# Patient Record
Sex: Female | Born: 1940 | Race: Black or African American | Hispanic: No | Marital: Married | Smoking: Former smoker
Health system: Southern US, Community
[De-identification: ages and names within clinical notes are randomized; demographics above are authoritative.]

## PROBLEM LIST (undated history)

## (undated) DIAGNOSIS — N289 Disorder of kidney and ureter, unspecified: Secondary | ICD-10-CM

## (undated) DIAGNOSIS — I10 Essential (primary) hypertension: Secondary | ICD-10-CM

## (undated) DIAGNOSIS — K8020 Calculus of gallbladder without cholecystitis without obstruction: Secondary | ICD-10-CM

## (undated) DIAGNOSIS — N186 End stage renal disease: Secondary | ICD-10-CM

## (undated) DIAGNOSIS — E785 Hyperlipidemia, unspecified: Secondary | ICD-10-CM

## (undated) DIAGNOSIS — D649 Anemia, unspecified: Secondary | ICD-10-CM

## (undated) DIAGNOSIS — E119 Type 2 diabetes mellitus without complications: Secondary | ICD-10-CM

## (undated) DIAGNOSIS — M109 Gout, unspecified: Secondary | ICD-10-CM

## (undated) DIAGNOSIS — Z992 Dependence on renal dialysis: Secondary | ICD-10-CM

## (undated) DIAGNOSIS — U071 COVID-19: Secondary | ICD-10-CM

## (undated) HISTORY — PX: TUBAL LIGATION: SHX77

---

## 2005-02-28 ENCOUNTER — Ambulatory Visit: Payer: Self-pay

## 2006-04-04 ENCOUNTER — Ambulatory Visit: Payer: Self-pay | Admitting: Unknown Physician Specialty

## 2007-05-21 ENCOUNTER — Ambulatory Visit: Payer: Self-pay

## 2008-07-09 ENCOUNTER — Ambulatory Visit: Payer: Self-pay | Admitting: Family Medicine

## 2009-01-12 ENCOUNTER — Ambulatory Visit: Payer: Self-pay | Admitting: Internal Medicine

## 2009-07-14 ENCOUNTER — Ambulatory Visit: Payer: Self-pay | Admitting: Family Medicine

## 2009-12-14 ENCOUNTER — Ambulatory Visit: Payer: Self-pay | Admitting: Family Medicine

## 2009-12-14 ENCOUNTER — Emergency Department: Payer: Self-pay | Admitting: Emergency Medicine

## 2010-07-19 ENCOUNTER — Ambulatory Visit: Payer: Self-pay | Admitting: Family Medicine

## 2011-10-23 ENCOUNTER — Ambulatory Visit: Payer: Self-pay | Admitting: Family Medicine

## 2011-10-24 ENCOUNTER — Ambulatory Visit: Payer: Self-pay | Admitting: Family Medicine

## 2012-01-23 ENCOUNTER — Ambulatory Visit: Payer: Self-pay | Admitting: Surgery

## 2012-08-30 ENCOUNTER — Ambulatory Visit: Payer: Self-pay | Admitting: Urology

## 2012-10-24 ENCOUNTER — Ambulatory Visit: Payer: Self-pay | Admitting: Surgery

## 2013-04-24 ENCOUNTER — Ambulatory Visit: Payer: Self-pay | Admitting: Surgery

## 2013-10-27 ENCOUNTER — Ambulatory Visit: Payer: Self-pay | Admitting: Surgery

## 2014-02-18 ENCOUNTER — Ambulatory Visit: Payer: Self-pay | Admitting: Family Medicine

## 2015-01-06 ENCOUNTER — Ambulatory Visit: Payer: Self-pay | Admitting: Family Medicine

## 2016-01-26 ENCOUNTER — Encounter: Payer: Self-pay | Admitting: *Deleted

## 2016-01-27 ENCOUNTER — Ambulatory Visit
Admission: RE | Admit: 2016-01-27 | Discharge: 2016-01-27 | Disposition: A | Discharge status: Home / Self Care | Payer: Medicare HMO | Source: Ambulatory Visit | Attending: Gastroenterology | Admitting: Gastroenterology

## 2016-01-27 ENCOUNTER — Encounter: Payer: Self-pay | Admitting: *Deleted

## 2016-01-27 ENCOUNTER — Ambulatory Visit: Payer: Medicare HMO | Admitting: Anesthesiology

## 2016-01-27 ENCOUNTER — Encounter
Admission: RE | Disposition: A | Discharge status: Home / Self Care | Payer: Self-pay | Source: Ambulatory Visit | Attending: Gastroenterology

## 2016-01-27 DIAGNOSIS — E119 Type 2 diabetes mellitus without complications: Secondary | ICD-10-CM | POA: Insufficient documentation

## 2016-01-27 DIAGNOSIS — E785 Hyperlipidemia, unspecified: Secondary | ICD-10-CM | POA: Insufficient documentation

## 2016-01-27 DIAGNOSIS — I10 Essential (primary) hypertension: Secondary | ICD-10-CM | POA: Diagnosis not present

## 2016-01-27 DIAGNOSIS — K8020 Calculus of gallbladder without cholecystitis without obstruction: Secondary | ICD-10-CM | POA: Diagnosis not present

## 2016-01-27 DIAGNOSIS — K5900 Constipation, unspecified: Secondary | ICD-10-CM | POA: Insufficient documentation

## 2016-01-27 DIAGNOSIS — K5730 Diverticulosis of large intestine without perforation or abscess without bleeding: Secondary | ICD-10-CM | POA: Diagnosis not present

## 2016-01-27 DIAGNOSIS — D120 Benign neoplasm of cecum: Secondary | ICD-10-CM | POA: Diagnosis not present

## 2016-01-27 DIAGNOSIS — Z794 Long term (current) use of insulin: Secondary | ICD-10-CM | POA: Insufficient documentation

## 2016-01-27 DIAGNOSIS — D125 Benign neoplasm of sigmoid colon: Secondary | ICD-10-CM | POA: Insufficient documentation

## 2016-01-27 DIAGNOSIS — Z79899 Other long term (current) drug therapy: Secondary | ICD-10-CM | POA: Insufficient documentation

## 2016-01-27 DIAGNOSIS — R195 Other fecal abnormalities: Secondary | ICD-10-CM | POA: Insufficient documentation

## 2016-01-27 DIAGNOSIS — D124 Benign neoplasm of descending colon: Secondary | ICD-10-CM | POA: Diagnosis not present

## 2016-01-27 DIAGNOSIS — M109 Gout, unspecified: Secondary | ICD-10-CM | POA: Diagnosis not present

## 2016-01-27 DIAGNOSIS — D123 Benign neoplasm of transverse colon: Secondary | ICD-10-CM | POA: Diagnosis not present

## 2016-01-27 DIAGNOSIS — Z87891 Personal history of nicotine dependence: Secondary | ICD-10-CM | POA: Insufficient documentation

## 2016-01-27 DIAGNOSIS — Z7982 Long term (current) use of aspirin: Secondary | ICD-10-CM | POA: Insufficient documentation

## 2016-01-27 DIAGNOSIS — D122 Benign neoplasm of ascending colon: Secondary | ICD-10-CM | POA: Diagnosis not present

## 2016-01-27 HISTORY — DX: Calculus of gallbladder without cholecystitis without obstruction: K80.20

## 2016-01-27 HISTORY — PX: COLONOSCOPY WITH PROPOFOL: SHX5780

## 2016-01-27 HISTORY — DX: Gout, unspecified: M10.9

## 2016-01-27 HISTORY — DX: Type 2 diabetes mellitus without complications: E11.9

## 2016-01-27 HISTORY — DX: Hyperlipidemia, unspecified: E78.5

## 2016-01-27 HISTORY — DX: Essential (primary) hypertension: I10

## 2016-01-27 LAB — GLUCOSE, CAPILLARY: Glucose-Capillary: 86 mg/dL (ref 65–99)

## 2016-01-27 SURGERY — COLONOSCOPY WITH PROPOFOL
Anesthesia: General

## 2016-01-27 MED ORDER — MIDAZOLAM HCL 2 MG/2ML IJ SOLN
INTRAMUSCULAR | Status: DC | PRN
  Administered 2016-01-27: 1 mg via INTRAVENOUS

## 2016-01-27 MED ORDER — SODIUM CHLORIDE 0.9 % IV SOLN: INTRAVENOUS | Status: DC

## 2016-01-27 MED ORDER — PROPOFOL 500 MG/50ML IV EMUL
INTRAVENOUS | Status: DC | PRN
  Administered 2016-01-27: 100 ug/kg/min via INTRAVENOUS

## 2016-01-27 MED ORDER — SODIUM CHLORIDE 0.9 % IV SOLN
INTRAVENOUS | Status: DC
  Administered 2016-01-27: 09:00:00 via INTRAVENOUS

## 2016-01-27 MED ORDER — PHENYLEPHRINE HCL 10 MG/ML IJ SOLN
INTRAMUSCULAR | Status: DC | PRN
  Administered 2016-01-27: 50 ug via INTRAVENOUS

## 2016-01-27 MED ORDER — FENTANYL CITRATE (PF) 100 MCG/2ML IJ SOLN
INTRAMUSCULAR | Status: DC | PRN
  Administered 2016-01-27: 50 ug via INTRAVENOUS

## 2016-01-27 NOTE — Anesthesia Preprocedure Evaluation (Signed)
Anesthesia Evaluation  Patient identified by MRN, date of birth, ID band Patient awake    Reviewed: Allergy & Precautions, H&P , NPO status , Patient's Chart, lab work & pertinent test results  History of Anesthesia Complications Negative for: history of anesthetic complications  Airway Mallampati: III  TM Distance: >3 FB Neck ROM: limited    Dental  (+) Poor Dentition, Missing, Upper Dentures, Lower Dentures   Pulmonary neg shortness of breath, former smoker,    Pulmonary exam normal breath sounds clear to auscultation       Cardiovascular Exercise Tolerance: Good hypertension, (-) angina(-) Past MI and (-) DOE Normal cardiovascular exam Rhythm:regular Rate:Normal     Neuro/Psych negative neurological ROS  negative psych ROS   GI/Hepatic negative GI ROS, Neg liver ROS,   Endo/Other  diabetes, Type 2  Renal/GU negative Renal ROS  negative genitourinary   Musculoskeletal   Abdominal   Peds  Hematology negative hematology ROS (+)   Anesthesia Other Findings Past Medical History:   Diabetes mellitus without complication (HCC)                 Gout                                                         Hyperlipidemia                                               Hypertension                                                 Gall stone                                                  Past Surgical History:   TUBAL LIGATION                                               BMI    Body Mass Index   32.91 kg/m 2    Signs and symptoms suggestive of sleep apnea    Reproductive/Obstetrics negative OB ROS                             Anesthesia Physical Anesthesia Plan  ASA: III  Anesthesia Plan: General   Post-op Pain Management:    Induction:   Airway Management Planned:   Additional Equipment:   Intra-op Plan:   Post-operative Plan:   Informed Consent: I have reviewed the  patients History and Physical, chart, labs and discussed the procedure including the risks, benefits and alternatives for the proposed anesthesia with the patient or authorized representative who has indicated his/her understanding and acceptance.   Dental Advisory Given  Plan Discussed with: Anesthesiologist, CRNA and  Surgeon  Anesthesia Plan Comments:         Anesthesia Quick Evaluation

## 2016-01-27 NOTE — Anesthesia Procedure Notes (Signed)
Performed by: Vaughan Sine Pre-anesthesia Checklist: Patient identified, Emergency Drugs available, Patient being monitored, Suction available and Timeout performed Patient Re-evaluated:Patient Re-evaluated prior to inductionOxygen Delivery Method: Nasal cannula Preoxygenation: Pre-oxygenation with 100% oxygen Intubation Type: IV induction Placement Confirmation: CO2 detector and positive ETCO2

## 2016-01-27 NOTE — Transfer of Care (Signed)
Immediate Anesthesia Transfer of Care Note  Patient: Krista Gordon  Procedure(s) Performed: Procedure(s): COLONOSCOPY WITH PROPOFOL (N/A)  Patient Location: PACU  Anesthesia Type:General  Level of Consciousness: awake and sedated  Airway & Oxygen Therapy: Patient Spontanous Breathing and Patient connected to nasal cannula oxygen  Post-op Assessment: Report given to RN and Post -op Vital signs reviewed and stable  Post vital signs: Reviewed and stable  Last Vitals:  Filed Vitals:   01/27/16 0820  BP: 160/75  Pulse: 77  Temp: 35.9 C  Resp: 18    Complications: No apparent anesthesia complications

## 2016-01-27 NOTE — Anesthesia Postprocedure Evaluation (Signed)
Anesthesia Post Note  Patient: Krista Gordon  Procedure(s) Performed: Procedure(s) (LRB): COLONOSCOPY WITH PROPOFOL (N/A)  Patient location during evaluation: Endoscopy Anesthesia Type: General Level of consciousness: awake and alert Pain management: pain level controlled Vital Signs Assessment: post-procedure vital signs reviewed and stable Respiratory status: spontaneous breathing, nonlabored ventilation, respiratory function stable and patient connected to nasal cannula oxygen Cardiovascular status: blood pressure returned to baseline and stable Postop Assessment: no signs of nausea or vomiting Anesthetic complications: no    Last Vitals:  Filed Vitals:   01/27/16 0936 01/27/16 0946  BP: 122/65 121/63  Pulse: 71 66  Temp:    Resp: 20 15    Last Pain: There were no vitals filed for this visit.               Precious Haws Piscitello

## 2016-01-27 NOTE — H&P (Signed)
    Primary Care Physician:  Marguerita Merles, MD Primary Gastroenterologist:  Dr. Candace Cruise  Pre-Procedure History & Physical: HPI:  Krista Gordon is a 75 y.o. female is here for an colonoscopy  Past Medical History  Diagnosis Date  . Diabetes mellitus without complication ([place])   . Gout   . Hyperlipidemia   . Hypertension   . Gall stone     Past Surgical History  Procedure Laterality Date  . Tubal ligation      Prior to Admission medications   Medication Sig Start Date End Date Taking? Authorizing Provider  alendronate (FOSAMAX) 70 MG tablet Take 70 mg by mouth once a week. Take with a full glass of water on an empty stomach.   Yes Historical Provider, MD  amLODipine (NORVASC) 5 MG tablet Take 5 mg by mouth daily.   Yes Historical Provider, MD  aspirin (ASPIRIN EC) 81 MG EC tablet Take 81 mg by mouth daily. Swallow whole.   Yes Historical Provider, MD  atorvastatin (LIPITOR) 40 MG tablet Take 40 mg by mouth daily.   Yes Historical Provider, MD  cetirizine (ZYRTEC) 10 MG tablet Take 10 mg by mouth daily.   Yes Historical Provider, MD  colchicine 0.6 MG tablet Take 0.6 mg by mouth daily.   Yes Historical Provider, MD  insulin glargine (LANTUS) 100 UNIT/ML injection Inject into the skin at bedtime.   Yes Historical Provider, MD  linagliptin (TRADJENTA) 5 MG TABS tablet Take 5 mg by mouth daily.   Yes Historical Provider, MD  quinapril (ACCUPRIL) 40 MG tablet Take 40 mg by mouth at bedtime.   Yes Historical Provider, MD    Allergies as of 01/18/2016  . (Not on File)    History reviewed. No pertinent family history.  Social History   Social History  . Marital Status: Married    Spouse Name: N/A  . Number of Children: N/A  . Years of Education: N/A   Occupational History  . Not on file.   Social History Main Topics  . Smoking status: Former Research scientist (life sciences)  . Smokeless tobacco: Never Used  . Alcohol Use: No  . Drug Use: No  . Sexual Activity: Not on file   Other Topics Concern   . Not on file   Social History Narrative    Review of Systems: See HPI, otherwise negative ROS  Physical Exam: BP 160/75 mmHg  Pulse 77  Temp(Src) 96.7 F (35.9 C) (Tympanic)  Resp 18  Ht 5\' 2"  (1.575 m)  Wt 81.647 kg (180 lb)  BMI 32.91 kg/m2  SpO2 100% General:   Alert,  pleasant and cooperative in NAD Head:  Normocephalic and atraumatic. Neck:  Supple; no masses or thyromegaly. Lungs:  Clear throughout to auscultation.    Heart:  Regular rate and rhythm. Abdomen:  Soft, nontender and nondistended. Normal bowel sounds, without guarding, and without rebound.   Neurologic:  Alert and  oriented x4;  grossly normal neurologically.  Impression/Plan: Krista Gordon is here for an colonoscopy to be performed for heme positive stool, constipation  Risks, benefits, limitations, and alternatives regarding colonoscopy have been reviewed with the patient.  Questions have been answered.  All parties agreeable.   Calhoun Reichardt, Lupita Dawn, MD  01/27/2016, 8:43 AM

## 2016-01-27 NOTE — Op Note (Signed)
[facility] Gastroenterology Patient Name: Krista Gordon Procedure Date: 01/27/2016 8:45 AM MRN: [id number] Account #: [id number] Date of Birth: 04/25/1941 Admit Type: Outpatient Age: 75 Room: [facility] ENDO ROOM 4 Gender: Female Note Status: Finalized Procedure:            Colonoscopy Indications:          Heme positive stool, Constipation Providers:            Lupita Dawn. Candace Cruise, MD Referring MD:         Forest Gleason Md, MD (Referring MD) Medicines:            Monitored Anesthesia Care Complications:        No immediate complications. Procedure:            Pre-Anesthesia Assessment:                       - Prior to the procedure, a History and Physical was                        performed, and patient medications, allergies and                        sensitivities were reviewed. The patient's tolerance of                        previous anesthesia was reviewed.                       - The risks and benefits of the procedure and the                        sedation options and risks were discussed with the                        patient. All questions were answered and informed                        consent was obtained.                       - After reviewing the risks and benefits, the patient                        was deemed in satisfactory condition to undergo the                        procedure.                       After obtaining informed consent, the colonoscope was                        passed under direct vision. Throughout the procedure,                        the patient's blood pressure, pulse, and oxygen                        saturations were monitored continuously. The  Colonoscope was introduced through the anus and                        advanced to the the cecum, identified by appendiceal                        orifice and ileocecal valve. The colonoscopy was                        performed without difficulty. The patient tolerated  the                        procedure well. The quality of the bowel preparation                        was fair. Findings:      Multiple small and large-mouthed diverticula were found in the sigmoid       colon.      A medium polyp was found in the cecum. The polyp was sessile. The polyp       was removed with a hot snare. Resection and retrieval were complete.      A medium polyp was found in the ascending colon. The polyp was sessile.       The polyp was removed with a hot snare. Resection and retrieval were       complete. To prevent bleeding after the polypectomy, one hemostatic clip       was successfully placed (MR conditional). There was no bleeding at the       end of the procedure.      Two sessile polyps were found in the hepatic flexure. The polyps were       small in size. These polyps were removed with a hot snare. Resection and       retrieval were complete.      A medium polyp was found in the descending colon. The polyp was sessile.       The polyp was removed with a hot snare. Resection was complete, but the       polyp tissue was not retrieved.      A medium polyp was found in the sigmoid colon. The polyp was       pedunculated. The polyp was removed with a hot snare. Resection and       retrieval were complete.      The exam was otherwise without abnormality. Impression:           - Preparation of the colon was fair.                       - Diverticulosis in the sigmoid colon.                       - One medium polyp in the cecum, removed with a hot                        snare. Resected and retrieved.                       - One medium polyp in the ascending colon, removed with  a hot snare. Resected and retrieved. Clip (MR                        conditional) was placed.                       - Two small polyps at the hepatic flexure, removed with                        a hot snare. Resected and retrieved.                       - One medium  polyp in the descending colon, removed                        with a hot snare. Complete resection. Polyp tissue not                        retrieved.                       - One medium polyp in the sigmoid colon, removed with a                        hot snare. Resected and retrieved.                       - The examination was otherwise normal. Recommendation:       - Discharge patient to home.                       - Await pathology results.                       - Repeat colonoscopy in 1 year for surveillance based                        on pathology results.                       - The findings and recommendations were discussed with                        the patient. Procedure Code(s):    --- Professional ---                       [phone], Colonoscopy, flexible; with removal of tumor(s),                        polyp(s), or other lesion(s) by snare technique Diagnosis Code(s):    --- Professional ---                       D12.0, Benign neoplasm of cecum                       D12.2, Benign neoplasm of ascending colon                       D12.4, Benign neoplasm of descending colon  D12.5, Benign neoplasm of sigmoid colon                       D12.3, Benign neoplasm of transverse colon (hepatic                        flexure or splenic flexure)                       R19.5, Other fecal abnormalities                       K59.00, Constipation, unspecified                       K57.30, Diverticulosis of large intestine without                        perforation or abscess without bleeding CPT copyright 2016 American Medical Association. All rights reserved. The codes documented in this report are preliminary and upon coder review may  be revised to meet current compliance requirements. Hulen Luster, MD 01/27/2016 9:14:10 AM This report has been signed electronically. Number of Addenda: 0 Note Initiated On: 01/27/2016 8:45 AM Scope Withdrawal Time: 0 hours 16 minutes  23 seconds  Total Procedure Duration: 0 hours 19 minutes 49 seconds       [facility]

## 2016-01-28 LAB — SURGICAL PATHOLOGY

## 2016-01-31 ENCOUNTER — Encounter: Payer: Self-pay | Admitting: Gastroenterology

## 2016-02-11 IMAGING — US US BREAST*L* LIMITED INC AXILLA
1 series · 5 of 5 positions shown · non-contrast
Comparison: With priors.

ACR Breast Density Category a: The breast tissue is almost entirely
fatty.

CLINICAL DATA: Follow up of a probable cyst in the 8 o'clock
retroareolar region of the left breast.

EXAM:
DIGITAL DIAGNOSTIC BILATERAL MAMMOGRAM WITH 3D TOMOSYNTHESIS WITH
CAD
ULTRASOUND LEFT BREAST

[Series 1: us breast*left* limited inc axilla · 0.08mm/px · 5 of 5 slices shown]
[im 1/5]
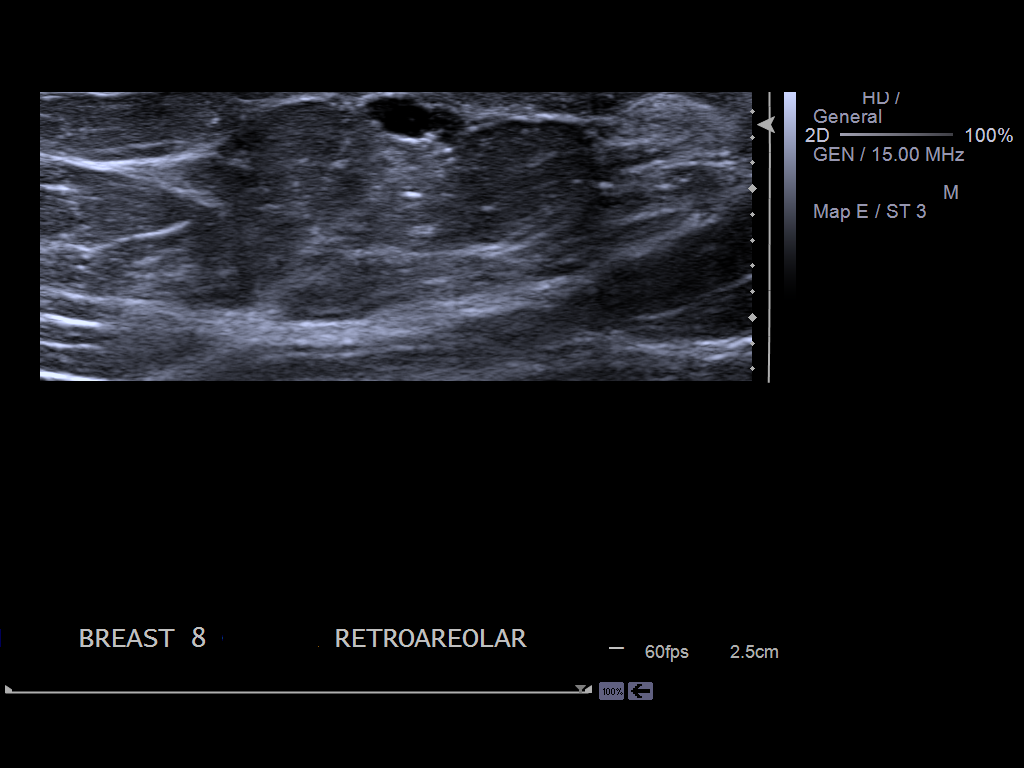
[im 2/5]
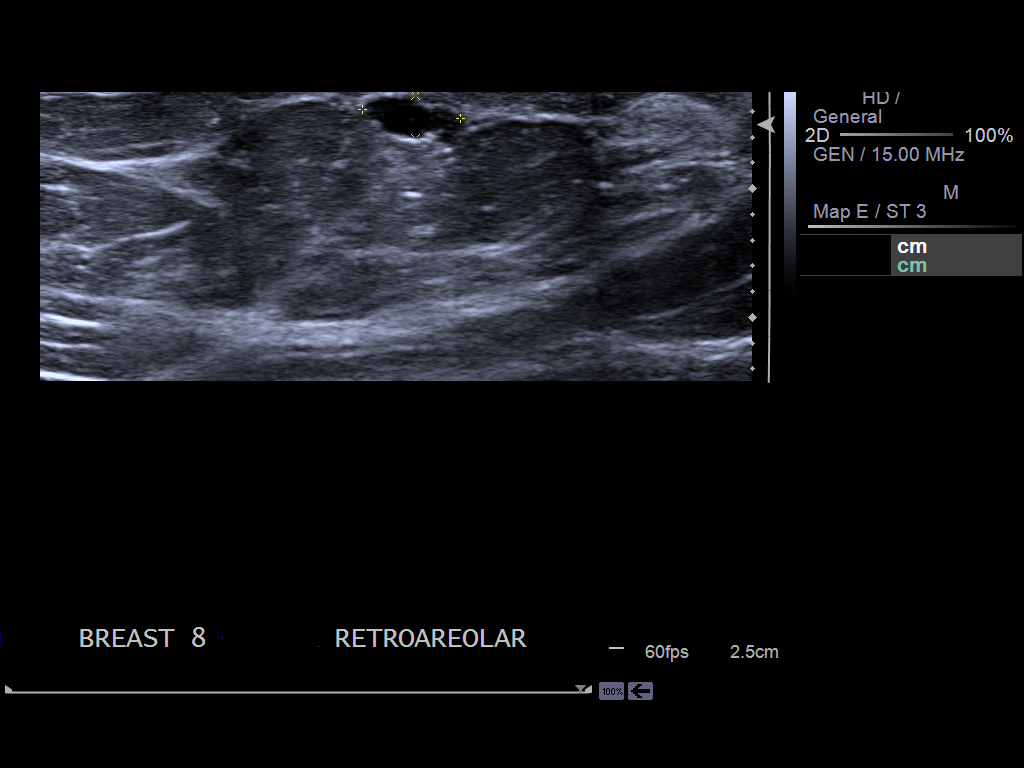
[im 3/5]
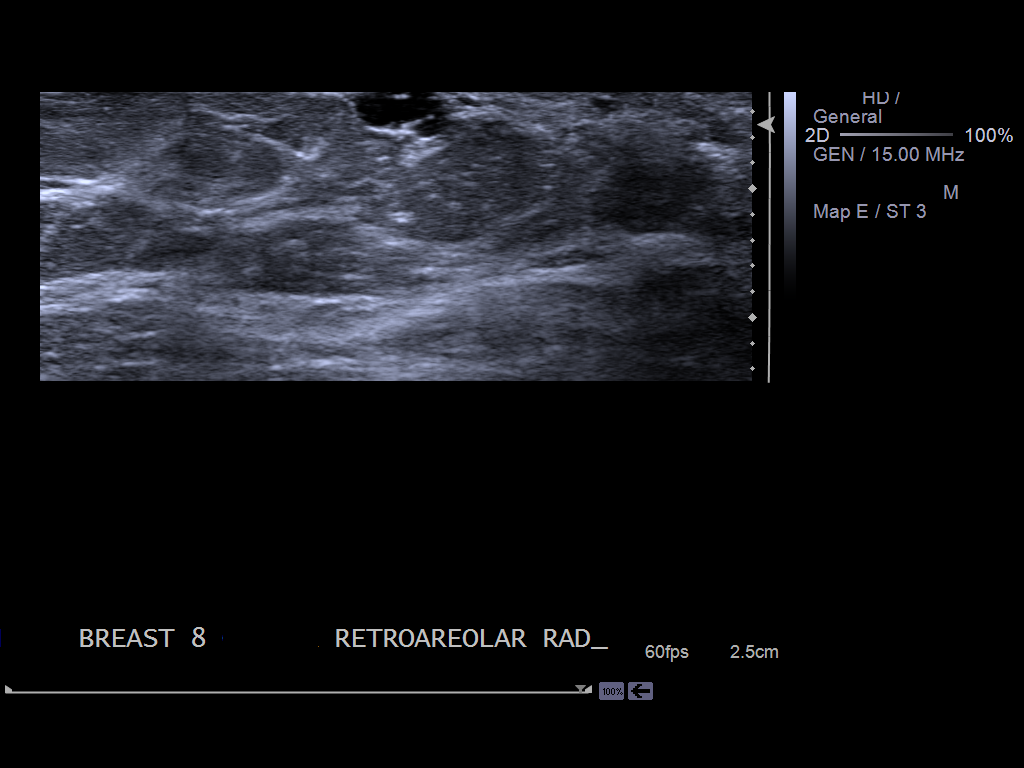
[im 4/5]
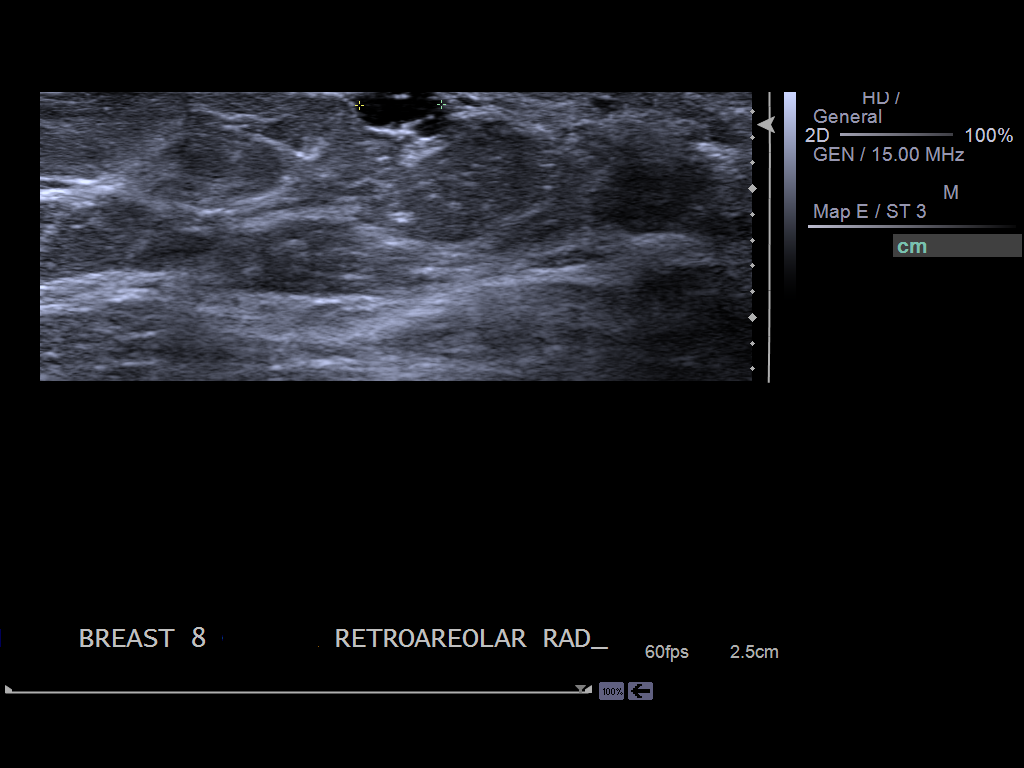
[im 5/5]
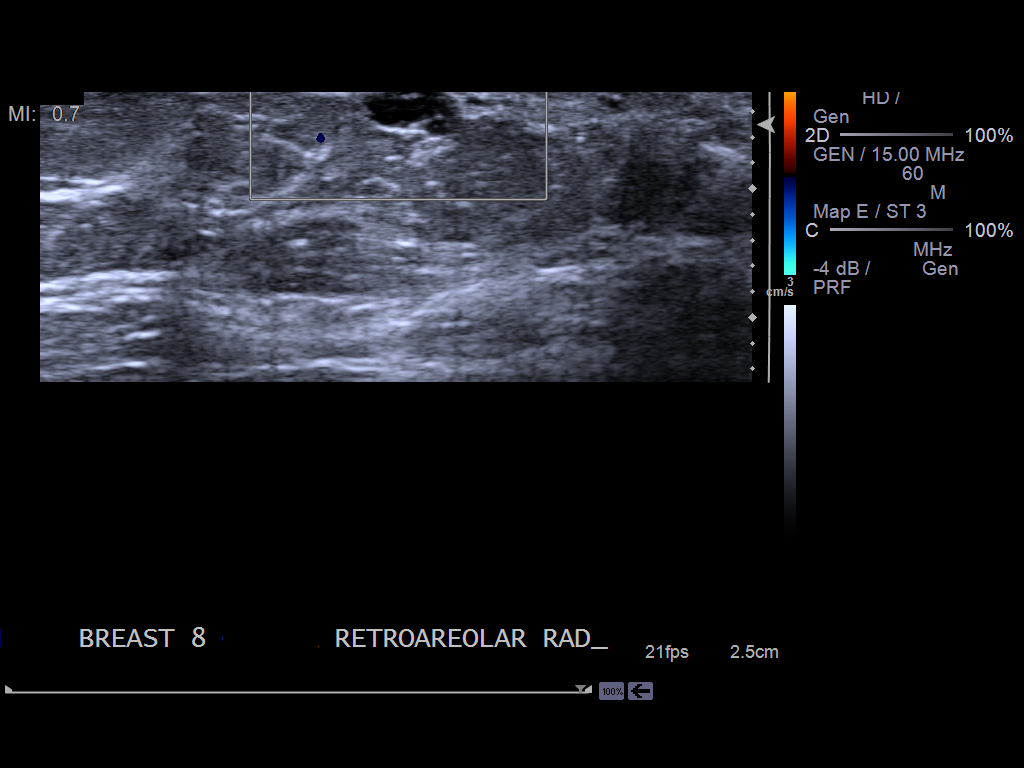

[5 of 5 positions shown; findings below may reference images not displayed]

FINDINGS: 7 mm nodule in the 8 o'clock periareolar region of the left breast
is stable compared to images dated 10/23/2011. No suspicious mass or
malignant type microcalcifications identified in either breast.

Mammographic images were processed with CAD.

On physical exam, I do not palpate a mass in the 8 o'clock
retroareolar region of the left breast.

Targeted ultrasound is performed, showing a stable cyst in the left
breast at 8 o'clock in the retroareolar region measuring 7 x 3 x 6
mm. It is unchanged from ultrasounds dating back to 10/24/2011.
IMPRESSION: Stable left breast cyst. No evidence of malignancy in either breast.

RECOMMENDATION:
Bilateral screening mammogram in 1 year is recommended.

I have discussed the findings and recommendations with the patient.
Results were also provided in writing at the conclusion of the
visit. If applicable, a reminder letter will be sent to the patient
regarding the next appointment.

BI-RADS CATEGORY  2: Benign.

## 2016-02-11 IMAGING — MG MM DIGITAL DIAGNOSTIC BILAT W/ TOMO W/ CAD
8 of 17 series · 8 of 40 positions shown · non-contrast
Comparison: With priors.

ACR Breast Density Category a: The breast tissue is almost entirely
fatty.

CLINICAL DATA: Follow up of a probable cyst in the 8 o'clock
retroareolar region of the left breast.

EXAM:
DIGITAL DIAGNOSTIC BILATERAL MAMMOGRAM WITH 3D TOMOSYNTHESIS WITH
CAD
ULTRASOUND LEFT BREAST

[R ML]
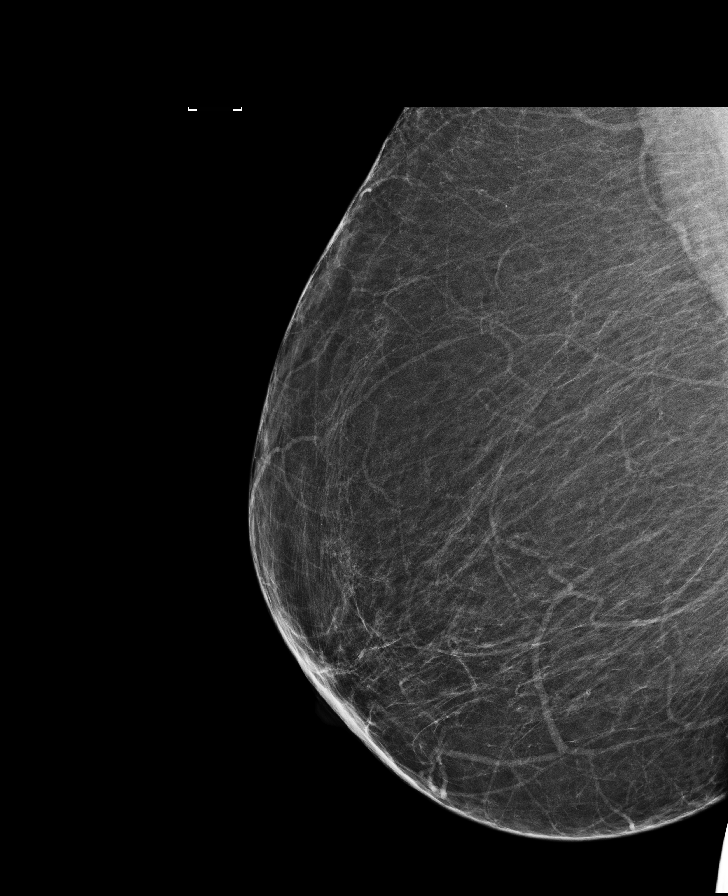

[R MLO synth-2D]
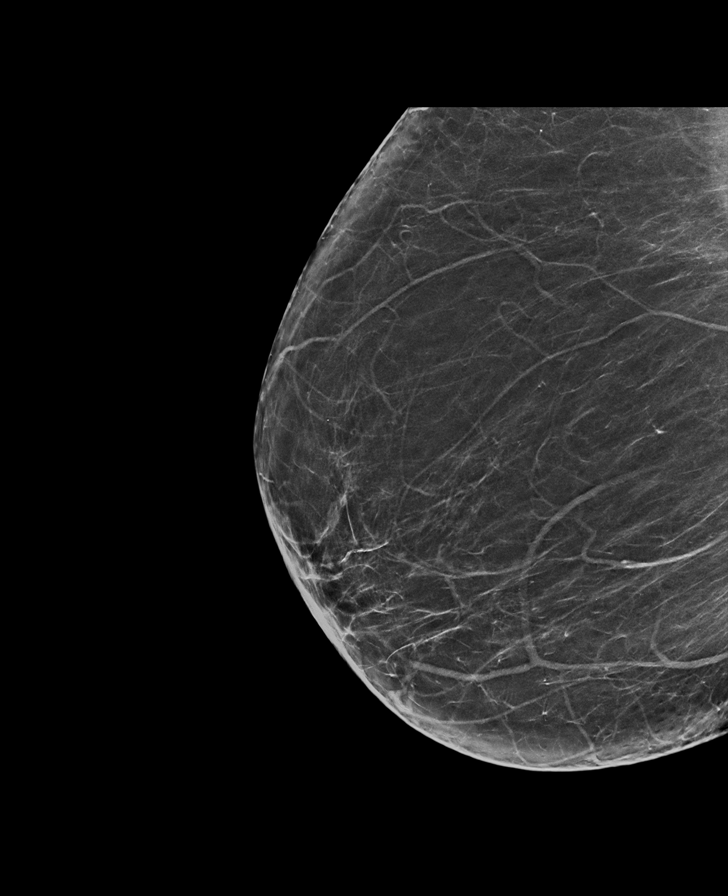

[L MLO synth-2D]
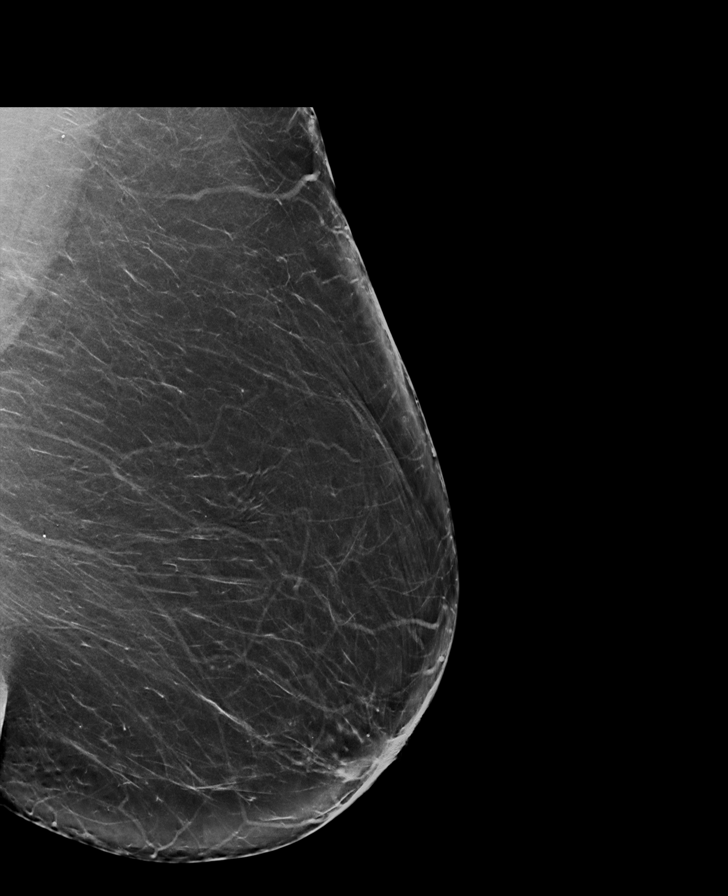

[R CC synth-2D]
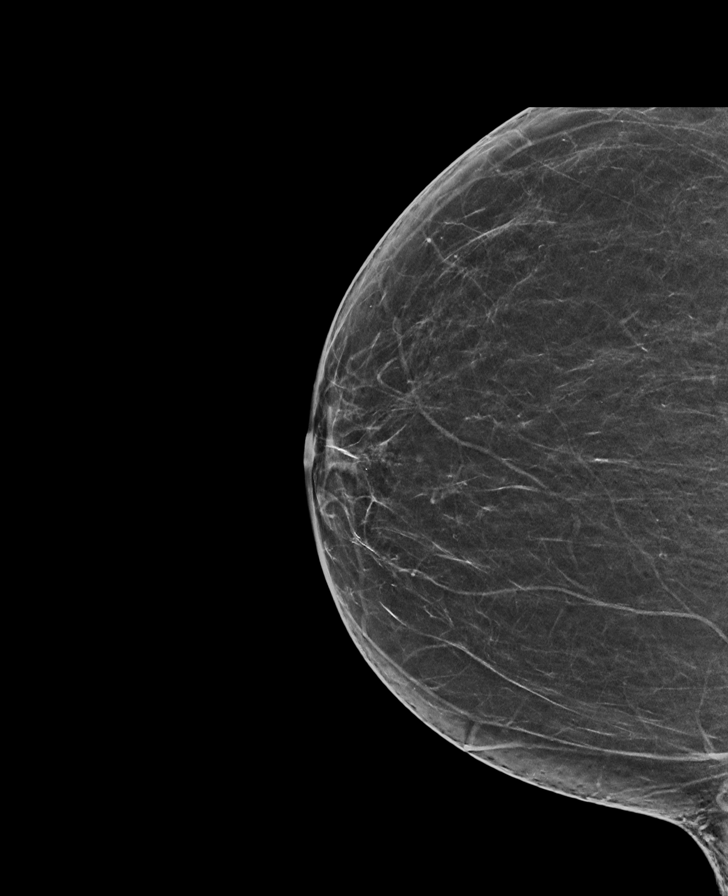

[R MLO]
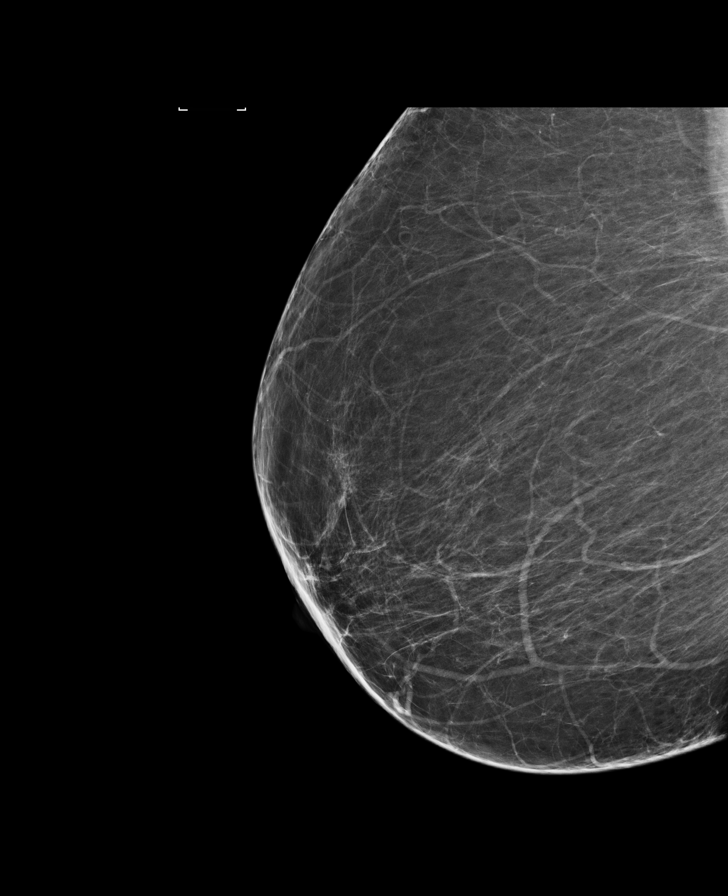

[L CC synth-2D]
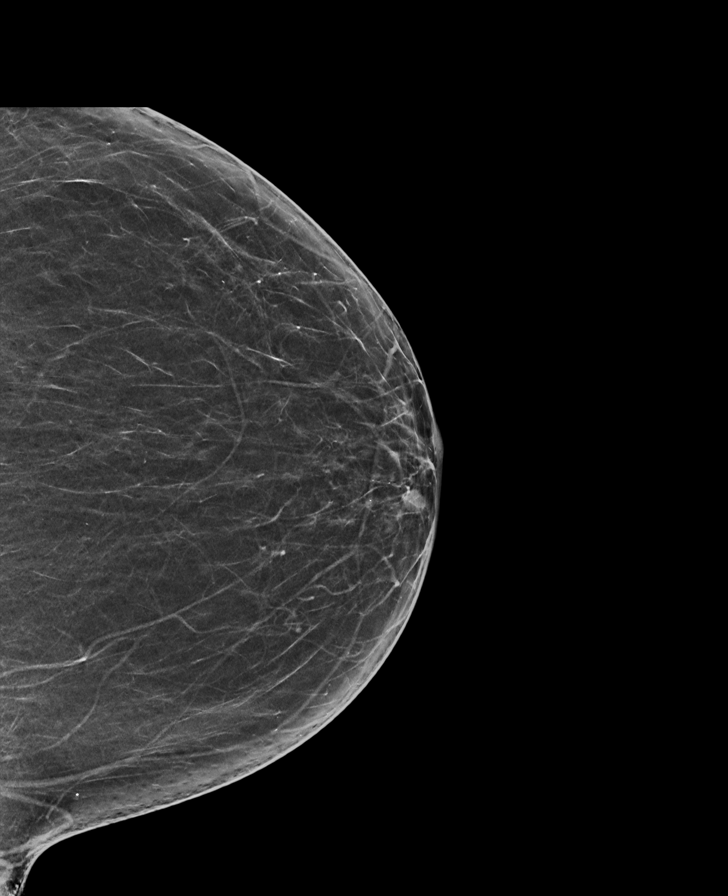

[R ML synth-2D]
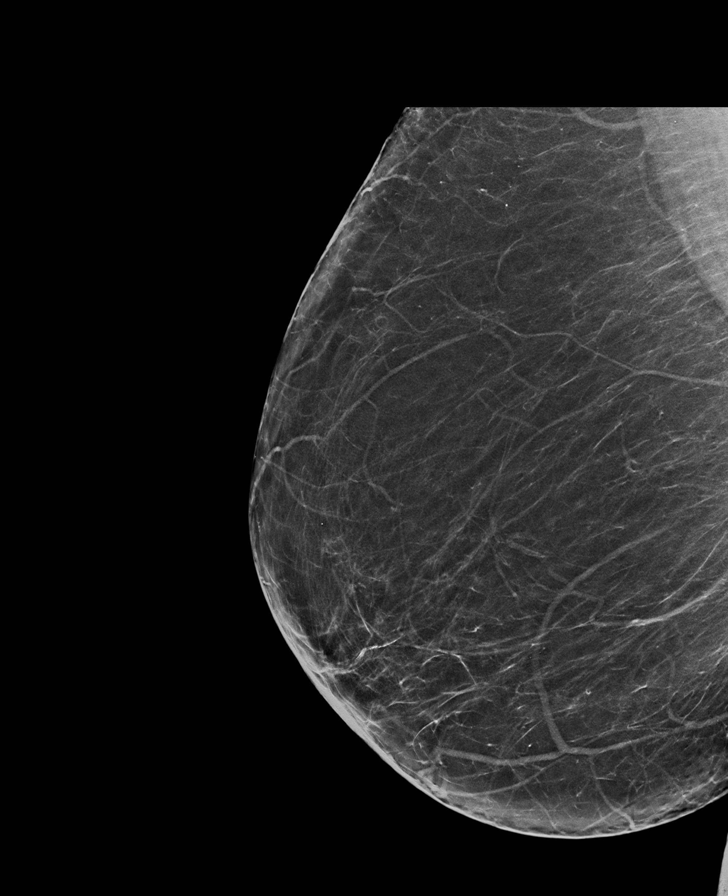

[L MLO]
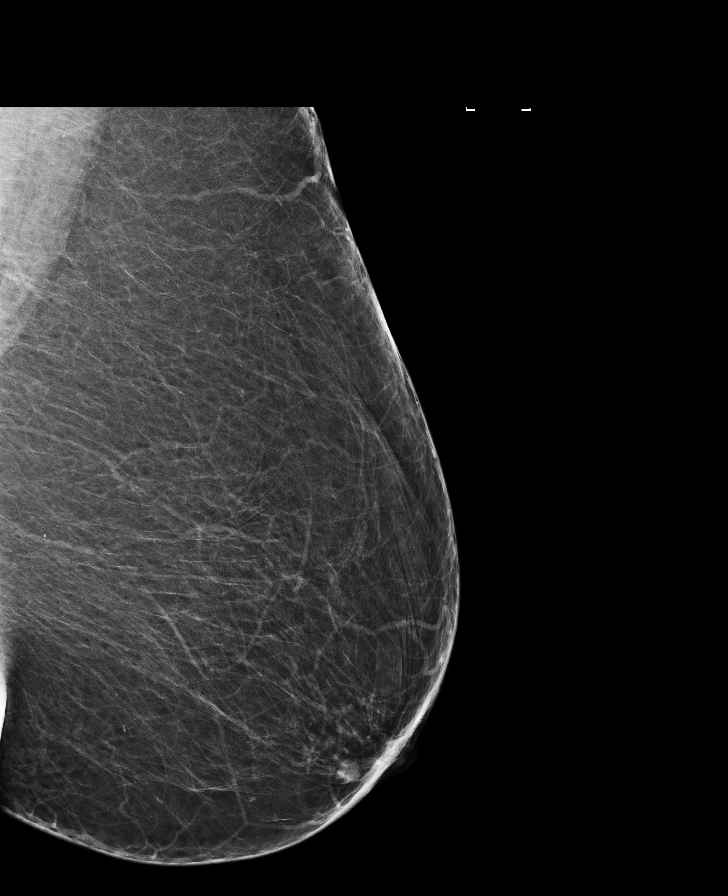

[8 of 40 positions shown; findings below may reference images not displayed]

FINDINGS: 7 mm nodule in the 8 o'clock periareolar region of the left breast
is stable compared to images dated 10/23/2011. No suspicious mass or
malignant type microcalcifications identified in either breast.

Mammographic images were processed with CAD.

On physical exam, I do not palpate a mass in the 8 o'clock
retroareolar region of the left breast.

Targeted ultrasound is performed, showing a stable cyst in the left
breast at 8 o'clock in the retroareolar region measuring 7 x 3 x 6
mm. It is unchanged from ultrasounds dating back to 10/24/2011.
IMPRESSION: Stable left breast cyst. No evidence of malignancy in either breast.

RECOMMENDATION:
Bilateral screening mammogram in 1 year is recommended.

I have discussed the findings and recommendations with the patient.
Results were also provided in writing at the conclusion of the
visit. If applicable, a reminder letter will be sent to the patient
regarding the next appointment.

BI-RADS CATEGORY  2: Benign.

## 2016-12-04 ENCOUNTER — Other Ambulatory Visit: Payer: Self-pay | Admitting: Family Medicine

## 2016-12-04 DIAGNOSIS — Z1231 Encounter for screening mammogram for malignant neoplasm of breast: Secondary | ICD-10-CM

## 2016-12-29 ENCOUNTER — Ambulatory Visit
Admission: RE | Admit: 2016-12-29 | Discharge: 2016-12-29 | Disposition: A | Discharge status: Home / Self Care | Payer: 59 | Source: Ambulatory Visit | Attending: Family Medicine | Admitting: Family Medicine

## 2016-12-29 DIAGNOSIS — Z1231 Encounter for screening mammogram for malignant neoplasm of breast: Secondary | ICD-10-CM | POA: Insufficient documentation

## 2017-03-20 ENCOUNTER — Other Ambulatory Visit: Payer: Self-pay | Admitting: Family Medicine

## 2017-03-20 DIAGNOSIS — Z13820 Encounter for screening for osteoporosis: Secondary | ICD-10-CM

## 2017-03-20 DIAGNOSIS — M8580 Other specified disorders of bone density and structure, unspecified site: Secondary | ICD-10-CM

## 2017-05-10 ENCOUNTER — Ambulatory Visit
Admission: RE | Admit: 2017-05-10 | Discharge: 2017-05-10 | Disposition: A | Discharge status: Home / Self Care | Payer: Medicare Other | Source: Ambulatory Visit | Attending: Family Medicine | Admitting: Family Medicine

## 2017-05-10 DIAGNOSIS — M8580 Other specified disorders of bone density and structure, unspecified site: Secondary | ICD-10-CM

## 2017-05-10 DIAGNOSIS — Z13820 Encounter for screening for osteoporosis: Secondary | ICD-10-CM | POA: Diagnosis present

## 2017-05-10 DIAGNOSIS — M8588 Other specified disorders of bone density and structure, other site: Secondary | ICD-10-CM | POA: Diagnosis not present

## 2017-10-12 ENCOUNTER — Other Ambulatory Visit: Payer: Self-pay

## 2017-10-12 ENCOUNTER — Emergency Department: Payer: Medicare Other

## 2017-10-12 ENCOUNTER — Encounter: Payer: Self-pay | Admitting: Emergency Medicine

## 2017-10-12 ENCOUNTER — Emergency Department
Admission: EM | Admit: 2017-10-12 | Discharge: 2017-10-12 | Disposition: A | Discharge status: Home / Self Care | Payer: Medicare Other | Attending: Emergency Medicine | Admitting: Emergency Medicine

## 2017-10-12 DIAGNOSIS — W109XXA Fall (on) (from) unspecified stairs and steps, initial encounter: Secondary | ICD-10-CM | POA: Diagnosis not present

## 2017-10-12 DIAGNOSIS — S6991XA Unspecified injury of right wrist, hand and finger(s), initial encounter: Secondary | ICD-10-CM | POA: Diagnosis present

## 2017-10-12 DIAGNOSIS — I10 Essential (primary) hypertension: Secondary | ICD-10-CM | POA: Diagnosis not present

## 2017-10-12 DIAGNOSIS — Y929 Unspecified place or not applicable: Secondary | ICD-10-CM | POA: Diagnosis not present

## 2017-10-12 DIAGNOSIS — Y9389 Activity, other specified: Secondary | ICD-10-CM | POA: Insufficient documentation

## 2017-10-12 DIAGNOSIS — Z794 Long term (current) use of insulin: Secondary | ICD-10-CM | POA: Diagnosis not present

## 2017-10-12 DIAGNOSIS — S52591A Other fractures of lower end of right radius, initial encounter for closed fracture: Secondary | ICD-10-CM

## 2017-10-12 DIAGNOSIS — Z7982 Long term (current) use of aspirin: Secondary | ICD-10-CM | POA: Insufficient documentation

## 2017-10-12 DIAGNOSIS — Z79899 Other long term (current) drug therapy: Secondary | ICD-10-CM | POA: Diagnosis not present

## 2017-10-12 DIAGNOSIS — Y999 Unspecified external cause status: Secondary | ICD-10-CM | POA: Diagnosis not present

## 2017-10-12 DIAGNOSIS — E119 Type 2 diabetes mellitus without complications: Secondary | ICD-10-CM | POA: Diagnosis not present

## 2017-10-12 DIAGNOSIS — Z87891 Personal history of nicotine dependence: Secondary | ICD-10-CM | POA: Insufficient documentation

## 2017-10-12 MED ORDER — TRAMADOL HCL 50 MG PO TABS: 25.0000 mg | ORAL_TABLET | Freq: Four times a day (QID) | ORAL | 0 refills | Status: DC | PRN

## 2017-10-12 NOTE — ED Triage Notes (Signed)
Pt to ED via POV with c/o RT wrist pain after mechanical fall today. Pt denies any head injury or LOC. No deformity or swelling noted to wrist at this time, pt ambulatory A&OX4, VS stable

## 2017-10-12 NOTE — ED Provider Notes (Signed)
[facility] Emergency Department Provider Note  ____________________________________________  Time seen: Approximately 5:24 PM  I have reviewed the triage vital signs and the nursing notes.   HISTORY  Chief Complaint Wrist Pain    HPI Krista Gordon is a 76 y.o. female that presents to the emergency department for evaluation of right wrist pain after fall today.  Patient states that she missed the last step, which caused her to fall.  She landed on her right wrist.  She has been moving her wrist normally but with pain.  Pain is worse when trying to grab at an object.  Pain starts at the base of her thumb and extends up her forearm. She did not hit her head or lose consciousness.  She denies any additional injuries or concerns. She drover herself to the ED. She has not taken anything for pain because "ibuprofen and tylenol do not work."  She lives with her daughter and granddaughter.  No numbness, tingling.  Past Medical History:  Diagnosis Date  . Diabetes mellitus without complication ([place])   . Gall stone   . Gout   . Hyperlipidemia   . Hypertension     There are no active problems to display for this patient.   Past Surgical History:  Procedure Laterality Date  . COLONOSCOPY WITH PROPOFOL N/A 01/27/2016   Procedure: COLONOSCOPY WITH PROPOFOL;  Surgeon: Hulen Luster, MD;  Location: [facility] ENDOSCOPY;  Service: Gastroenterology;  Laterality: N/A;  . TUBAL LIGATION      Prior to Admission medications   Medication Sig Start Date End Date Taking? Authorizing Provider  alendronate (FOSAMAX) 70 MG tablet Take 70 mg by mouth once a week. Take with a full glass of water on an empty stomach.    [provider]  amLODipine (NORVASC) 5 MG tablet Take 5 mg by mouth daily.    [provider]  aspirin (ASPIRIN EC) 81 MG EC tablet Take 81 mg by mouth daily. Swallow whole.    [provider]  atorvastatin (LIPITOR) 40 MG tablet Take 40 mg by  mouth daily.    [provider]  cetirizine (ZYRTEC) 10 MG tablet Take 10 mg by mouth daily.    [provider]  colchicine 0.6 MG tablet Take 0.6 mg by mouth daily.    [provider]  insulin glargine (LANTUS) 100 UNIT/ML injection Inject into the skin at bedtime.    [provider]  linagliptin (TRADJENTA) 5 MG TABS tablet Take 5 mg by mouth daily.    [provider]  quinapril (ACCUPRIL) 40 MG tablet Take 40 mg by mouth at bedtime.    [provider]  traMADol (ULTRAM) 50 MG tablet Take 0.5 tablets (25 mg total) by mouth every 6 (six) hours as needed. 10/12/17 10/12/18  Laban Emperor, PA-C    Allergies Patient has no known allergies.  Family History  Problem Relation Age of Onset  . Breast cancer Sister 63    Social History Social History   Tobacco Use  . Smoking status: Former Research scientist (life sciences)  . Smokeless tobacco: Never Used  Substance Use Topics  . Alcohol use: No  . Drug use: No     Review of Systems  Cardiovascular: No chest pain. Respiratory:  No SOB. Musculoskeletal: Positive for wrist pain. Skin: Negative for rash, abrasions, lacerations, ecchymosis. Neurological: Negative for numbness or tingling   ____________________________________________   PHYSICAL EXAM:  VITAL SIGNS: ED Triage Vitals  Enc Vitals Group  BP 10/12/17 1647 140/62     Pulse Rate 10/12/17 1647 93     Resp 10/12/17 1647 16     Temp 10/12/17 1647 98.6 F (37 C)     Temp Source 10/12/17 1647 Oral     SpO2 10/12/17 1647 97 %     Weight 10/12/17 1648 170 lb (77.1 kg)     Height 10/12/17 1648 5\' 2"  (1.575 m)     Head Circumference --      Peak Flow --      Pain Score 10/12/17 1647 7     Pain Loc --      Pain Edu? --      Excl. in [place]? --      Constitutional: Alert and oriented. Well appearing and in no acute distress. Eyes: Conjunctivae are normal. PERRL. EOMI. Head: Atraumatic. ENT:      Ears:      Nose: No  congestion/rhinnorhea.      Mouth/Throat: Mucous membranes are moist.  Neck: No stridor.   Cardiovascular: Normal rate, regular rhythm.  Good peripheral circulation.  Symmetric radial pulses bilaterally. Respiratory: Normal respiratory effort without tachypnea or retractions. Lungs CTAB. Good air entry to the bases with no decreased or absent breath sounds. Musculoskeletal: Full range of motion to all extremities. No gross deformities appreciated.  Mild tenderness to palpation over radial side of right wrist at base of thumb.  No visible swelling or bruising.  Strength 5 out of 5 in fingers and wrist. Neurologic:  Normal speech and language. No gross focal neurologic deficits are appreciated.  Skin:  Skin is warm, dry and intact. No rash noted.   ____________________________________________   LABS (all labs ordered are listed, but only abnormal results are displayed)  Labs Reviewed - No data to display ____________________________________________  EKG   ____________________________________________  RADIOLOGY Robinette Haines, personally viewed and evaluated these images (plain radiographs) as part of my medical decision making, as well as reviewing the written report by the radiologist.  Dg Wrist Complete Right  Result Date: 10/12/2017 CLINICAL DATA:  Right wrist pain status post fall today. EXAM: RIGHT WRIST - COMPLETE 3+ VIEW COMPARISON:  None. FINDINGS: There is cortical discontinuity of the distal aspect of the radius consistent with fracture. There is no dislocation. IMPRESSION: Fracture of the distal radius. Electronically Signed   By: Abelardo Diesel M.D.   On: 10/12/2017 17:37    ____________________________________________    PROCEDURES  Procedure(s) performed:    Procedures    Medications - No data to display   ____________________________________________   INITIAL IMPRESSION / ASSESSMENT AND PLAN / ED COURSE  Pertinent labs & imaging results that were  available during my care of the patient were reviewed by me and considered in my medical decision making (see chart for details).  Review of the [place] CSRS was performed in accordance of the [place] prior to dispensing any controlled drugs.   Patient's diagnosis is consistent with radial fracture is vital signs and exam are reassuring.  X-ray indicates cortical irregularity consistent with fracture.  Thumb spica splint was placed and sling was given.  Cap refill less than 3 seconds after splint placement.  Patient will be discharged home with prescriptions for a low dose of tramadol. Risks were discussed. Patient is to follow up with orthopedics as directed. Patient is given ED precautions to return to the ED for any worsening or new symptoms.     ____________________________________________  FINAL CLINICAL IMPRESSION(S) / ED DIAGNOSES  Final diagnoses:  Other closed fracture of distal end of right radius, initial encounter      NEW MEDICATIONS STARTED DURING THIS VISIT:  ED Discharge Orders        Ordered    traMADol (ULTRAM) 50 MG tablet  Every 6 hours PRN     10/12/17 1822          This chart was dictated using voice recognition software/Dragon. Despite best efforts to proofread, errors can occur which can change the meaning. Any change was purely unintentional.    Laban Emperor, PA-C 10/12/17 1834    Earleen Newport, MD 10/12/17 2120

## 2017-10-12 NOTE — ED Notes (Signed)
X-ray at bedside

## 2018-04-16 ENCOUNTER — Other Ambulatory Visit: Payer: Self-pay | Admitting: Family Medicine

## 2018-04-16 DIAGNOSIS — Z1231 Encounter for screening mammogram for malignant neoplasm of breast: Secondary | ICD-10-CM

## 2018-05-08 ENCOUNTER — Ambulatory Visit
Admission: RE | Admit: 2018-05-08 | Discharge: 2018-05-08 | Disposition: A | Discharge status: Home / Self Care | Payer: Medicare Other | Source: Ambulatory Visit | Attending: Family Medicine | Admitting: Family Medicine

## 2018-05-08 DIAGNOSIS — Z1231 Encounter for screening mammogram for malignant neoplasm of breast: Secondary | ICD-10-CM | POA: Diagnosis not present

## 2018-06-20 ENCOUNTER — Other Ambulatory Visit (INDEPENDENT_AMBULATORY_CARE_PROVIDER_SITE_OTHER): Payer: Self-pay | Admitting: Nurse Practitioner

## 2018-06-20 ENCOUNTER — Encounter (INDEPENDENT_AMBULATORY_CARE_PROVIDER_SITE_OTHER): Payer: Self-pay | Admitting: Nurse Practitioner

## 2018-06-20 ENCOUNTER — Encounter

## 2018-06-20 ENCOUNTER — Ambulatory Visit (INDEPENDENT_AMBULATORY_CARE_PROVIDER_SITE_OTHER): Payer: Medicare Other

## 2018-06-20 ENCOUNTER — Ambulatory Visit (INDEPENDENT_AMBULATORY_CARE_PROVIDER_SITE_OTHER): Payer: Medicare Other | Admitting: Nurse Practitioner

## 2018-06-20 ENCOUNTER — Encounter (INDEPENDENT_AMBULATORY_CARE_PROVIDER_SITE_OTHER): Payer: Self-pay

## 2018-06-20 VITALS — BP 167/92 | HR 74 | Resp 16 | Ht 62.0 in | Wt 158.0 lb

## 2018-06-20 DIAGNOSIS — N186 End stage renal disease: Secondary | ICD-10-CM | POA: Diagnosis not present

## 2018-06-20 DIAGNOSIS — E785 Hyperlipidemia, unspecified: Secondary | ICD-10-CM | POA: Insufficient documentation

## 2018-06-20 DIAGNOSIS — E1122 Type 2 diabetes mellitus with diabetic chronic kidney disease: Secondary | ICD-10-CM

## 2018-06-20 DIAGNOSIS — R809 Proteinuria, unspecified: Secondary | ICD-10-CM | POA: Insufficient documentation

## 2018-06-20 DIAGNOSIS — N185 Chronic kidney disease, stage 5: Secondary | ICD-10-CM

## 2018-06-20 DIAGNOSIS — I10 Essential (primary) hypertension: Secondary | ICD-10-CM

## 2018-06-20 DIAGNOSIS — E119 Type 2 diabetes mellitus without complications: Secondary | ICD-10-CM | POA: Insufficient documentation

## 2018-06-20 DIAGNOSIS — N183 Chronic kidney disease, stage 3 unspecified: Secondary | ICD-10-CM | POA: Insufficient documentation

## 2018-06-20 DIAGNOSIS — R319 Hematuria, unspecified: Secondary | ICD-10-CM | POA: Insufficient documentation

## 2018-06-20 DIAGNOSIS — N200 Calculus of kidney: Secondary | ICD-10-CM | POA: Insufficient documentation

## 2018-06-21 ENCOUNTER — Encounter (INDEPENDENT_AMBULATORY_CARE_PROVIDER_SITE_OTHER): Payer: Self-pay

## 2018-06-21 ENCOUNTER — Encounter (INDEPENDENT_AMBULATORY_CARE_PROVIDER_SITE_OTHER): Payer: Self-pay | Admitting: Nurse Practitioner

## 2018-06-21 DIAGNOSIS — N185 Chronic kidney disease, stage 5: Secondary | ICD-10-CM | POA: Insufficient documentation

## 2018-06-21 NOTE — Progress Notes (Signed)
Subjective:    Patient ID: Krista Gordon, female    DOB: 04-05-1941, 77 y.o.   MRN: [id number] Chief Complaint  Patient presents with  . New Patient (Initial Visit)    ref [facility] for vein mapping    HPI  Krista Gordon is a 77 y.o. female who presents as a consult from [facility].  Per her notes, Krista Gordon has a GFR of 13.  She presents today for bilateral upper extremity vein mapping.  She currently has not yet begun dialysis.  She denies any chest pain or shortness of breath.  She denies any itching or hiccuping.  She denies any fever, nausea, vomiting, chills, diarrhea.  She denies any amaurosis fugax or TIA-like symptoms.  The patient underwent upper extremity vein mapping today, which revealed the native veins on the left upper extremity are not adequate for fistula creation.  The veins on the right arm were mostly too small for native AV fistula creation.  Upper extremity arterial duplex revealed triphasic flows throughout the brachial radial and ulnar arteries bilaterally.   Constitutional: [] Weight loss  [] Fever  [] Chills Cardiac: [] Chest pain   [] Chest pressure   [] Palpitations   [] Shortness of breath when laying flat   [] Shortness of breath with exertion. Vascular:  [] Pain in legs with walking   [] Pain in legs with standing  [] History of DVT   [] Phlebitis   [] Swelling in legs   [] Varicose veins   [] Non-healing ulcers Pulmonary:   [] Uses home oxygen   [] Productive cough   [] Hemoptysis   [] Wheeze  [] COPD   [] Asthma Neurologic:  [] Dizziness   [] Seizures   [] History of stroke   [] History of TIA  [] Aphasia   [] Vissual changes   [] Weakness or numbness in arm   [] Weakness or numbness in leg Musculoskeletal:   [] Joint swelling   [] Joint pain   [] Low back pain Hematologic:  [] Easy bruising  [] Easy bleeding   [] Hypercoagulable state   [] Anemic Gastrointestinal:  [] Diarrhea   [] Vomiting  [] Gastroesophageal reflux/heartburn   [] Difficulty swallowing. Genitourinary:  [] Chronic kidney  disease   [] Difficult urination  [] Frequent urination   [] Blood in urine Skin:  [] Rashes   [] Ulcers  Psychological:  [] History of anxiety   []  History of major depression.     Objective:   Physical Exam  BP (!) 167/92 (BP Location: Right Arm)   Pulse 74   Resp 16   Ht 5\' 2"  (1.575 m)   Wt 158 lb (71.7 kg)   BMI 28.90 kg/m   Past Medical History:  Diagnosis Date  . Diabetes mellitus without complication ([place])   . Gall stone   . Gout   . Hyperlipidemia   . Hypertension      Gen: WD/WN, NAD, appears sad and anxious at Head: [place]/AT, No temporalis wasting.  Ear/Nose/Throat: Hearing grossly intact, nares w/o erythema or drainage Eyes: PER, EOMI, sclera nonicteric.  Neck: Supple, no masses.  No JVD.  Pulmonary:  Good air movement, no use of accessory muscles.  Cardiac: RRR Vascular:  Vessel Right Left  Radial  2+  2 plus  Gastrointestinal: soft, non-distended. No guarding/no peritoneal signs.  Musculoskeletal: M/S 5/5 throughout.  No deformity or atrophy.  Neurologic: Pain and light touch intact in extremities.  Symmetrical.  Speech is fluent. Motor exam as listed above. Psychiatric: Judgment intact, Mood & affect appropriate for pt's clinical situation. Dermatologic: No Venous rashes. No Ulcers Noted.  No changes consistent with cellulitis. Lymph : No Cervical lymphadenopathy, no lichenification or skin  changes of chronic lymphedema.   Social History   Socioeconomic History  . Marital status: Married    Spouse name: Not on file  . Number of children: Not on file  . Years of education: Not on file  . Highest education level: Not on file  Occupational History  . Not on file  Social Needs  . Financial resource strain: Not on file  . Food insecurity:    Worry: Not on file    Inability: Not on file  . Transportation needs:    Medical: Not on file    Non-medical: Not on file  Tobacco Use  . Smoking status: Former Research scientist (life sciences)  . Smokeless tobacco: Never Used  Substance  and Sexual Activity  . Alcohol use: No  . Drug use: No  . Sexual activity: Not on file  Lifestyle  . Physical activity:    Days per week: Not on file    Minutes per session: Not on file  . Stress: Not on file  Relationships  . Social connections:    Talks on phone: Not on file    Gets together: Not on file    Attends religious service: Not on file    Active member of club or organization: Not on file    Attends meetings of clubs or organizations: Not on file    Relationship status: Not on file  . Intimate partner violence:    Fear of current or ex partner: Not on file    Emotionally abused: Not on file    Physically abused: Not on file    Forced sexual activity: Not on file  Other Topics Concern  . Not on file  Social History Narrative  . Not on file    Past Surgical History:  Procedure Laterality Date  . COLONOSCOPY WITH PROPOFOL N/A 01/27/2016   Procedure: COLONOSCOPY WITH PROPOFOL;  Surgeon: Hulen Luster, MD;  [facility] ENDOSCOPY;  Service: Gastroenterology;  Laterality: N/A;  . TUBAL LIGATION      Family History  Problem Relation Age of Onset  . Breast cancer Sister 20    No Known Allergies     Assessment & Plan:   1. CKD (chronic kidney disease), stage V ([place]) Recommend:  At this time the patient does not have appropriate extremity access for dialysis  Patient should have a left upper extremity brachial axillary graft created.  The risks, benefits and alternative therapies were reviewed in detail with the patient.  All questions were answered.  The patient agrees to proceed with surgery.    2. Essential hypertension Continue antihypertensive medications as already ordered, these medications have been reviewed and there are no changes at this time.   3. Type 2 diabetes mellitus with chronic kidney disease, without long-term current use of insulin, unspecified CKD stage ([place]) Continue hypoglycemic medications as already ordered, these medications have  been reviewed and there are no changes at this time.  Hgb A1C to be monitored as already arranged by primary service   4. Hyperlipidemia, unspecified hyperlipidemia type Continue statin as ordered and reviewed, no changes at this time   Current Outpatient Medications on File Prior to Visit  Medication Sig Dispense Refill  . alendronate (FOSAMAX) 70 MG tablet Take 70 mg by mouth once a week. Take with a full glass of water on an empty stomach.    Marland Kitchen amLODipine (NORVASC) 5 MG tablet Take 5 mg by mouth daily.    Marland Kitchen aspirin (ASPIRIN EC) 81 MG EC tablet Take 81  mg by mouth daily. Swallow whole.    Marland Kitchen atorvastatin (LIPITOR) 40 MG tablet Take 40 mg by mouth daily.    . cetirizine (ZYRTEC) 10 MG tablet Take 10 mg by mouth daily.    . colchicine 0.6 MG tablet Take 0.6 mg by mouth daily.    Marland Kitchen linagliptin (TRADJENTA) 5 MG TABS tablet Take 5 mg by mouth daily.    . quinapril (ACCUPRIL) 40 MG tablet Take 40 mg by mouth at bedtime.    . traMADol (ULTRAM) 50 MG tablet Take 0.5 tablets (25 mg total) by mouth every 6 (six) hours as needed. 8 tablet 0  . insulin glargine (LANTUS) 100 UNIT/ML injection Inject into the skin at bedtime.     No current facility-administered medications on file prior to visit.     There are no Patient Instructions on file for this visit. No follow-ups on file.   Kris Hartmann, NP

## 2018-06-26 ENCOUNTER — Telehealth (INDEPENDENT_AMBULATORY_CARE_PROVIDER_SITE_OTHER): Payer: Self-pay

## 2018-06-26 NOTE — Telephone Encounter (Signed)
Patient called stating that per her Nephrologist her surgery is to be canceled because she now has more kidney function.

## 2018-07-02 ENCOUNTER — Inpatient Hospital Stay: Admission: RE | Admit: 2018-07-02 | Payer: Medicare Other | Source: Ambulatory Visit

## 2018-07-10 ENCOUNTER — Ambulatory Visit: Admit: 2018-07-10 | Payer: Medicare Other | Admitting: Vascular Surgery

## 2018-07-10 SURGERY — INSERTION OF ARTERIOVENOUS (AV) GORE-TEX GRAFT ARM
Anesthesia: General | Laterality: Left

## 2018-07-24 ENCOUNTER — Encounter (INDEPENDENT_AMBULATORY_CARE_PROVIDER_SITE_OTHER): Payer: Self-pay

## 2018-07-24 ENCOUNTER — Encounter (INDEPENDENT_AMBULATORY_CARE_PROVIDER_SITE_OTHER): Payer: Self-pay | Admitting: Vascular Surgery

## 2018-07-24 ENCOUNTER — Encounter

## 2019-01-06 ENCOUNTER — Encounter: Payer: Self-pay | Admitting: Emergency Medicine

## 2019-01-06 ENCOUNTER — Other Ambulatory Visit: Payer: Self-pay

## 2019-01-06 ENCOUNTER — Emergency Department: Payer: Medicare Other

## 2019-01-06 ENCOUNTER — Emergency Department
Admission: EM | Admit: 2019-01-06 | Discharge: 2019-01-06 | Disposition: A | Discharge status: Home / Self Care | Payer: Medicare Other | Attending: Emergency Medicine | Admitting: Emergency Medicine

## 2019-01-06 DIAGNOSIS — I120 Hypertensive chronic kidney disease with stage 5 chronic kidney disease or end stage renal disease: Secondary | ICD-10-CM | POA: Insufficient documentation

## 2019-01-06 DIAGNOSIS — Z79899 Other long term (current) drug therapy: Secondary | ICD-10-CM | POA: Insufficient documentation

## 2019-01-06 DIAGNOSIS — Z7982 Long term (current) use of aspirin: Secondary | ICD-10-CM | POA: Diagnosis not present

## 2019-01-06 DIAGNOSIS — N185 Chronic kidney disease, stage 5: Secondary | ICD-10-CM | POA: Insufficient documentation

## 2019-01-06 DIAGNOSIS — E119 Type 2 diabetes mellitus without complications: Secondary | ICD-10-CM | POA: Diagnosis not present

## 2019-01-06 DIAGNOSIS — Z87891 Personal history of nicotine dependence: Secondary | ICD-10-CM | POA: Diagnosis not present

## 2019-01-06 DIAGNOSIS — R109 Unspecified abdominal pain: Secondary | ICD-10-CM

## 2019-01-06 DIAGNOSIS — N390 Urinary tract infection, site not specified: Secondary | ICD-10-CM | POA: Insufficient documentation

## 2019-01-06 HISTORY — DX: Disorder of kidney and ureter, unspecified: N28.9

## 2019-01-06 LAB — URINALYSIS, COMPLETE (UACMP) WITH MICROSCOPIC
Bilirubin Urine: NEGATIVE
Glucose, UA: NEGATIVE mg/dL
KETONES UR: NEGATIVE mg/dL
Nitrite: NEGATIVE
Protein, ur: 100 mg/dL — AB
Specific Gravity, Urine: 1.011 (ref 1.005–1.030)
pH: 5 (ref 5.0–8.0)

## 2019-01-06 LAB — CBC
HEMATOCRIT: 32.8 % — AB (ref 36.0–46.0)
HEMOGLOBIN: 9.6 g/dL — AB (ref 12.0–15.0)
MCH: 28.5 pg (ref 26.0–34.0)
MCHC: 29.3 g/dL — AB (ref 30.0–36.0)
MCV: 97.3 fL (ref 80.0–100.0)
Platelets: 221 10*3/uL (ref 150–400)
RBC: 3.37 MIL/uL — ABNORMAL LOW (ref 3.87–5.11)
RDW: 13.8 % (ref 11.5–15.5)
WBC: 10.8 10*3/uL — ABNORMAL HIGH (ref 4.0–10.5)
nRBC: 0 % (ref 0.0–0.2)

## 2019-01-06 LAB — COMPREHENSIVE METABOLIC PANEL
ALK PHOS: 98 U/L (ref 38–126)
ALT: 18 U/L (ref 0–44)
AST: 35 U/L (ref 15–41)
Albumin: 3.5 g/dL (ref 3.5–5.0)
Anion gap: 8 (ref 5–15)
BUN: 58 mg/dL — AB (ref 8–23)
CALCIUM: 8.1 mg/dL — AB (ref 8.9–10.3)
CHLORIDE: 112 mmol/L — AB (ref 98–111)
CO2: 20 mmol/L — AB (ref 22–32)
CREATININE: 4.61 mg/dL — AB (ref 0.44–1.00)
GFR calc non Af Amer: 9 mL/min — ABNORMAL LOW (ref >60)
GFR, EST AFRICAN AMERICAN: 10 mL/min — AB (ref >60)
Glucose, Bld: 100 mg/dL — ABNORMAL HIGH (ref 70–99)
Potassium: 4.8 mmol/L (ref 3.5–5.1)
SODIUM: 140 mmol/L (ref 135–145)
Total Bilirubin: 0.4 mg/dL (ref 0.3–1.2)
Total Protein: 7.6 g/dL (ref 6.5–8.1)

## 2019-01-06 LAB — LIPASE, BLOOD: LIPASE: 83 U/L — AB (ref 11–51)

## 2019-01-06 MED ORDER — CEPHALEXIN 500 MG PO CAPS: 500.0000 mg | ORAL_CAPSULE | Freq: Three times a day (TID) | ORAL | 0 refills | Status: DC

## 2019-01-06 MED ORDER — SODIUM CHLORIDE 0.9 % IV BOLUS
500.0000 mL | Freq: Once | INTRAVENOUS | Status: AC
  Administered 2019-01-06: 500 mL via INTRAVENOUS

## 2019-01-06 MED ORDER — SODIUM CHLORIDE 0.9 % IV SOLN
1.0000 g | Freq: Once | INTRAVENOUS | Status: AC
  Administered 2019-01-06: 1 g via INTRAVENOUS
  Filled 2019-01-06: qty 10

## 2019-01-06 MED ORDER — CEPHALEXIN 500 MG PO CAPS: 500.0000 mg | ORAL_CAPSULE | Freq: Two times a day (BID) | ORAL | 0 refills | Status: AC

## 2019-01-06 NOTE — ED Triage Notes (Signed)
Patient presents to the ED with left lower abdominal pain that began yesterday.  Patient states, "it feels like a knot has come up, like it's going to bust or something, but it feels pretty good right now."  Patient denies nausea, vomiting and diarrhea.  Patient reports some sinus congestion but denies cough, shortness of breath and fever.

## 2019-01-06 NOTE — ED Notes (Signed)

## 2019-01-06 NOTE — Discharge Instructions (Signed)
If you have increased pain, fever, vomiting, pain on your side, or you feel worse in any way return to the emergency department.  Otherwise, follow close with primary care doctor.

## 2019-01-06 NOTE — ED Provider Notes (Signed)
[facility] Emergency Department Provider Note  ____________________________________________   I have reviewed the triage vital signs and the nursing notes. Where available I have reviewed prior notes and, if possible and indicated, outside hospital notes.    HISTORY  Chief Complaint Abdominal Pain    HPI Krista Gordon is a 78 y.o. female  Who presents today complaining of some pain on the left side.  No vomiting no fever.  No pain at this time.  No dysuria no urinary frequency.  Patient has a history of significant renal impairment with a atrophic right-sided kidney and baseline kidney function now between 4.2 and 4.8 according to prior nephrology notes.  She denies any other symptoms.     Past Medical History:  Diagnosis Date  . Diabetes mellitus without complication ([place])   . Gall stone   . Gout   . Hyperlipidemia   . Hypertension   . Renal disorder    kidney disease    Patient Active Problem List   Diagnosis Date Noted  . CKD (chronic kidney disease), stage V ([place]) 06/21/2018  . Nephrolithiasis 06/20/2018  . Chronic kidney disease, stage III (moderate) ([place]) 06/20/2018  . Diabetes ([place]) 06/20/2018  . Essential hypertension 06/20/2018  . Hematuria 06/20/2018  . HLD (hyperlipidemia) 06/20/2018  . Proteinuria 06/20/2018    Past Surgical History:  Procedure Laterality Date  . COLONOSCOPY WITH PROPOFOL N/A 01/27/2016   Procedure: COLONOSCOPY WITH PROPOFOL;  Surgeon: Hulen Luster, MD;  Location: [facility] ENDOSCOPY;  Service: Gastroenterology;  Laterality: N/A;  . TUBAL LIGATION      Prior to Admission medications   Medication Sig Start Date End Date Taking? Authorizing Provider  alendronate (FOSAMAX) 70 MG tablet Take 70 mg by mouth every Monday. Take with a full glass of water on an empty stomach.     [provider]  amLODipine (NORVASC) 5 MG tablet Take 5 mg by mouth at bedtime.     [provider]  aspirin (ASPIRIN EC) 81  MG EC tablet Take 81 mg by mouth at bedtime. Swallow whole.     [provider]  atorvastatin (LIPITOR) 40 MG tablet Take 40 mg by mouth at bedtime.     [provider]  calcitonin, salmon, (MIACALCIN/FORTICAL) 200 UNIT/ACT nasal spray Place 1 spray into alternate nostrils at bedtime.    [provider]  Cholecalciferol (VITAMIN D) 2000 units tablet Take 2,000 Units by mouth daily.    [provider]  colchicine 0.6 MG tablet Take 0.6 mg by mouth at bedtime.     [provider]  glipiZIDE (GLUCOTROL XL) 5 MG 24 hr tablet Take 5 mg by mouth at bedtime.    [provider]  linagliptin (TRADJENTA) 5 MG TABS tablet Take 5 mg by mouth at bedtime.     [provider]  Multiple Vitamin (MULTIVITAMIN WITH MINERALS) TABS tablet Take 1 tablet by mouth at bedtime.    [provider]  PROAIR HFA 108 [phone] Base) MCG/ACT inhaler Inhale 2 puffs into the lungs every 6 (six) hours as needed for shortness of breath. 06/15/18   [provider]  quinapril (ACCUPRIL) 40 MG tablet Take 40 mg by mouth at bedtime.    [provider]  sodium bicarbonate 650 MG tablet Take 650 mg by mouth daily.  04/01/18   [provider]  torsemide (DEMADEX) 10 MG tablet Take 10 mg by mouth daily.    [provider]    Allergies Patient has  no known allergies.  Family History  Problem Relation Age of Onset  . Breast cancer Sister 71    Social History Social History   Tobacco Use  . Smoking status: Former Research scientist (life sciences)  . Smokeless tobacco: Never Used  Substance Use Topics  . Alcohol use: No  . Drug use: No    Review of Systems Constitutional: No fever/chills Eyes: No visual changes. ENT: No sore throat. No stiff neck no neck pain Cardiovascular: Denies chest pain. Respiratory: Denies shortness of breath. Gastrointestinal:   no vomiting.  No diarrhea.  No constipation. Genitourinary: Negative for dysuria. Musculoskeletal:  Negative lower extremity swelling Skin: Negative for rash. Neurological: Negative for severe headaches, focal weakness or numbness.   ____________________________________________   PHYSICAL EXAM:  VITAL SIGNS: ED Triage Vitals  Enc Vitals Group     BP 01/06/19 1715 138/72     Pulse Rate 01/06/19 1715 90     Resp 01/06/19 1715 18     Temp 01/06/19 1715 98.6 F (37 C)     Temp Source 01/06/19 1715 Oral     SpO2 01/06/19 1715 100 %     Weight 01/06/19 1716 157 lb (71.2 kg)     Height 01/06/19 1716 5\' 2"  (1.575 m)     Head Circumference --      Peak Flow --      Pain Score 01/06/19 1716 6     Pain Loc --      Pain Edu? --      Excl. in [place]? --     Constitutional: Alert and oriented. Well appearing and in no acute distress. Eyes: Conjunctivae are normal Head: Atraumatic HEENT: No congestion/rhinnorhea. Mucous membranes are moist.  Oropharynx non-erythematous Neck:   Nontender with no meningismus, no masses, no stridor Cardiovascular: Normal rate, regular rhythm. Grossly normal heart sounds.  Good peripheral circulation. Respiratory: Normal respiratory effort.  No retractions. Lungs CTAB. Abdominal: Soft and slight left lower quadrant discomfort. No distention. No guarding no rebound Back:  There is no focal tenderness or step off.  there is no midline tenderness there are no lesions noted. there is no CVA tenderness Musculoskeletal: No lower extremity tenderness, no upper extremity tenderness. No joint effusions, no DVT signs strong distal pulses no edema Neurologic:  Normal speech and language. No gross focal neurologic deficits are appreciated.  Skin:  Skin is warm, dry and intact. No rash noted. Psychiatric: Mood and affect are normal. Speech and behavior are normal.  ____________________________________________   LABS (all labs ordered are listed, but only abnormal results are displayed)  Labs Reviewed  LIPASE, BLOOD - Abnormal; Notable for the following components:       Result Value   Lipase 83 (*)    All other components within normal limits  COMPREHENSIVE METABOLIC PANEL - Abnormal; Notable for the following components:   Chloride 112 (*)    CO2 20 (*)    Glucose, Bld 100 (*)    BUN 58 (*)    Creatinine, Ser 4.61 (*)    Calcium 8.1 (*)    GFR calc non Af Amer 9 (*)    GFR calc Af Amer 10 (*)    All other components within normal limits  CBC - Abnormal; Notable for the following components:   WBC 10.8 (*)    RBC 3.37 (*)    Hemoglobin 9.6 (*)    HCT 32.8 (*)    MCHC 29.3 (*)    All other components within normal limits  URINALYSIS, COMPLETE (  UACMP) WITH MICROSCOPIC - Abnormal; Notable for the following components:   Color, Urine YELLOW (*)    APPearance CLOUDY (*)    Hgb urine dipstick MODERATE (*)    Protein, ur 100 (*)    Leukocytes,Ua LARGE (*)    WBC, UA >50 (*)    Bacteria, UA MANY (*)    All other components within normal limits  URINE CULTURE    Pertinent labs  results that were available during my care of the patient were reviewed by me and considered in my medical decision making (see chart for details). ____________________________________________  EKG  I personally interpreted any EKGs ordered by me or triage  ____________________________________________  RADIOLOGY  Pertinent labs & imaging results that were available during my care of the patient were reviewed by me and considered in my medical decision making (see chart for details). If possible, patient and/or family made aware of any abnormal findings.  Ct Renal Stone Study  Result Date: 01/06/2019 CLINICAL DATA:  Lower abdominal pain EXAM: CT ABDOMEN AND PELVIS WITHOUT CONTRAST TECHNIQUE: Multidetector CT imaging of the abdomen and pelvis was performed following the standard protocol without IV contrast. COMPARISON:  None. FINDINGS: Lower chest: Subpleural reticulation/fibrosis at the lung bases, suggesting mild chronic interstitial lung disease. Hepatobiliary:  Unenhanced liver is unremarkable. Status post cholecystectomy. No intrahepatic or extrahepatic ductal dilatation. Pancreas: Within normal limits. Spleen: Within normal limits. Adrenals/Urinary Tract: Adrenal glands are within normal limits. Severe right renal cortical scarring/atrophy. Lobulated 4.3 cm cystic lesion along the medial right upper kidney (series 2/image 23), with layering calculus (series 2/image 24), benign. Additional parenchymal calcifications (series 2/image 17). Left kidney is within normal limits. No renal calculi or hydronephrosis. Low-lying bladder with a cystocele at rest (sagittal image 90). Stomach/Bowel: Stomach is within normal limits. No evidence of bowel obstruction. Appendix is not discretely visualized. Extensive left colonic diverticulosis, without evidence of diverticulitis. Vascular/Lymphatic: No evidence of abdominal aortic aneurysm. Atherosclerotic calcifications of the abdominal aorta and branch vessels. No suspicious abdominopelvic lymphadenopathy. Reproductive: Uterus and left ovary are within normal limits. No right adnexal mass. Other: No abdominopelvic ascites. Musculoskeletal: Degenerative changes of the visualized thoracolumbar spine. IMPRESSION: No renal, ureteral, or bladder calculi.  No hydronephrosis. Severe right renal cortical scarring/atrophy paranoid lobulated 4.3 cm cystic lesion along the medial right upper kidney with associated layering calcification. Cystocele at rest. No evidence of bowel obstruction. Appendix is not discretely visualized. Extensive colonic diverticulosis, without evidence of diverticulitis. Electronically Signed   By: Julian Hy M.D.   On: 01/06/2019 18:32   ____________________________________________    PROCEDURES  Procedure(s) performed: None  Procedures  Critical Care performed: None  ____________________________________________   INITIAL IMPRESSION / ASSESSMENT AND PLAN / ED COURSE  Pertinent labs & imaging  results that were available during my care of the patient were reviewed by me and considered in my medical decision making (see chart for details).  Patient here with lower abdominal discomfort, abdomen is benign there is minimal tenderness to left lower quadrant.  Given her history I did a CT scan to rule out diverticular or renal stone pathology because, she certainly has exposed given her renal function.  She does have a urinary tract infection there is no evidence on CT scan or clinically pyelonephritis fortunately.  Her baseline creatinine is between 4.2 and 4.6 and she is in no acute distress.  There is no evidence of other acute pathology.  We will give her gentle IV hydration given her baseline kidney function IV antibiotics.  Patient refuses admission, she is adamant that she wants to go home.  I am sending a urine culture, and we will have her follow closely with primary care doctor.  She does seem to be at her baseline other respects and CT scan is quite reassuring.    ____________________________________________   FINAL CLINICAL IMPRESSION(S) / ED DIAGNOSES  Final diagnoses:  None      This chart was dictated using voice recognition software.  Despite best efforts to proofread,  errors can occur which can change meaning.      Schuyler Amor, MD 01/06/19 [phone]

## 2019-01-07 LAB — URINE CULTURE

## 2019-04-10 ENCOUNTER — Other Ambulatory Visit: Payer: Self-pay | Admitting: Family Medicine

## 2019-04-10 DIAGNOSIS — Z1231 Encounter for screening mammogram for malignant neoplasm of breast: Secondary | ICD-10-CM

## 2019-05-27 ENCOUNTER — Ambulatory Visit
Admission: RE | Admit: 2019-05-27 | Discharge: 2019-05-27 | Disposition: A | Discharge status: Home / Self Care | Payer: Medicare Other | Source: Ambulatory Visit | Attending: Family Medicine | Admitting: Family Medicine

## 2019-05-27 DIAGNOSIS — Z1231 Encounter for screening mammogram for malignant neoplasm of breast: Secondary | ICD-10-CM | POA: Diagnosis present

## 2019-12-25 ENCOUNTER — Other Ambulatory Visit (INDEPENDENT_AMBULATORY_CARE_PROVIDER_SITE_OTHER): Payer: Self-pay | Admitting: Vascular Surgery

## 2019-12-25 DIAGNOSIS — N186 End stage renal disease: Secondary | ICD-10-CM

## 2019-12-26 ENCOUNTER — Ambulatory Visit (INDEPENDENT_AMBULATORY_CARE_PROVIDER_SITE_OTHER): Payer: Medicare Other

## 2019-12-26 ENCOUNTER — Encounter (INDEPENDENT_AMBULATORY_CARE_PROVIDER_SITE_OTHER): Payer: Self-pay | Admitting: Vascular Surgery

## 2019-12-26 ENCOUNTER — Ambulatory Visit (INDEPENDENT_AMBULATORY_CARE_PROVIDER_SITE_OTHER): Payer: Medicare Other | Admitting: Vascular Surgery

## 2019-12-26 ENCOUNTER — Other Ambulatory Visit: Payer: Self-pay

## 2019-12-26 VITALS — BP 151/74 | HR 80 | Resp 18 | Ht 62.0 in | Wt 148.0 lb

## 2019-12-26 DIAGNOSIS — E785 Hyperlipidemia, unspecified: Secondary | ICD-10-CM | POA: Diagnosis not present

## 2019-12-26 DIAGNOSIS — I10 Essential (primary) hypertension: Secondary | ICD-10-CM | POA: Diagnosis not present

## 2019-12-26 DIAGNOSIS — E1122 Type 2 diabetes mellitus with diabetic chronic kidney disease: Secondary | ICD-10-CM | POA: Diagnosis not present

## 2019-12-26 DIAGNOSIS — N186 End stage renal disease: Secondary | ICD-10-CM

## 2019-12-26 DIAGNOSIS — N185 Chronic kidney disease, stage 5: Secondary | ICD-10-CM | POA: Diagnosis not present

## 2019-12-26 NOTE — Assessment & Plan Note (Signed)
lipid control important in reducing the progression of atherosclerotic disease. Continue statin therapy  

## 2019-12-26 NOTE — Patient Instructions (Signed)
Vascular Access for Hemodialysis        A vascular access is a connection to the blood inside your blood vessels that allows blood to be easily removed from your body and returned to your body during kidney dialysis (hemodialysis). Hemodialysis is a procedure in which a machine outside of the body filters the blood of a person whose kidneys are no longer working properly. There are three types of vascular accesses:  Arteriovenous fistula (AVF). This is a connection between an artery and a vein (usually in the arm) that is made by sewing them together. Blood in the artery flows directly into the vein, causing it to get larger over time. This makes it easier for the vein to be used for hemodialysis. An arteriovenous fistula takes 1-6 months to develop after surgery.  Arteriovenous graft (AVG). This is a connection between an artery and a vein in the arm that is made with a tube. An arteriovenous graft can be used within 2-3 weeks of surgery.  Venous catheter. This is a thin, flexible tube that is placed in a large vein (usually in the neck, chest, or groin). A venous catheter for hemodialysis contains two tubes that come out of the skin. A venous catheter can be used right away. It is usually used as a temporary access if you need hemodialysis before a fistula or graft has developed, or if kidney failure is sudden (acute) and likely to improve without the need for long-term dialysis. It may also be used as a permanent access if a fistula or graft cannot be created. Which type of access is best for me? The type of access that is best for you depends on the size and strength of your veins, your age, and any other health problems that you have, such as diabetes. An ultrasound test may be used to look at your veins to help make this decision. A fistula is usually the preferred type of access. It can last several years and is less likely than the other types of accesses to become infected or to cause a blood  clot within a blood vessel (thrombosis). However, a fistula is not an option for everyone. If your veins are not the right size or if the fistula does not develop properly, a graft may be used instead. Grafts require you to have strong veins. If your veins are not strong enough for a graft, a catheter may be used. Catheters are more likely than fistulas and grafts to become infected or to have a thrombosis. Sometimes, only one type of access is an option. Your health care provider will help you determine which type of access is best for you. How is a vascular access used? The way that the access is used depends on the type of access:  If the access is a fistula or graft, two needles are inserted through the skin into the access before each hemodialysis session. Blood leaves the body through one of the needles and travels through a tube to the hemodialysis machine (dialyzer). Then it flows through another tube and returns to the body through the second needle.  If the access is a catheter, one tube is connected directly to the tube that leads to the dialyzer, and the other tube is connected to a tube that leads away from the dialyzer. Blood leaves the body through one tube and returns to the body through the other. What problems can occur with vascular access?  A blood clot within a blood vessel (thrombosis).  Thrombosis can lead to a narrowing of a blood vessel (stenosis). If thrombosis occurs frequently, another access site may be created as a backup.  Infection.  Heart enlargement (cardiomegaly) and heart failure. Changes in blood flow may cause an increase in blood pressure or heart rate, making your heart work harder to pump blood. These problems are most likely to occur with a venous catheter and least likely to occur with an arteriovenous fistula. How do I care for my vascular access?  Wear a medical alert bracelet. In case of an emergency, this bracelet tells health care providers that you  are a dialysis patient and allows them to care for your veins appropriately. If you have a graft or fistula:  A "bruit" is a noise that is heard with a stethoscope, and a "thrill" is a vibration that is felt over the graft or fistula. The presence of the bruit and thrill indicates that the access is working. You will be taught to feel for the thrill each day. If this is not felt, the access may be clotted. Call your health care provider.  Keep your arm straight and raised (elevated) above your heart while the access site is healing.  You may freely use the arm where your vascular access is located after the site heals. Keep the following in mind: ? Avoid pressure on the arm. ? Avoid lifting heavy objects with the arm. ? Avoid sleeping on the arm. ? Avoid wearing tight-sleeved shirts or jewelry around the graft or fistula.  Do not allow blood pressure monitoring or needle punctures on the side where the graft or fistula is located.  With permission from your health care provider, you may do exercises to help with blood flow through a fistula. These exercises involve squeezing a rubber ball or other soft objects as instructed.  Wash your access site according to directions from your health care provider. If you have a venous catheter:  Keep the insertion site clean and dry at all times.  If you are told you can shower after the site heals, use a protective covering over the catheter to keep it dry.  Follow directions from your health care provider for bandage (dressing) changes.  Ask your health care provider what activities are safe for you. You may be restricted from lifting or making repetitive arm movements on the side with the catheter. Contact a health care provider if:  Swelling around the graft or fistula gets worse.  You develop new pain.  Your catheter gets damaged. Get help right away if:  You have pain, numbness, an unusual pale skin color, or blue fingers or sores at  the tips of your fingers in the hand on the side of your fistula.  You have chills.  You have a fever.  You have pus or other fluid (drainage) at the vascular access site.  You develop skin redness or red streaking on the skin around, above, or below the vascular access.  You have bleeding at the vascular access that cannot be easily controlled.  Your catheter gets pulled out of place.  You feel your heart racing or skipping beats.  You have chest pain. Summary  A vascular access is a connection to the blood inside your blood vessels that allows blood to be easily removed from your body and returned to your body during kidney dialysis (hemodialysis).  There are three types of vascular accesses.The type of access that is best for you depends on the size and strength of your  veins, your age, and any other health problems that you have, such as diabetes.  A fistula is usually the preferred type of access, although it is not an option for everyone. It can last several years and is less likely than the other types of accesses to become infected or to cause a blood clot within a blood vessel (thrombosis).  Wear a medical alert bracelet. In case of an emergency, this tells health care providers that you are a dialysis patient. This information is not intended to replace advice given to you by your health care provider. Make sure you discuss any questions you have with your health care provider. Document Revised: 01/21/2019 Document Reviewed: 10/27/2016 Elsevier Patient Education  [place].

## 2019-12-26 NOTE — Assessment & Plan Note (Signed)
Likely an underlying cause of her renal failure and blood pressure control important in reducing the progression of atherosclerotic disease. On appropriate oral medications.

## 2019-12-26 NOTE — Progress Notes (Signed)
Patient ID: Krista Gordon, female   DOB: 06-29-1941, 79 y.o.   MRN: [id number]  Chief Complaint  Patient presents with  . New Patient (Initial Visit)    Vein mapping    HPI Krista Gordon is a 79 y.o. female.  I am asked to see the patient by Dr. Smith Mince for evaluation of dialysis access.  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  Her veins did not appear adequate for fistula creation at that time so we put placement on hold until she was closer to dialysis.  Her nephrologist is fairly comfortable with the patient has imminent need for dialysis access and will need to start treatments in the upcoming weeks.  As such, she is referred back for further evaluation.  She remains adamant that only left arm access to be placed.  Some fluid retention and lethargy but overall feeling reasonably well.  No fevers or chills.  She is right-hand dominant.  Arterial studies today showed normal triphasic flow without obvious stenosis but her vein mapping again shows what appeared to be inadequate cephalic and basilic veins for creation of AV fistula in the left arm.     Past Medical History:  Diagnosis Date  . Diabetes mellitus without complication ([place])   . Gall stone   . Gout   . Hyperlipidemia   . Hypertension   . Renal disorder    kidney disease    Past Surgical History:  Procedure Laterality Date  . COLONOSCOPY WITH PROPOFOL N/A 01/27/2016   Procedure: COLONOSCOPY WITH PROPOFOL;  Surgeon: Hulen Luster, MD;  Location: [facility];  Service: Gastroenterology;  Laterality: N/A;  . TUBAL LIGATION       Family History  Problem Relation Age of Onset  . Breast cancer Sister 85  No bleeding disorders or clotting disorders No aneurysms   Social History   Tobacco Use  . Smoking status: Former Research scientist (life sciences)  . Smokeless tobacco: Never Used  Substance Use Topics    . Alcohol use: No  . Drug use: No     No Known Allergies  Current Outpatient Medications  Medication Sig Dispense Refill  . alendronate (FOSAMAX) 70 MG tablet Take 70 mg by mouth every Monday. Take with a full glass of water on an empty stomach.     Marland Kitchen amLODipine (NORVASC) 5 MG tablet Take 5 mg by mouth at bedtime.     Marland Kitchen aspirin (ASPIRIN EC) 81 MG EC tablet Take 81 mg by mouth at bedtime. Swallow whole.     Marland Kitchen atorvastatin (LIPITOR) 40 MG tablet Take 40 mg by mouth at bedtime.     . calcitonin, salmon, (MIACALCIN/FORTICAL) 200 UNIT/ACT nasal spray Place 1 spray into alternate nostrils at bedtime.    . Cholecalciferol (VITAMIN D) 2000 units tablet Take 2,000 Units by mouth daily.    . colchicine 0.6 MG tablet Take 0.6 mg by mouth at bedtime.     Marland Kitchen glipiZIDE (GLUCOTROL XL) 5 MG 24 hr tablet Take 5 mg by mouth at bedtime.    Marland Kitchen linagliptin (TRADJENTA) 5 MG TABS tablet Take 5 mg by mouth at bedtime.     . Multiple Vitamin (MULTIVITAMIN WITH MINERALS) TABS tablet Take 1 tablet by mouth at bedtime.    Marland Kitchen PROAIR HFA 108 (90 Base) MCG/ACT inhaler Inhale 2 puffs into the lungs  every 6 (six) hours as needed for shortness of breath.  2  . quinapril (ACCUPRIL) 40 MG tablet Take 40 mg by mouth at bedtime.    . sodium bicarbonate 650 MG tablet Take 650 mg by mouth daily.   3  . torsemide (DEMADEX) 10 MG tablet Take 10 mg by mouth daily.    . ferrous sulfate 325 (65 FE) MG tablet Take by mouth.     No current facility-administered medications for this visit.      REVIEW OF SYSTEMS (Negative unless checked)  Constitutional: [] Weight loss  [] Fever  [] Chills Cardiac: [] Chest pain   [] Chest pressure   [] Palpitations   [] Shortness of breath when laying flat   [] Shortness of breath at rest   [x] Shortness of breath with exertion. Vascular:  [] Pain in legs with walking   [] Pain in legs at rest   [] Pain in legs when laying flat   [] Claudication   [] Pain in feet when walking  [] Pain in feet at rest  [] Pain in  feet when laying flat   [] History of DVT   [] Phlebitis   [x] Swelling in legs   [] Varicose veins   [] Non-healing ulcers Pulmonary:   [] Uses home oxygen   [] Productive cough   [] Hemoptysis   [] Wheeze  [] COPD   [] Asthma Neurologic:  [] Dizziness  [] Blackouts   [] Seizures   [] History of stroke   [] History of TIA  [] Aphasia   [] Temporary blindness   [] Dysphagia   [] Weakness or numbness in arms   [] Weakness or numbness in legs Musculoskeletal:  [] Arthritis   [] Joint swelling   [] Joint pain   [] Low back pain Hematologic:  [] Easy bruising  [] Easy bleeding   [] Hypercoagulable state   [x] Anemic  [] Hepatitis Gastrointestinal:  [] Blood in stool   [] Vomiting blood  [] Gastroesophageal reflux/heartburn   [] Abdominal pain Genitourinary:  [x] Chronic kidney disease   [] Difficult urination  [] Frequent urination  [] Burning with urination   [] Hematuria Skin:  [] Rashes   [] Ulcers   [] Wounds Psychological:  [] History of anxiety   []  History of major depression.    Physical Exam BP (!) 151/74   Pulse 80   Resp 18   Ht 5\' 2"  (1.575 m)   Wt 148 lb (67.1 kg)   BMI 27.07 kg/m  Gen:  WD/WN, NAD Head: [place]/AT, No temporalis wasting. Ear/Nose/Throat: Hearing grossly intact, nares w/o erythema or drainage, oropharynx w/o Erythema/Exudate Eyes: Conjunctiva clear, sclera non-icteric  Neck: trachea midline.  No JVD.  Pulmonary:  Good air movement, respirations not labored, no use of accessory muscles  Cardiac: RRR, no JVD Vascular: Allen's test normal in the left arm Vessel Right Left  Radial Palpable Palpable                                   Gastrointestinal:. No masses, surgical incisions, or scars. Musculoskeletal: M/S 5/5 throughout.  Extremities without ischemic changes.  No deformity or atrophy.  Mild lower extremity edema. Neurologic: Sensation grossly intact in extremities.  Symmetrical.  Speech is fluent. Motor exam as listed above. Psychiatric: Judgment intact, Mood & affect appropriate for pt's  clinical situation. Dermatologic: No rashes or ulcers noted.  No cellulitis or open wounds.    Radiology No results found.  Labs No results found for this or any previous visit (from the past 2160 hour(s)).  Assessment/Plan:  HLD (hyperlipidemia) lipid control important in reducing the progression of atherosclerotic disease. Continue statin therapy   Diabetes ([place]) Likely  an underlying cause of her renal failure and blood glucose control important in reducing the progression of atherosclerotic disease. Also, involved in wound healing. On appropriate medications.   Essential hypertension Likely an underlying cause of her renal failure and blood pressure control important in reducing the progression of atherosclerotic disease. On appropriate oral medications.   CKD (chronic kidney disease), stage V ([place]) Arterial studies today showed normal triphasic flow without obvious stenosis but her vein mapping again shows what appeared to be inadequate cephalic and basilic veins for creation of AV fistula in the left arm.  At this point, dialysis is imminent and it would be appropriate to go ahead and place an AV graft.  Risks and benefits were discussed for AV graft creation.  We will plan on placing a left arm AV graft in the near future at her convenience.      Leotis Pain 12/26/2019, 9:59 AM   This note was created with Dragon medical transcription system.  Any errors from dictation are unintentional.

## 2019-12-26 NOTE — Assessment & Plan Note (Addendum)
Likely an underlying cause of her renal failure and blood glucose control important in reducing the progression of atherosclerotic disease. Also, involved in wound healing. On appropriate medications.

## 2019-12-26 NOTE — Assessment & Plan Note (Signed)
Arterial studies today showed normal triphasic flow without obvious stenosis but her vein mapping again shows what appeared to be inadequate cephalic and basilic veins for creation of AV fistula in the left arm.  At this point, dialysis is imminent and it would be appropriate to go ahead and place an AV graft.  Risks and benefits were discussed for AV graft creation.  We will plan on placing a left arm AV graft in the near future at her convenience.

## 2020-01-05 ENCOUNTER — Telehealth (INDEPENDENT_AMBULATORY_CARE_PROVIDER_SITE_OTHER): Payer: Self-pay

## 2020-01-05 NOTE — Telephone Encounter (Signed)
I spoke to the patient on Friday and she agreed to have surgery on 01/21/20 with Dr. Lucky Cowboy. Patient will do her phone call pre-op on 01/09/20 between 1-5 pm and covid testing on 01/13/20 between 8-1 pm at the [facility]. Pre-surgical instructions will be mailed to the patient. I attempted to contact the patient on Monday as requested but she did not answer.

## 2020-01-06 NOTE — Telephone Encounter (Signed)
Patient called back and all surgical information was given and discussed.

## 2020-01-08 ENCOUNTER — Other Ambulatory Visit (INDEPENDENT_AMBULATORY_CARE_PROVIDER_SITE_OTHER): Payer: Self-pay | Admitting: Nurse Practitioner

## 2020-01-09 ENCOUNTER — Encounter
Admission: RE | Admit: 2020-01-09 | Discharge: 2020-01-09 | Disposition: A | Discharge status: Home / Self Care | Payer: Medicare Other | Source: Ambulatory Visit | Attending: Vascular Surgery | Admitting: Vascular Surgery

## 2020-01-09 ENCOUNTER — Other Ambulatory Visit: Payer: Self-pay

## 2020-01-09 HISTORY — DX: Anemia, unspecified: D64.9

## 2020-01-09 NOTE — Patient Instructions (Signed)
Your procedure is scheduled on: 01/15/20 Report to [facility]. To find out your arrival time please call [phone] between 1PM - 3PM on 01/14/20.  Remember: Instructions that are not followed completely may result in serious medical risk, up to and including death, or upon the discretion of your surgeon and anesthesiologist your surgery may need to be rescheduled.     _X__ 1. Do not eat food after midnight the night before your procedure.                 No gum chewing or hard candies. You may drink clear liquids up to 2 hours                 before you are scheduled to arrive for your surgery- DO not drink clear                 liquids within 2 hours of the start of your surgery.                 Clear Liquids include:  water, apple juice without pulp, clear carbohydrate                 drink such as Clearfast or Gatorade, Black Coffee or Tea (Do not add                 anything to coffee or tea). Diabetics water only  __X__2.  On the morning of surgery brush your teeth with toothpaste and water, you                 may rinse your mouth with mouthwash if you wish.  Do not swallow any              toothpaste of mouthwash.     _X__ 3.  No Alcohol for 24 hours before or after surgery.   _X__ 4.  Do Not Smoke or use e-cigarettes For 24 Hours Prior to Your Surgery.                 Do not use any chewable tobacco products for at least 6 hours prior to                 surgery.  ____  5.  Bring all medications with you on the day of surgery if instructed.   __X__  6.  Notify your doctor if there is any change in your medical condition      (cold, fever, infections).     Do not wear jewelry, make-up, hairpins, clips or nail polish. Do not wear lotions, powders, or perfumes.  Do not shave 48 hours prior to surgery. Men may shave face and neck. Do not bring valuables to the hospital.    [facility] is not responsible for any belongings or  valuables.  Contacts, dentures/partials or body piercings may not be worn into surgery. Bring a case for your contacts, glasses or hearing aids, a denture cup will be supplied. Leave your suitcase in the car. After surgery it may be brought to your room. For patients admitted to the hospital, discharge time is determined by your treatment team.   Patients discharged the day of surgery will not be allowed to drive home.   Please read over the following fact sheets that you were given:   MRSA Information  __X__ Take these medicines the morning of surgery with A SIP OF WATER:  1. NONE  2.   3.   4.  5.  6.  ____ Fleet Enema (as directed)   __X__ Use CHG Soap/SAGE wipes as directed  __X__ Use inhalers on the day of surgery  ____ Stop metformin/Janumet/Farxiga 2 days prior to surgery    ____ Take 1/2 of usual insulin dose the night before surgery. No insulin the morning          of surgery.   ____ Stop Blood Thinners Coumadin/Plavix/Xarelto/Pleta/Pradaxa/Eliquis/Effient/Aspirin  on   Or contact your Surgeon, Cardiologist or Medical Doctor regarding  ability to stop your blood thinners  __X__ Stop Anti-inflammatories 7 days before surgery such as Advil, Ibuprofen, Motrin,  BC or Goodies Powder, Naprosyn, Naproxen, Aleve    __X__ Stop all herbal supplements, fish oil or vitamin E until after surgery.    ____ Bring C-Pap to the hospital.

## 2020-01-10 ENCOUNTER — Ambulatory Visit: Payer: Medicare Other | Attending: Internal Medicine

## 2020-01-10 DIAGNOSIS — Z23 Encounter for immunization: Secondary | ICD-10-CM

## 2020-01-10 NOTE — Progress Notes (Signed)
   Covid-19 Vaccination Clinic  Name:  Krista Gordon    MRN: [id number] DOB: [birth year]  01/10/2020  [name] was observed post Covid-19 immunization for 15 minutes without incident. She was provided with Vaccine Information Sheet and instruction to access the V-Safe system.   [name] was instructed to call 911 with any severe reactions post vaccine: Marland Kitchen Difficulty breathing  . Swelling of face and throat  . A fast heartbeat  . A bad rash all over body  . Dizziness and weakness   Immunizations Administered    Name Date Dose VIS Date Route   Pfizer COVID-19 Vaccine 01/10/2020  8:11 AM 0.3 mL 09/26/2019 Intramuscular   Manufacturer: [place]   Lot: [id number]   [place]: [address]-1

## 2020-01-13 ENCOUNTER — Other Ambulatory Visit: Payer: Self-pay

## 2020-01-13 ENCOUNTER — Other Ambulatory Visit
Admission: RE | Admit: 2020-01-13 | Discharge: 2020-01-13 | Disposition: A | Discharge status: Home / Self Care | Payer: Medicare Other | Source: Ambulatory Visit | Attending: Vascular Surgery | Admitting: Vascular Surgery

## 2020-01-13 DIAGNOSIS — Z01810 Encounter for preprocedural cardiovascular examination: Secondary | ICD-10-CM | POA: Diagnosis present

## 2020-01-13 DIAGNOSIS — Z01812 Encounter for preprocedural laboratory examination: Secondary | ICD-10-CM | POA: Diagnosis present

## 2020-01-13 DIAGNOSIS — Z20822 Contact with and (suspected) exposure to covid-19: Secondary | ICD-10-CM | POA: Diagnosis not present

## 2020-01-13 LAB — CBC WITH DIFFERENTIAL/PLATELET
Abs Immature Granulocytes: 0.02 10*3/uL (ref 0.00–0.07)
Basophils Absolute: 0.1 10*3/uL (ref 0.0–0.1)
Basophils Relative: 1 %
Eosinophils Absolute: 0.5 10*3/uL (ref 0.0–0.5)
Eosinophils Relative: 5 %
HCT: 32.5 % — ABNORMAL LOW (ref 36.0–46.0)
Hemoglobin: 9.6 g/dL — ABNORMAL LOW (ref 12.0–15.0)
Immature Granulocytes: 0 %
Lymphocytes Relative: 24 %
Lymphs Abs: 2.1 10*3/uL (ref 0.7–4.0)
MCH: 28.9 pg (ref 26.0–34.0)
MCHC: 29.5 g/dL — ABNORMAL LOW (ref 30.0–36.0)
MCV: 97.9 fL (ref 80.0–100.0)
Monocytes Absolute: 0.4 10*3/uL (ref 0.1–1.0)
Monocytes Relative: 5 %
Neutro Abs: 5.8 10*3/uL (ref 1.7–7.7)
Neutrophils Relative %: 65 %
Platelets: 243 10*3/uL (ref 150–400)
RBC: 3.32 MIL/uL — ABNORMAL LOW (ref 3.87–5.11)
RDW: 13.1 % (ref 11.5–15.5)
WBC: 8.8 10*3/uL (ref 4.0–10.5)
nRBC: 0 % (ref 0.0–0.2)

## 2020-01-13 LAB — BASIC METABOLIC PANEL
Anion gap: 9 (ref 5–15)
BUN: 61 mg/dL — ABNORMAL HIGH (ref 8–23)
CO2: 25 mmol/L (ref 22–32)
Calcium: 9 mg/dL (ref 8.9–10.3)
Chloride: 107 mmol/L (ref 98–111)
Creatinine, Ser: 6.42 mg/dL — ABNORMAL HIGH (ref 0.44–1.00)
GFR calc Af Amer: 7 mL/min — ABNORMAL LOW (ref >60)
GFR calc non Af Amer: 6 mL/min — ABNORMAL LOW (ref >60)
Glucose, Bld: 103 mg/dL — ABNORMAL HIGH (ref 70–99)
Potassium: 4.8 mmol/L (ref 3.5–5.1)
Sodium: 141 mmol/L (ref 135–145)

## 2020-01-13 LAB — APTT: aPTT: 34 seconds (ref 24–36)

## 2020-01-13 LAB — TYPE AND SCREEN
ABO/RH(D): B POS
Antibody Screen: NEGATIVE

## 2020-01-13 LAB — SARS CORONAVIRUS 2 (TAT 6-24 HRS): SARS Coronavirus 2: NEGATIVE

## 2020-01-13 LAB — PROTIME-INR
INR: 1.1 (ref 0.8–1.2)
Prothrombin Time: 13.6 seconds (ref 11.4–15.2)

## 2020-01-13 NOTE — Pre-Procedure Instructions (Signed)
Pre-Admit Testing Provider Communication Note  Provider:  Dr. Bertell Maria  Communication Mode: Secure Chat  Reason: EKG "No previous EKGs on file anyhere. I did call the PCP office, [facility] who does not participate in EPIC. She saw her PCP on 01/05/20, but she is on vacation this week. There is a MD covering her patients. Do you suggest medical clearance for this patient? DM, HTN, CKD. Now in need of AV fistula placement for dialysis. Surgery scheduled 01/15/20 with Dr. Lucky Cowboy.   Response: "I have reviewed her chart, I do not think having her see an internist will add much benefit. OK from my standpoint to proceed without it "   Additional Information: Noted on Pre-Admit Worksheet.  Signed: Beulah Gandy, RN

## 2020-01-15 ENCOUNTER — Encounter
Admission: RE | Disposition: A | Discharge status: Home / Self Care | Payer: Self-pay | Source: Home / Self Care | Attending: Vascular Surgery

## 2020-01-15 ENCOUNTER — Ambulatory Visit: Payer: Medicare Other | Admitting: Anesthesiology

## 2020-01-15 ENCOUNTER — Ambulatory Visit
Admission: RE | Admit: 2020-01-15 | Discharge: 2020-01-15 | Disposition: A | Discharge status: Home / Self Care | Payer: Medicare Other | Attending: Vascular Surgery | Admitting: Vascular Surgery

## 2020-01-15 ENCOUNTER — Encounter: Payer: Self-pay | Admitting: Vascular Surgery

## 2020-01-15 ENCOUNTER — Other Ambulatory Visit: Payer: Self-pay

## 2020-01-15 ENCOUNTER — Ambulatory Visit: Payer: Medicare Other

## 2020-01-15 DIAGNOSIS — I120 Hypertensive chronic kidney disease with stage 5 chronic kidney disease or end stage renal disease: Secondary | ICD-10-CM | POA: Insufficient documentation

## 2020-01-15 DIAGNOSIS — Z79899 Other long term (current) drug therapy: Secondary | ICD-10-CM | POA: Insufficient documentation

## 2020-01-15 DIAGNOSIS — E1122 Type 2 diabetes mellitus with diabetic chronic kidney disease: Secondary | ICD-10-CM | POA: Diagnosis not present

## 2020-01-15 DIAGNOSIS — Z7982 Long term (current) use of aspirin: Secondary | ICD-10-CM | POA: Diagnosis not present

## 2020-01-15 DIAGNOSIS — Z87891 Personal history of nicotine dependence: Secondary | ICD-10-CM | POA: Insufficient documentation

## 2020-01-15 DIAGNOSIS — E785 Hyperlipidemia, unspecified: Secondary | ICD-10-CM | POA: Diagnosis not present

## 2020-01-15 DIAGNOSIS — M109 Gout, unspecified: Secondary | ICD-10-CM | POA: Diagnosis not present

## 2020-01-15 DIAGNOSIS — N185 Chronic kidney disease, stage 5: Secondary | ICD-10-CM | POA: Diagnosis not present

## 2020-01-15 DIAGNOSIS — I770 Arteriovenous fistula, acquired: Secondary | ICD-10-CM

## 2020-01-15 DIAGNOSIS — Z7984 Long term (current) use of oral hypoglycemic drugs: Secondary | ICD-10-CM | POA: Insufficient documentation

## 2020-01-15 HISTORY — PX: AV FISTULA PLACEMENT: SHX1204

## 2020-01-15 LAB — POCT I-STAT, CHEM 8
BUN: 52 mg/dL — ABNORMAL HIGH (ref 8–23)
Calcium, Ion: 1.13 mmol/L — ABNORMAL LOW (ref 1.15–1.40)
Chloride: 113 mmol/L — ABNORMAL HIGH (ref 98–111)
Creatinine, Ser: 7.1 mg/dL — ABNORMAL HIGH (ref 0.44–1.00)
Glucose, Bld: 86 mg/dL (ref 70–99)
HCT: 31 % — ABNORMAL LOW (ref 36.0–46.0)
Hemoglobin: 10.5 g/dL — ABNORMAL LOW (ref 12.0–15.0)
Potassium: 4.9 mmol/L (ref 3.5–5.1)
Sodium: 142 mmol/L (ref 135–145)
TCO2: 24 mmol/L (ref 22–32)

## 2020-01-15 LAB — ABO/RH: ABO/RH(D): B POS

## 2020-01-15 LAB — GLUCOSE, CAPILLARY
Glucose-Capillary: 74 mg/dL (ref 70–99)
Glucose-Capillary: 66 mg/dL — ABNORMAL LOW (ref 70–99)
Glucose-Capillary: 86 mg/dL (ref 70–99)

## 2020-01-15 SURGERY — INSERTION OF ARTERIOVENOUS (AV) GORE-TEX GRAFT ARM
Anesthesia: General | Laterality: Left

## 2020-01-15 MED ORDER — FIBRIN SEALANT 2 ML SINGLE DOSE KIT
PACK | CUTANEOUS | Status: DC | PRN
  Administered 2020-01-15: 2 mL via TOPICAL

## 2020-01-15 MED ORDER — EPINEPHRINE PF 1 MG/ML IJ SOLN
INTRAMUSCULAR | Status: AC
  Filled 2020-01-15: qty 1

## 2020-01-15 MED ORDER — FAMOTIDINE 20 MG PO TABS: 20.0000 mg | ORAL_TABLET | Freq: Once | ORAL | Status: AC

## 2020-01-15 MED ORDER — MIDAZOLAM HCL 2 MG/2ML IJ SOLN: 1.0000 mg | Freq: Once | INTRAMUSCULAR | Status: AC

## 2020-01-15 MED ORDER — HYDROCODONE-ACETAMINOPHEN 5-325 MG PO TABS: 1.0000 | ORAL_TABLET | Freq: Four times a day (QID) | ORAL | 0 refills | Status: DC | PRN

## 2020-01-15 MED ORDER — HEPARIN SODIUM (PORCINE) 1000 UNIT/ML IJ SOLN
INTRAMUSCULAR | Status: DC | PRN
  Administered 2020-01-15: 3000 [IU] via INTRAVENOUS

## 2020-01-15 MED ORDER — CHLORHEXIDINE GLUCONATE CLOTH 2 % EX PADS: 6.0000 | MEDICATED_PAD | Freq: Once | CUTANEOUS | Status: DC

## 2020-01-15 MED ORDER — FENTANYL CITRATE (PF) 100 MCG/2ML IJ SOLN
INTRAMUSCULAR | Status: AC
  Administered 2020-01-15: 50 ug via INTRAVENOUS
  Filled 2020-01-15: qty 2

## 2020-01-15 MED ORDER — ROPIVACAINE HCL 5 MG/ML IJ SOLN
INTRAMUSCULAR | Status: DC | PRN
  Administered 2020-01-15: 30 mL via PERINEURAL

## 2020-01-15 MED ORDER — ROPIVACAINE HCL 5 MG/ML IJ SOLN
INTRAMUSCULAR | Status: AC
  Filled 2020-01-15: qty 30

## 2020-01-15 MED ORDER — LIDOCAINE HCL (PF) 1 % IJ SOLN
INTRAMUSCULAR | Status: DC | PRN
  Administered 2020-01-15: 5 mL via SUBCUTANEOUS

## 2020-01-15 MED ORDER — FENTANYL CITRATE (PF) 100 MCG/2ML IJ SOLN: 50.0000 ug | Freq: Once | INTRAMUSCULAR | Status: AC

## 2020-01-15 MED ORDER — LIDOCAINE HCL (PF) 1 % IJ SOLN
INTRAMUSCULAR | Status: AC
  Filled 2020-01-15: qty 5

## 2020-01-15 MED ORDER — ONDANSETRON HCL 4 MG/2ML IJ SOLN: 4.0000 mg | Freq: Once | INTRAMUSCULAR | Status: DC | PRN

## 2020-01-15 MED ORDER — FAMOTIDINE 20 MG PO TABS
ORAL_TABLET | ORAL | Status: AC
  Administered 2020-01-15: 20 mg via ORAL
  Filled 2020-01-15: qty 1

## 2020-01-15 MED ORDER — PHENYLEPHRINE HCL (PRESSORS) 10 MG/ML IV SOLN
INTRAVENOUS | Status: DC | PRN
  Administered 2020-01-15 (×2): 100 ug via INTRAVENOUS
  Administered 2020-01-15: 200 ug via INTRAVENOUS
  Administered 2020-01-15: 100 ug via INTRAVENOUS
  Administered 2020-01-15: 150 ug via INTRAVENOUS
  Administered 2020-01-15: 100 ug via INTRAVENOUS

## 2020-01-15 MED ORDER — EPHEDRINE SULFATE 50 MG/ML IJ SOLN
INTRAMUSCULAR | Status: DC | PRN
  Administered 2020-01-15 (×2): 10 mg via INTRAVENOUS

## 2020-01-15 MED ORDER — ONDANSETRON HCL 4 MG/2ML IJ SOLN
INTRAMUSCULAR | Status: DC | PRN
  Administered 2020-01-15: 4 mg via INTRAVENOUS

## 2020-01-15 MED ORDER — LIDOCAINE HCL (PF) 2 % IJ SOLN
INTRAMUSCULAR | Status: AC
  Filled 2020-01-15: qty 5

## 2020-01-15 MED ORDER — MIDAZOLAM HCL 2 MG/2ML IJ SOLN
INTRAMUSCULAR | Status: AC
  Administered 2020-01-15: 1 mg via INTRAVENOUS
  Filled 2020-01-15: qty 2

## 2020-01-15 MED ORDER — CEFAZOLIN SODIUM-DEXTROSE 1-4 GM/50ML-% IV SOLN
INTRAVENOUS | Status: AC
  Filled 2020-01-15: qty 50

## 2020-01-15 MED ORDER — CEFAZOLIN SODIUM-DEXTROSE 1-4 GM/50ML-% IV SOLN
1.0000 g | INTRAVENOUS | Status: AC
  Administered 2020-01-15: 1 g via INTRAVENOUS

## 2020-01-15 MED ORDER — SODIUM CHLORIDE 0.9 % IV SOLN: INTRAVENOUS | Status: DC

## 2020-01-15 MED ORDER — PROPOFOL 10 MG/ML IV BOLUS
INTRAVENOUS | Status: AC
  Filled 2020-01-15: qty 20

## 2020-01-15 MED ORDER — PROPOFOL 10 MG/ML IV BOLUS
INTRAVENOUS | Status: DC | PRN
  Administered 2020-01-15: 150 mg via INTRAVENOUS

## 2020-01-15 MED ORDER — BUPIVACAINE HCL (PF) 0.5 % IJ SOLN
INTRAMUSCULAR | Status: AC
  Filled 2020-01-15: qty 30

## 2020-01-15 MED ORDER — BUPIVACAINE HCL (PF) 0.5 % IJ SOLN
INTRAMUSCULAR | Status: AC
  Filled 2020-01-15: qty 20

## 2020-01-15 MED ORDER — FENTANYL CITRATE (PF) 100 MCG/2ML IJ SOLN: 25.0000 ug | INTRAMUSCULAR | Status: DC | PRN

## 2020-01-15 MED ORDER — HEPARIN SODIUM (PORCINE) 5000 UNIT/ML IJ SOLN
INTRAMUSCULAR | Status: AC
  Filled 2020-01-15: qty 1

## 2020-01-15 MED ORDER — BUPIVACAINE HCL (PF) 0.5 % IJ SOLN
INTRAMUSCULAR | Status: AC
  Filled 2020-01-15: qty 10

## 2020-01-15 MED ORDER — LIDOCAINE HCL (CARDIAC) PF 100 MG/5ML IV SOSY
PREFILLED_SYRINGE | INTRAVENOUS | Status: DC | PRN
  Administered 2020-01-15: 70 mg via INTRAVENOUS

## 2020-01-15 SURGICAL SUPPLY — 55 items
BAG DECANTER FOR FLEXI CONT (MISCELLANEOUS) ×3 IMPLANT
BLADE SURG SZ11 CARB STEEL (BLADE) ×3 IMPLANT
BOOT SUTURE AID YELLOW STND (SUTURE) ×3 IMPLANT
BRUSH SCRUB EZ  4% CHG (MISCELLANEOUS) ×2
BRUSH SCRUB EZ 4% CHG (MISCELLANEOUS) ×1 IMPLANT
CANISTER SUCT 1200ML W/VALVE (MISCELLANEOUS) ×3 IMPLANT
CHLORAPREP W/TINT 26 (MISCELLANEOUS) ×3 IMPLANT
CLIP SPRNG 6 S-JAW DBL (CLIP) ×1 IMPLANT
CLIP SPRNG 6MM S-JAW DBL (CLIP) ×3
COVER WAND RF STERILE (DRAPES) ×3 IMPLANT
DECANTER SPIKE VIAL GLASS SM (MISCELLANEOUS) ×3 IMPLANT
DERMABOND ADVANCED (GAUZE/BANDAGES/DRESSINGS) ×2
DERMABOND ADVANCED .7 DNX12 (GAUZE/BANDAGES/DRESSINGS) ×1 IMPLANT
ELECT CAUTERY BLADE 6.4 (BLADE) ×3 IMPLANT
ELECT REM PT RETURN 9FT ADLT (ELECTROSURGICAL) ×3
ELECTRODE REM PT RTRN 9FT ADLT (ELECTROSURGICAL) ×1 IMPLANT
GLOVE BIO SURGEON STRL SZ7 (GLOVE) ×6 IMPLANT
GLOVE INDICATOR 7.5 STRL GRN (GLOVE) ×3 IMPLANT
GOWN STRL REUS W/ TWL LRG LVL3 (GOWN DISPOSABLE) ×1 IMPLANT
GOWN STRL REUS W/ TWL XL LVL3 (GOWN DISPOSABLE) ×2 IMPLANT
GOWN STRL REUS W/TWL LRG LVL3 (GOWN DISPOSABLE) ×2
GOWN STRL REUS W/TWL XL LVL3 (GOWN DISPOSABLE) ×4
GRAFT PROPATEN STD WALL 6X40 (Vascular Products) ×2 IMPLANT
HEMOSTAT SURGICEL 2X3 (HEMOSTASIS) ×3 IMPLANT
IV NS 500ML (IV SOLUTION) ×2
IV NS 500ML BAXH (IV SOLUTION) ×1 IMPLANT
KIT TURNOVER KIT A (KITS) ×3 IMPLANT
LABEL OR SOLS (LABEL) ×3 IMPLANT
LOOP RED MAXI  1X406MM (MISCELLANEOUS) ×2
LOOP VESSEL MAXI 1X406 RED (MISCELLANEOUS) ×1 IMPLANT
LOOP VESSEL MINI 0.8X406 BLUE (MISCELLANEOUS) ×1 IMPLANT
LOOPS BLUE MINI 0.8X406MM (MISCELLANEOUS) ×2
NDL FILTER BLUNT 18X1 1/2 (NEEDLE) ×1 IMPLANT
NEEDLE FILTER BLUNT 18X 1/2SAF (NEEDLE) ×2
NEEDLE FILTER BLUNT 18X1 1/2 (NEEDLE) ×1 IMPLANT
NS IRRIG 500ML POUR BTL (IV SOLUTION) ×3 IMPLANT
PACK EXTREMITY ARMC (MISCELLANEOUS) ×3 IMPLANT
PAD PREP 24X41 OB/GYN DISP (PERSONAL CARE ITEMS) ×3 IMPLANT
SOLUTION CELL SAVER (CLIP) ×1 IMPLANT
STOCKINETTE 48X4 2 PLY STRL (GAUZE/BANDAGES/DRESSINGS) ×1 IMPLANT
STOCKINETTE STRL 4IN 9604848 (GAUZE/BANDAGES/DRESSINGS) ×3 IMPLANT
SUT GORETEX CV-6TTC-13 36IN (SUTURE) ×6 IMPLANT
SUT MNCRL AB 4-0 PS2 18 (SUTURE) ×3 IMPLANT
SUT PROLENE 6 0 BV (SUTURE) ×12 IMPLANT
SUT SILK 0 SH 30 (SUTURE) ×3 IMPLANT
SUT SILK 2 0 (SUTURE) ×2
SUT SILK 2-0 18XBRD TIE 12 (SUTURE) ×1 IMPLANT
SUT SILK 3 0 (SUTURE) ×2
SUT SILK 3-0 18XBRD TIE 12 (SUTURE) ×1 IMPLANT
SUT SILK 4 0 (SUTURE) ×2
SUT SILK 4-0 18XBRD TIE 12 (SUTURE) ×1 IMPLANT
SUT VIC AB 3-0 SH 27 (SUTURE) ×2
SUT VIC AB 3-0 SH 27X BRD (SUTURE) ×1 IMPLANT
SYR 20ML LL LF (SYRINGE) ×3 IMPLANT
SYR 3ML LL SCALE MARK (SYRINGE) ×3 IMPLANT

## 2020-01-15 NOTE — Anesthesia Procedure Notes (Signed)
Anesthesia Regional Block: Supraclavicular block   Pre-Anesthetic Checklist: ,, timeout performed, Correct Patient, Correct Site, Correct Laterality, Correct Procedure, Correct Position, site marked, Risks and benefits discussed,  Surgical consent,  Pre-op evaluation,  At surgeon's request and post-op pain management  Laterality: Left and Upper  Prep: chloraprep       Needles:  Injection technique: Single-shot  Needle Type: Stimiplex     Needle Length: 5cm  Needle Gauge: 22     Additional Needles:   Procedures:,,,, ultrasound used (permanent image in chart),,,,  Narrative:  Start time: 01/15/2020 11:57 AM End time: 01/15/2020 12:02 PM Injection made incrementally with aspirations every 5 mL.  Performed by: Personally  Anesthesiologist: Martha Clan, MD  Additional Notes: Functioning IV was confirmed and monitors were applied.  A 34mm 22ga Stimuplex needle was used. Sterile prep and drape,hand hygiene and sterile gloves were used.  Negative aspiration and negative test dose prior to incremental administration of local anesthetic. The patient tolerated the procedure well.

## 2020-01-15 NOTE — H&P (Signed)
[facility] History & Physical Update  The patient was interviewed and re-examined.  The patient's previous History and Physical has been reviewed and is unchanged.  There is no change in the plan of care. We plan to proceed with the scheduled procedure.  Leotis Pain, MD  01/15/2020, 11:18 AM

## 2020-01-15 NOTE — Transfer of Care (Signed)
Immediate Anesthesia Transfer of Care Note  Patient: Krista Gordon  Procedure(s) Performed: INSERTION OF ARTERIOVENOUS (AV) GORE-TEX GRAFT ARM (Left )  Patient Location: PACU  Anesthesia Type:General  Level of Consciousness: drowsy  Airway & Oxygen Therapy: Patient Spontanous Breathing  Post-op Assessment: Report given to RN and Post -op Vital signs reviewed and stable  Post vital signs: Reviewed and stable  Last Vitals:  Vitals Value Taken Time  BP 104/50 01/15/20 1421  Temp 36.1 C 01/15/20 1421  Pulse 80 01/15/20 1426  Resp 23 01/15/20 1426  SpO2 99 % 01/15/20 1426  Vitals shown include unvalidated device data.  Last Pain:  Vitals:   01/15/20 1421  TempSrc:   PainSc: Asleep         Complications: No apparent anesthesia complications

## 2020-01-15 NOTE — Op Note (Addendum)
OPERATIVE NOTE   PROCEDURE:  Left brachial artery to brachial vein forearm loop arteriovenous graft with  6 mm Propaten pTFE  PRE-OPERATIVE DIAGNOSIS: 1. ESRD   POST-OPERATIVE DIAGNOSIS: same as above  SURGEON: Krista Pain, MD  ASSISTANT(S): Hezzie Bump, PA-C  ANESTHESIA: general  ESTIMATED BLOOD LOSS: 25 cc  FINDING(S): 1. none  SPECIMEN(S):  none  INDICATIONS:   Krista Gordon is a 79 y.o. female who  presents with ESRD and need for permanent dialysis access.  Risk, benefits, and alternatives to access surgery were discussed.  The difference between AV fistulas and AV grafts were discussed.  The patient is aware the risks include but are not limited to: bleeding, infection, steal syndrome, nerve damage, ischemic monomelic neuropathy, failure to mature, and need for additional procedures.  The patient is aware of the risks and elects to proceed forward. An assistant was present during the procedure to help facilitate the exposure and expedite the procedure.  DESCRIPTION: After full informed written consent was obtained from the patient, the patient was brought back to the operating room and placed supine upon the operating table.  The patientwas given IV antibiotics prior to proceeding.  After obtaining adequate general anesthesia, the patient was prepped and draped in standard fashion.  The assistant provided retraction and mobilization to help facilitate exposure and expedite the procedure throughout the entire procedure.  This included following suture, using retractors, and optimizing lighting.  I made a transverse incision at the antecubital fossa and dissected down through the subcutaneous tissue and fascia.  The brachial artery was dissected proximally and distally and vessel loops placed for control.  The artery was patent and adequate sized for AV graft creation.  One of the brachial veins was in close proximity and was found to be patent and of adequate size for AV graft  creation.  I dissected it out and placed a vessel loop around the vein, and would later control this with bulldog clamps.  I then obtained a 6 mm Propaten pTFE graft, which I stretched to full length to help determine the apex of this looped forearm arteriovenous graft.  I made a transverse incision at the determined apex site and dissected down out a pocket.  I then took a curved metal tunneler and dissected from the antecubital incision up to the apical incision.  I delivered the graft through the metal tunnel, taking care to maintain the orientation of the graft.  I then tunneled from the apex to the antecubital incision, leaving the metal tunnel in place and delivered the remainder of the graft through the tunnel.   I then gave the patient 3000 units of heparin to gain some anticoagulation, and allowed this to circulate for several minutes. I placed the brachial artery under tension proximally and distally with vessel loops.  I made an arteriotomy with a 11 blade and extended it with a Potts scissor for.  The graft was then cut and beveled to match the arteriotomy.  The graft was sewn to the artery with a running CV-6 Goretex suture, in a end-to-side configuration.  Prior to completing the anastomosis, I allowed the artery to backbleed from both ends.  I then  released the vessel loops on the inflow and allowed the artery to decompress through the graft. There was good pulsatile bleeding through this graft.  I then clamped the graft near its arterial anastomosis.  I then suctioned out all the blood in the graft and instilled heparinized saline into the graft.  At this point, I pulled the graft to appropriate tension to remove any redundancy.  I then used the bulldog clamps to control the vein.  An anterior wall venotomy was then made with an 11 blade and extended with Potts scissors.  The graft was then cut an beveled to match the venotomy.  The venous anastomosis was created with a CV-6 suture. Prior to  completing this anastomosis, I allowed the vein to back bleed and then I also allowed the artery to bleed in an antegrade fashion.  I completed this anastomosis in the usual fashion, and irrigated out the wound with sterile saline.  I released all clamps.  There was excellent flow in the graft, and a palpable pulse in the artery beyond the graft. At this point, I washed out the antecubital wound.  I placed Surgicel and Vistacel topical hemostatic agents and hemostasis was complete. The subcutaneous tissue was reapproximated in all incisions with a running stitch of 3-0 Vicryl.  The skin was then reapproximated in all incisions with a running subcuticular 4-0 Monocryl.  The skin was then cleaned and dried at all incisions, and Dermabond used to reinforce the skin closure.  The patient was taken to the recovery room in stable condition having tolerated the procedure well.  COMPLICATIONS: none  CONDITION: stable  Krista Gordon  01/15/2020, 2:14 PM                  This note was created with Dragon Medical transcription system. Any errors in dictation are purely unintentional.

## 2020-01-15 NOTE — Anesthesia Preprocedure Evaluation (Signed)
Anesthesia Evaluation  Patient identified by MRN, date of birth, ID band Patient awake    Reviewed: Allergy & Precautions, H&P , NPO status , Patient's Chart, lab work & pertinent test results  History of Anesthesia Complications Negative for: history of anesthetic complications  Airway Mallampati: III  TM Distance: >3 FB Neck ROM: limited    Dental  (+) Poor Dentition, Missing, Upper Dentures, Lower Dentures   Pulmonary neg shortness of breath, former smoker,    Pulmonary exam normal breath sounds clear to auscultation       Cardiovascular Exercise Tolerance: Good hypertension, (-) angina(-) Past MI and (-) DOE Normal cardiovascular exam(-) dysrhythmias (-) Valvular Problems/Murmurs Rhythm:regular Rate:Normal     Neuro/Psych negative neurological ROS  negative psych ROS   GI/Hepatic negative GI ROS, Neg liver ROS,   Endo/Other  diabetes, Type 2  Renal/GU CRFRenal disease  negative genitourinary   Musculoskeletal   Abdominal   Peds  Hematology negative hematology ROS (+)   Anesthesia Other Findings Past Medical History:   Diabetes mellitus without complication (HCC)                 Gout                                                         Hyperlipidemia                                               Hypertension                                                 Gall stone                                                  Past Surgical History:   TUBAL LIGATION                                               BMI    Body Mass Index   32.91 kg/m 2    Signs and symptoms suggestive of sleep apnea    Reproductive/Obstetrics negative OB ROS                             Anesthesia Physical  Anesthesia Plan  ASA: III  Anesthesia Plan: General   Post-op Pain Management:  Regional for Post-op pain   Induction: Intravenous  PONV Risk Score and Plan: 3 and Ondansetron, Dexamethasone and  Treatment may vary due to age or medical condition  Airway Management Planned: LMA  Additional Equipment:   Intra-op Plan:   Post-operative Plan: Extubation in OR  Informed Consent: I have reviewed the patients History and Physical, chart, labs and discussed the procedure including the risks, benefits and  alternatives for the proposed anesthesia with the patient or authorized representative who has indicated his/her understanding and acceptance.     Dental Advisory Given  Plan Discussed with: Anesthesiologist, CRNA and Surgeon  Anesthesia Plan Comments:         Anesthesia Quick Evaluation

## 2020-01-15 NOTE — Discharge Instructions (Signed)

## 2020-01-15 NOTE — OR Nursing (Signed)
Per Dr. Lucky Cowboy via secure chat, patient may resume aspirin tomorrow 01-16-20, added to med section with discharge instructions.

## 2020-01-15 NOTE — Anesthesia Procedure Notes (Signed)
Procedure Name: LMA Insertion Performed by: Ercole Georg Ben, CRNA Pre-anesthesia Checklist: Patient identified, Emergency Drugs available, Suction available and Patient being monitored Patient Re-evaluated:Patient Re-evaluated prior to induction Oxygen Delivery Method: Circle system utilized Preoxygenation: Pre-oxygenation with 100% oxygen Induction Type: IV induction Ventilation: Mask ventilation without difficulty LMA: LMA inserted LMA Size: 3.5 Tube type: Oral Number of attempts: 1 Airway Equipment and Method: Oral airway Placement Confirmation: positive ETCO2 and breath sounds checked- equal and bilateral Tube secured with: Tape Dental Injury: Teeth and Oropharynx as per pre-operative assessment        

## 2020-01-16 NOTE — Anesthesia Postprocedure Evaluation (Signed)
Anesthesia Post Note  Patient: Krista Gordon  Procedure(s) Performed: INSERTION OF ARTERIOVENOUS (AV) GORE-TEX GRAFT ARM (Left )  Patient location during evaluation: PACU Anesthesia Type: General Level of consciousness: awake and alert Pain management: pain level controlled Vital Signs Assessment: post-procedure vital signs reviewed and stable Respiratory status: spontaneous breathing, nonlabored ventilation and respiratory function stable Cardiovascular status: blood pressure returned to baseline and stable Postop Assessment: no apparent nausea or vomiting Anesthetic complications: no     Last Vitals:  Vitals:   01/15/20 1506 01/15/20 1528  BP: (!) 105/55 (!) 111/51  Pulse: 81 84  Resp: 20 18  Temp: (!) 36.1 C (!) 36.4 C  SpO2: 99% 99%    Last Pain:  Vitals:   01/15/20 1528  TempSrc: Temporal  PainSc: 0-No pain                 Brett Canales Kollyns Mickelson

## 2020-01-16 NOTE — Progress Notes (Signed)
Patient returned phone call this am s/p surgery yesterday.  Patient states area of surgery continues to be painful despite taking narcotic medications provided.  Patient also reports increased swelling this morning.  Patient given Dr. Ozella Almond phone number and instructed to call the office with concerns.

## 2020-01-21 ENCOUNTER — Telehealth (INDEPENDENT_AMBULATORY_CARE_PROVIDER_SITE_OTHER): Payer: Self-pay

## 2020-01-21 NOTE — Telephone Encounter (Signed)
Patient called earlier stating she was having diarrhea issues and wanted to know what could she take. I advised taking Imodium to help and I would ask the NP Eulogio Ditch. Per Arna Medici the patient could take the Imodium and stay hydrated. As I was advising the patient per Arna Medici to see her PCP if it worse the patient stated she has not been able to keep anything down after the surgery. Patient was called back after the NP was told about not keeping anything down. Patient was advised to go to the ED for evaluation due to her not keeping anything down if she gets worse.

## 2020-01-31 ENCOUNTER — Ambulatory Visit: Payer: Medicare Other | Attending: Internal Medicine

## 2020-01-31 DIAGNOSIS — Z23 Encounter for immunization: Secondary | ICD-10-CM

## 2020-01-31 NOTE — Progress Notes (Signed)
   Covid-19 Vaccination Clinic  Name:  GREYSEN SWANTON    MRN: [id number] DOB: 04-15-1941  01/31/2020  Ms. Dagher was observed post Covid-19 immunization for 30 minutes based on pre-vaccination screening   without incident. She was provided with Vaccine Information Sheet and instruction to access the V-Safe system.   Ms. Brummitt was instructed to call 911 with any severe reactions post vaccine: Marland Kitchen Difficulty breathing  . Swelling of face and throat  . A fast heartbeat  . A bad rash all over body  . Dizziness and weakness   Immunizations Administered    Name Date Dose VIS Date Route   Pfizer COVID-19 Vaccine 01/31/2020  8:15 AM 0.3 mL 09/26/2019 Intramuscular   Manufacturer: [place]   Lot: [id number]   [place]: [id number]

## 2020-02-03 ENCOUNTER — Other Ambulatory Visit (INDEPENDENT_AMBULATORY_CARE_PROVIDER_SITE_OTHER): Payer: Self-pay | Admitting: Vascular Surgery

## 2020-02-03 DIAGNOSIS — Z9889 Other specified postprocedural states: Secondary | ICD-10-CM

## 2020-02-03 DIAGNOSIS — Z95828 Presence of other vascular implants and grafts: Secondary | ICD-10-CM

## 2020-02-03 DIAGNOSIS — N186 End stage renal disease: Secondary | ICD-10-CM

## 2020-02-05 ENCOUNTER — Ambulatory Visit (INDEPENDENT_AMBULATORY_CARE_PROVIDER_SITE_OTHER): Payer: Medicare Other | Admitting: Nurse Practitioner

## 2020-02-05 ENCOUNTER — Ambulatory Visit (INDEPENDENT_AMBULATORY_CARE_PROVIDER_SITE_OTHER): Payer: Medicare Other

## 2020-02-05 ENCOUNTER — Other Ambulatory Visit: Payer: Self-pay

## 2020-02-05 ENCOUNTER — Encounter (INDEPENDENT_AMBULATORY_CARE_PROVIDER_SITE_OTHER): Payer: Self-pay | Admitting: Nurse Practitioner

## 2020-02-05 VITALS — BP 150/74 | HR 83 | Resp 14 | Ht 62.0 in | Wt 142.0 lb

## 2020-02-05 DIAGNOSIS — N186 End stage renal disease: Secondary | ICD-10-CM

## 2020-02-05 DIAGNOSIS — I10 Essential (primary) hypertension: Secondary | ICD-10-CM

## 2020-02-05 DIAGNOSIS — N185 Chronic kidney disease, stage 5: Secondary | ICD-10-CM

## 2020-02-05 DIAGNOSIS — Z9889 Other specified postprocedural states: Secondary | ICD-10-CM | POA: Diagnosis not present

## 2020-02-05 DIAGNOSIS — E785 Hyperlipidemia, unspecified: Secondary | ICD-10-CM

## 2020-02-05 DIAGNOSIS — Z95828 Presence of other vascular implants and grafts: Secondary | ICD-10-CM

## 2020-02-09 ENCOUNTER — Encounter (INDEPENDENT_AMBULATORY_CARE_PROVIDER_SITE_OTHER): Payer: Self-pay

## 2020-02-09 ENCOUNTER — Telehealth (INDEPENDENT_AMBULATORY_CARE_PROVIDER_SITE_OTHER): Payer: Self-pay

## 2020-02-09 NOTE — Telephone Encounter (Signed)
Patient returned my call and is now scheduled with Dr. Lucky Cowboy for a left arm shuntogram on 02/16/20 with a 10:00 am arrival time to the [facility]. Patient will do covid testing on 02/12/20 between 8-1 pm at the [facility]. Pre-procedure instructions were discussed and will be mailed.

## 2020-02-09 NOTE — Telephone Encounter (Signed)
I attempted to contact the patient to schedule her for a shuntogram. Per the person that answered the phone I was told the patient was not there and would call me back. I left my name and number for a return call.

## 2020-02-10 ENCOUNTER — Encounter (INDEPENDENT_AMBULATORY_CARE_PROVIDER_SITE_OTHER): Payer: Self-pay | Admitting: Nurse Practitioner

## 2020-02-10 NOTE — Progress Notes (Signed)
Subjective:    Patient ID: Krista Gordon, female    DOB: 1941-05-09, 79 y.o.   MRN: [id number] Chief Complaint  Patient presents with  . Follow-up    ultrasound follow up     The patient returns today for first look at her forearm loop graft.  The patient has not yet started hemodialysis however she will be starting soon.  She denies any uremic-like symptoms.  She denies any fever, chills, nausea, vomiting or diarrhea.  The incision site has healed well.  She denies any TIA or amaurosis fugax.  Today the forearm loop graft has a flow volume of 841.  The graft is patent throughout however there appears to be a stricture in the arterial anastomosis area.  No evidence of thrombosis or clot seen.   Review of Systems  All other systems reviewed and are negative.      Objective:   Physical Exam Vitals reviewed.  Constitutional:      Appearance: Normal appearance.  Cardiovascular:     Rate and Rhythm: Normal rate and regular rhythm.     Pulses:          Radial pulses are 2+ on the left side.     Arteriovenous access: right arteriovenous access is present.    Comments: No thrill palpated, soft bruit Neurological:     Mental Status: She is alert.     BP (!) 150/74 (BP Location: Right Arm)   Pulse 83   Resp 14   Ht 5\' 2"  (1.575 m)   Wt 142 lb (64.4 kg)   BMI 25.97 kg/m   Past Medical History:  Diagnosis Date  . Anemia   . Diabetes mellitus without complication ([place])   . Gall stone   . Gout   . Hyperlipidemia   . Hypertension   . Renal disorder    kidney disease    Social History   Socioeconomic History  . Marital status: Married    Spouse name: Not on file  . Number of children: Not on file  . Years of education: Not on file  . Highest education level: Not on file  Occupational History  . Not on file  Tobacco Use  . Smoking status: Former Research scientist (life sciences)  . Smokeless tobacco: Never Used  Substance and Sexual Activity  . Alcohol use: No  . Drug use: No  .  Sexual activity: Not on file  Other Topics Concern  . Not on file  Social History Narrative  . Not on file   Social Determinants of Health   Financial Resource Strain:   . Difficulty of Paying Living Expenses:   Food Insecurity:   . Worried About Charity fundraiser in the Last Year:   . Arboriculturist in the Last Year:   Transportation Needs:   . Film/video editor (Medical):   Marland Kitchen Lack of Transportation (Non-Medical):   Physical Activity:   . Days of Exercise per Week:   . Minutes of Exercise per Session:   Stress:   . Feeling of Stress :   Social Connections:   . Frequency of Communication with Friends and Family:   . Frequency of Social Gatherings with Friends and Family:   . Attends Religious Services:   . Active Member of Clubs or Organizations:   . Attends Archivist Meetings:   Marland Kitchen Marital Status:   Intimate Partner Violence:   . Fear of Current or Ex-Partner:   . Emotionally Abused:   .  Physically Abused:   . Sexually Abused:     Past Surgical History:  Procedure Laterality Date  . AV FISTULA PLACEMENT Left 01/15/2020   Procedure: INSERTION OF ARTERIOVENOUS (AV) GORE-TEX GRAFT ARM;  Surgeon: Algernon Huxley, MD;  Location: [facility] ORS;  Service: Vascular;  Laterality: Left;  . COLONOSCOPY WITH PROPOFOL N/A 01/27/2016   Procedure: COLONOSCOPY WITH PROPOFOL;  Surgeon: Hulen Luster, MD;  Location: [facility] ENDOSCOPY;  Service: Gastroenterology;  Laterality: N/A;  . TUBAL LIGATION      Family History  Problem Relation Age of Onset  . Breast cancer Sister 57    No Known Allergies     Assessment & Plan:   1. CKD (chronic kidney disease), stage V (HCC) Recommend:  The patient is experiencing increasing problems with their dialysis access.  Patient should have a fistulagram with the intention for intervention.  The intention for intervention is to restore appropriate flow and prevent thrombosis and possible loss of the access.  As well as improve the quality of  dialysis therapy.  The risks, benefits and alternative therapies were reviewed in detail with the patient.  All questions were answered.  The patient agrees to proceed with angio/intervention.      2. Essential hypertension Continue antihypertensive medications as already ordered, these medications have been reviewed and there are no changes at this time.   3. Hyperlipidemia, unspecified hyperlipidemia type Continue statin as ordered and reviewed, no changes at this time    Current Outpatient Medications on File Prior to Visit  Medication Sig Dispense Refill  . alendronate (FOSAMAX) 70 MG tablet Take 70 mg by mouth every Monday. Take with a full glass of water on an empty stomach.     Marland Kitchen amLODipine (NORVASC) 5 MG tablet Take 5 mg by mouth at bedtime.     Marland Kitchen aspirin (ASPIRIN EC) 81 MG EC tablet Take 81 mg by mouth at bedtime. Swallow whole.     Marland Kitchen atorvastatin (LIPITOR) 40 MG tablet Take 40 mg by mouth at bedtime.     . calcitonin, salmon, (MIACALCIN/FORTICAL) 200 UNIT/ACT nasal spray Place 1 spray into alternate nostrils at bedtime.    . Cholecalciferol (VITAMIN D) 2000 units tablet Take 2,000 Units by mouth daily.    . colchicine 0.6 MG tablet Take 0.6 mg by mouth at bedtime.     . ferrous sulfate 325 (65 FE) MG tablet Take 325 mg by mouth daily.     Marland Kitchen glipiZIDE (GLUCOTROL XL) 5 MG 24 hr tablet Take 5 mg by mouth at bedtime.    Marland Kitchen HYDROcodone-acetaminophen (NORCO) 5-325 MG tablet Take 1 tablet by mouth every 6 (six) hours as needed for moderate pain. 30 tablet 0  . linagliptin (TRADJENTA) 5 MG TABS tablet Take 5 mg by mouth at bedtime.     . Multiple Vitamin (MULTIVITAMIN WITH MINERALS) TABS tablet Take 1 tablet by mouth at bedtime.    Marland Kitchen PROAIR HFA 108 (90 Base) MCG/ACT inhaler Inhale 2 puffs into the lungs every 6 (six) hours as needed for shortness of breath.  2  . quinapril (ACCUPRIL) 40 MG tablet Take 40 mg by mouth at bedtime.    . sodium bicarbonate 650 MG tablet Take 650 mg by  mouth daily.   3  . torsemide (DEMADEX) 10 MG tablet Take 10 mg by mouth daily.     No current facility-administered medications on file prior to visit.    There are no Patient Instructions on file for this visit. No follow-ups on  file.   Kris Hartmann, NP

## 2020-02-12 ENCOUNTER — Other Ambulatory Visit
Admission: RE | Admit: 2020-02-12 | Discharge: 2020-02-12 | Disposition: A | Discharge status: Home / Self Care | Payer: Medicare Other | Source: Ambulatory Visit | Attending: Vascular Surgery | Admitting: Vascular Surgery

## 2020-02-12 DIAGNOSIS — Z01812 Encounter for preprocedural laboratory examination: Secondary | ICD-10-CM | POA: Diagnosis present

## 2020-02-12 DIAGNOSIS — Z20822 Contact with and (suspected) exposure to covid-19: Secondary | ICD-10-CM | POA: Insufficient documentation

## 2020-02-12 LAB — SARS CORONAVIRUS 2 (TAT 6-24 HRS): SARS Coronavirus 2: NEGATIVE

## 2020-02-15 ENCOUNTER — Other Ambulatory Visit (INDEPENDENT_AMBULATORY_CARE_PROVIDER_SITE_OTHER): Payer: Self-pay | Admitting: Nurse Practitioner

## 2020-02-16 ENCOUNTER — Encounter: Payer: Self-pay | Admitting: Vascular Surgery

## 2020-02-16 ENCOUNTER — Encounter
Admission: RE | Disposition: A | Discharge status: Home / Self Care | Payer: Self-pay | Source: Ambulatory Visit | Attending: Vascular Surgery

## 2020-02-16 ENCOUNTER — Ambulatory Visit
Admission: RE | Admit: 2020-02-16 | Discharge: 2020-02-16 | Disposition: A | Discharge status: Home / Self Care | Payer: Medicare Other | Source: Ambulatory Visit | Attending: Vascular Surgery | Admitting: Vascular Surgery

## 2020-02-16 ENCOUNTER — Other Ambulatory Visit: Payer: Self-pay

## 2020-02-16 DIAGNOSIS — Y832 Surgical operation with anastomosis, bypass or graft as the cause of abnormal reaction of the patient, or of later complication, without mention of misadventure at the time of the procedure: Secondary | ICD-10-CM | POA: Insufficient documentation

## 2020-02-16 DIAGNOSIS — Z79899 Other long term (current) drug therapy: Secondary | ICD-10-CM | POA: Insufficient documentation

## 2020-02-16 DIAGNOSIS — E785 Hyperlipidemia, unspecified: Secondary | ICD-10-CM | POA: Diagnosis not present

## 2020-02-16 DIAGNOSIS — T82858A Stenosis of vascular prosthetic devices, implants and grafts, initial encounter: Secondary | ICD-10-CM | POA: Diagnosis present

## 2020-02-16 DIAGNOSIS — Z992 Dependence on renal dialysis: Secondary | ICD-10-CM | POA: Diagnosis not present

## 2020-02-16 DIAGNOSIS — I120 Hypertensive chronic kidney disease with stage 5 chronic kidney disease or end stage renal disease: Secondary | ICD-10-CM | POA: Diagnosis not present

## 2020-02-16 DIAGNOSIS — D649 Anemia, unspecified: Secondary | ICD-10-CM | POA: Insufficient documentation

## 2020-02-16 DIAGNOSIS — T82898A Other specified complication of vascular prosthetic devices, implants and grafts, initial encounter: Secondary | ICD-10-CM | POA: Diagnosis not present

## 2020-02-16 DIAGNOSIS — Z7983 Long term (current) use of bisphosphonates: Secondary | ICD-10-CM | POA: Diagnosis not present

## 2020-02-16 DIAGNOSIS — E1122 Type 2 diabetes mellitus with diabetic chronic kidney disease: Secondary | ICD-10-CM | POA: Insufficient documentation

## 2020-02-16 DIAGNOSIS — Z95828 Presence of other vascular implants and grafts: Secondary | ICD-10-CM | POA: Diagnosis not present

## 2020-02-16 DIAGNOSIS — Z7984 Long term (current) use of oral hypoglycemic drugs: Secondary | ICD-10-CM | POA: Insufficient documentation

## 2020-02-16 DIAGNOSIS — M109 Gout, unspecified: Secondary | ICD-10-CM | POA: Diagnosis not present

## 2020-02-16 DIAGNOSIS — Z7982 Long term (current) use of aspirin: Secondary | ICD-10-CM | POA: Diagnosis not present

## 2020-02-16 DIAGNOSIS — N185 Chronic kidney disease, stage 5: Secondary | ICD-10-CM | POA: Diagnosis not present

## 2020-02-16 DIAGNOSIS — Z87891 Personal history of nicotine dependence: Secondary | ICD-10-CM | POA: Diagnosis not present

## 2020-02-16 DIAGNOSIS — N186 End stage renal disease: Secondary | ICD-10-CM

## 2020-02-16 HISTORY — PX: A/V SHUNTOGRAM: CATH118297

## 2020-02-16 SURGERY — A/V SHUNTOGRAM
Anesthesia: Moderate Sedation | Laterality: Left

## 2020-02-16 MED ORDER — MIDAZOLAM HCL 5 MG/5ML IJ SOLN
INTRAMUSCULAR | Status: AC
  Filled 2020-02-16: qty 5

## 2020-02-16 MED ORDER — FENTANYL CITRATE (PF) 100 MCG/2ML IJ SOLN
INTRAMUSCULAR | Status: AC
  Filled 2020-02-16: qty 2

## 2020-02-16 MED ORDER — DIPHENHYDRAMINE HCL 50 MG/ML IJ SOLN: 50.0000 mg | Freq: Once | INTRAMUSCULAR | Status: DC | PRN

## 2020-02-16 MED ORDER — ONDANSETRON HCL 4 MG/2ML IJ SOLN: 4.0000 mg | Freq: Four times a day (QID) | INTRAMUSCULAR | Status: DC | PRN

## 2020-02-16 MED ORDER — SODIUM CHLORIDE 0.9 % IV SOLN: INTRAVENOUS | Status: DC

## 2020-02-16 MED ORDER — MIDAZOLAM HCL 2 MG/2ML IJ SOLN
INTRAMUSCULAR | Status: DC | PRN
  Administered 2020-02-16 (×2): 1 mg via INTRAVENOUS

## 2020-02-16 MED ORDER — HYDROMORPHONE HCL 1 MG/ML IJ SOLN: 1.0000 mg | Freq: Once | INTRAMUSCULAR | Status: DC | PRN

## 2020-02-16 MED ORDER — HEPARIN SODIUM (PORCINE) 1000 UNIT/ML IJ SOLN
INTRAMUSCULAR | Status: DC | PRN
  Administered 2020-02-16: 3000 [IU] via INTRAVENOUS

## 2020-02-16 MED ORDER — FAMOTIDINE 20 MG PO TABS: 40.0000 mg | ORAL_TABLET | Freq: Once | ORAL | Status: DC | PRN

## 2020-02-16 MED ORDER — METHYLPREDNISOLONE SODIUM SUCC 125 MG IJ SOLR: 125.0000 mg | Freq: Once | INTRAMUSCULAR | Status: DC | PRN

## 2020-02-16 MED ORDER — FENTANYL CITRATE (PF) 100 MCG/2ML IJ SOLN
INTRAMUSCULAR | Status: DC | PRN
  Administered 2020-02-16 (×2): 25 ug via INTRAVENOUS

## 2020-02-16 MED ORDER — CEFAZOLIN SODIUM-DEXTROSE 1-4 GM/50ML-% IV SOLN
INTRAVENOUS | Status: AC
  Filled 2020-02-16: qty 50

## 2020-02-16 MED ORDER — HEPARIN SODIUM (PORCINE) 1000 UNIT/ML IJ SOLN
INTRAMUSCULAR | Status: AC
  Filled 2020-02-16: qty 1

## 2020-02-16 MED ORDER — CEFAZOLIN SODIUM-DEXTROSE 1-4 GM/50ML-% IV SOLN
1.0000 g | Freq: Once | INTRAVENOUS | Status: AC
  Administered 2020-02-16: 1 g via INTRAVENOUS

## 2020-02-16 MED ORDER — MIDAZOLAM HCL 2 MG/ML PO SYRP: 8.0000 mg | ORAL_SOLUTION | Freq: Once | ORAL | Status: DC | PRN

## 2020-02-16 SURGICAL SUPPLY — 11 items
BALLN LUTONIX DCB 5X60X130 (BALLOONS) ×3
BALLOON LUTONIX DCB 5X60X130 (BALLOONS) IMPLANT
CANNULA 5F STIFF (CANNULA) ×2 IMPLANT
CATH BEACON 5 .035 40 KMP TP (CATHETERS) IMPLANT
CATH BEACON 5 .038 40 KMP TP (CATHETERS) ×2
DEVICE PRESTO INFLATION (MISCELLANEOUS) ×2 IMPLANT
GLIDEWIRE STIFF .35X180X3 HYDR (WIRE) ×2 IMPLANT
PACK ANGIOGRAPHY (CUSTOM PROCEDURE TRAY) ×2 IMPLANT
SHEATH BRITE TIP 6FRX5.5 (SHEATH) ×2 IMPLANT
SUT MNCRL AB 4-0 PS2 18 (SUTURE) ×4 IMPLANT
WIRE MAGIC TORQUE 260C (WIRE) ×2 IMPLANT

## 2020-02-16 NOTE — H&P (Signed)
[facility] History & Physical Update  The patient was interviewed and re-examined.  The patient's previous History and Physical has been reviewed and is unchanged.  There is no change in the plan of care. We plan to proceed with the scheduled procedure.  Leotis Pain, MD  02/16/2020, 11:00 AM

## 2020-02-16 NOTE — Op Note (Signed)
[facility]    OPERATIVE NOTE   PROCEDURE: 1.  Left brachial artery to brachial vein arteriovenous graft cannulation under ultrasound guidance 2.  Left arm shuntogram 3.  Percutaneous transluminal angioplasty of the brachial artery just proximal to the graft as well as the arterial anastomosis with 5 mm diameter by 6 cm length Lutonix drug-coated angioplasty balloon  PRE-OPERATIVE DIAGNOSIS: 1. ESRD 2. Malfunctioning left brachial artery to brachial vein arteriovenous graft 3.  Steal syndrome left arm  POST-OPERATIVE DIAGNOSIS: same as above   SURGEON: Leotis Pain, MD  ANESTHESIA: local with MCS  ESTIMATED BLOOD LOSS: 5 cc  FINDING(S): 1. 70-75% stenosis of the brachial artery just proximal to the graft anastomosis.  Essentially no flow was going distally in the arterial circuit beyond the graft consistent with significant steal syndrome.  65 to 70% stenosis of the venous anastomosis and brachial vein.  SPECIMEN(S):  None  CONTRAST: 20 cc  FLUORO TIME: 1.8 minutes  MODERATE CONSCIOUS SEDATION TIME:  Approximately 20 minutes using 2 mg of Versed and 50 mcg of Fentanyl  INDICATIONS: Krista Gordon is a 79 y.o. female who presents with malfunctioning left brachial artery to brachial vein arteriovenous graft.  She also has some symptoms concerning for steal syndrome of the left hand the patient is scheduled for left arm shuntogram.  The patient is aware the risks include but are not limited to: bleeding, infection, thrombosis of the cannulated access, and possible anaphylactic reaction to the contrast.  The patient is aware of the risks of the procedure and elects to proceed forward.  DESCRIPTION: After full informed written consent was obtained, the patient was brought back to the angiography suite and placed supine upon the angiography table.  The patient was connected to monitoring equipment. Moderate conscious sedation was administered during a face to  face encounter throughout the procedure with my supervision of the RN administering medicines and monitoring the patient's vital signs, pulse oximetry, telemetry and mental status throughout from the start of the procedure until the patient was taken to the recovery room The left arm was prepped and draped in the standard fashion for a percutaneous access intervention.  Under ultrasound guidance, the left brachial artery to brachial vein arteriovenous graft was cannulated with a micropuncture needle under direct ultrasound guidance in a retrograde fashion were it was patent and a permanent image was performed.  The microwire was advanced into the graft and the needle was exchanged for the a microsheath.  I then upsized to a 6 Fr Sheath.  A Kumpe catheter and Glidewire were then advanced into the brachial artery about 10 cm proximal to the anastomosis and imaging was performed. This demonstrated 70-75% stenosis of the brachial artery just proximal to the graft anastomosis.  Essentially no flow was going distally in the arterial circuit beyond the graft consistent with significant steal syndrome.  65 to 70% stenosis of the venous anastomosis and brachial vein.  I then advanced the Kumpe catheter down to the brachial artery just distal to the anastomosis by flipping around with a Glidewire and performed imaging.  There is still very poor flow with most of the flow going out the graft consistent with steal syndrome.  Based on the images, this patient will need intervention to the brachial artery and anastomotic stenosis.  As this was proximal to the graft I was hoping this would also help her distal arm symptoms.  I then gave the patient 3000 units of intravenous heparin.  I  did not want to address the venous anastomotic stenosis due to the steal syndrome knowing that would worsen the steal symptoms  I then exchanged for a Magic Tourqe wire.  Based on the imaging, a 5 mm x 6 cm Lutonix drug-coated angioplasty balloon  was selected.  The balloon was centered around the stenosis and inflated to 8 ATM for 1 minute(s).  On completion imaging, a 10-15% residual stenosis was present.  With the catheter proximal to the anastomosis to the brachial artery, almost no blood flow still going in the arterial system distantly so she appeared to have significant residual steal symptoms.  This would not be able to be addressed from the approach today, and if her symptoms clinically persist, she will be brought back for a groin access left upper extremity angiogram.   Based on the completion imaging, no further intervention is necessary today.  The wire and balloon were removed from the sheath.  A 4-0 Monocryl purse-string suture was sewn around the sheath.  The sheath was removed while tying down the suture.  A sterile bandage was applied to the puncture site.  COMPLICATIONS: None  CONDITION: Stable   Leotis Pain  02/16/2020 12:18 PM    This note was created with Dragon Medical transcription system. Any errors in dictation are purely unintentional.

## 2020-02-23 ENCOUNTER — Ambulatory Visit (INDEPENDENT_AMBULATORY_CARE_PROVIDER_SITE_OTHER): Payer: Medicare Other | Admitting: Nurse Practitioner

## 2020-02-23 ENCOUNTER — Other Ambulatory Visit: Payer: Self-pay

## 2020-02-23 ENCOUNTER — Encounter (INDEPENDENT_AMBULATORY_CARE_PROVIDER_SITE_OTHER): Payer: Self-pay | Admitting: Nurse Practitioner

## 2020-02-23 VITALS — BP 116/59 | HR 92 | Ht 62.0 in | Wt 145.0 lb

## 2020-02-23 DIAGNOSIS — E785 Hyperlipidemia, unspecified: Secondary | ICD-10-CM

## 2020-02-23 DIAGNOSIS — N185 Chronic kidney disease, stage 5: Secondary | ICD-10-CM

## 2020-02-23 DIAGNOSIS — I10 Essential (primary) hypertension: Secondary | ICD-10-CM

## 2020-02-23 NOTE — Progress Notes (Signed)
Subjective:    Patient ID: Krista Gordon, female    DOB: 02-11-1941, 79 y.o.   MRN: [id number] Chief Complaint  Patient presents with  . Follow-up    1 wek [facility] F/U No studies     The patient returns for follow-up today after fistulogram on 02/16/2020.  The patient had no stricture in her arterial anastomosis.  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  Prior to the intervention the patient states that her hand was numb the majority of the time.  She denies any significant pain or coolness of her extremities.  Today, however she reports that her numbness has essentially gone away and she has less symptoms than before.  She still continues to deny any discoloration of her fingertips or coolness of her fingertips.  Based on operative notes, the patient still has a narrowing at the venous anastomosis however repairing that would likely have made her steal syndrome worse.  The patient has not yet begun dialysis.  She has an upcoming appointment with her nephrologist to determine if she will need to begin dialysis soon.  Otherwise the patient denies any issues following her procedure.   Review of Systems  Neurological: Positive for numbness.  All other systems reviewed and are negative.      Objective:   Physical Exam Vitals reviewed.  Constitutional:      Appearance: Normal appearance.  Cardiovascular:     Rate and Rhythm: Normal rate and regular rhythm.     Pulses:          Radial pulses are 1+ on the right side and 0 on the left side.     Arteriovenous access: left arteriovenous access is present.    Comments: Good thrill and bruit Pulmonary:     Effort: Pulmonary effort is normal.     Breath sounds: Normal breath sounds.  Musculoskeletal:        General: Normal range of motion.  Skin:    Capillary Refill: Capillary refill takes 2 to 3 seconds.  Neurological:     Mental Status: She is alert.     BP (!) 116/59   Pulse 92    Ht 5\' 2"  (1.575 m)   Wt 145 lb (65.8 kg)   BMI 26.52 kg/m   Past Medical History:  Diagnosis Date  . Anemia   . Diabetes mellitus without complication ([place])   . Gall stone   . Gout   . Hyperlipidemia   . Hypertension   . Renal disorder    kidney disease    Social History   Socioeconomic History  . Marital status: Married    Spouse name: Not on file  . Number of children: Not on file  . Years of education: Not on file  . Highest education level: Not on file  Occupational History  . Not on file  Tobacco Use  . Smoking status: Former Research scientist (life sciences)  . Smokeless tobacco: Never Used  Substance and Sexual Activity  . Alcohol use: No  . Drug use: No  . Sexual activity: Not on file  Other Topics Concern  . Not on file  Social History Narrative  . Not on file   Social Determinants of Health   Financial Resource Strain:   . Difficulty of Paying Living Expenses:   Food Insecurity:   . Worried About Charity fundraiser in the Last Year:   . [place] in the Last Year:  Transportation Needs:   . Film/video editor (Medical):   Marland Kitchen Lack of Transportation (Non-Medical):   Physical Activity:   . Days of Exercise per Week:   . Minutes of Exercise per Session:   Stress:   . Feeling of Stress :   Social Connections:   . Frequency of Communication with Friends and Family:   . Frequency of Social Gatherings with Friends and Family:   . Attends Religious Services:   . Active Member of Clubs or Organizations:   . Attends Archivist Meetings:   Marland Kitchen Marital Status:   Intimate Partner Violence:   . Fear of Current or Ex-Partner:   . Emotionally Abused:   Marland Kitchen Physically Abused:   . Sexually Abused:     Past Surgical History:  Procedure Laterality Date  . A/V SHUNTOGRAM Left 02/16/2020   Procedure: A/V SHUNTOGRAM;  Surgeon: Algernon Huxley, MD;  Location: [facility];  Service: Cardiovascular;  Laterality: Left;  . AV FISTULA PLACEMENT Left 01/15/2020    Procedure: INSERTION OF ARTERIOVENOUS (AV) GORE-TEX GRAFT ARM;  Surgeon: Algernon Huxley, MD;  Location: [facility];  Service: Vascular;  Laterality: Left;  . COLONOSCOPY WITH PROPOFOL N/A 01/27/2016   Procedure: COLONOSCOPY WITH PROPOFOL;  Surgeon: Hulen Luster, MD;  Location: [facility];  Service: Gastroenterology;  Laterality: N/A;  . TUBAL LIGATION      Family History  Problem Relation Age of Onset  . Breast cancer Sister 61    No Known Allergies     Assessment & Plan:   1. CKD (chronic kidney disease), stage V ([place]) We will have the patient return in 3 months with noninvasive studies to evaluate her graft.  If the patient is found to be needing dialysis prior to follow-up her graft is able to be utilized at this time.  The patient is advised that the numbness or pain may return when she actually begins dialysis due to the steal syndrome.  If this does happen the patient will need a left upper extremity angiogram utilizing groin access in order to treat the steal syndrome.  I discussed with patient that typically with graft still syndrome tends to get better over the years.  The major determining factor will be when she gets on dialysis how her hand feels.  If she notices constant numbness or pain, discoloration of her fingertips or ulcerations she should contact her office so that we can plan for intervention.  Otherwise we will see the patient at her 38-month follow-up.  2. Essential hypertension Continue antihypertensive medications as already ordered, these medications have been reviewed and there are no changes at this time.   3. Hyperlipidemia, unspecified hyperlipidemia type Continue statin as ordered and reviewed, no changes at this time    Current Outpatient Medications on File Prior to Visit  Medication Sig Dispense Refill  . alendronate (FOSAMAX) 70 MG tablet Take 70 mg by mouth every Monday. Take with a full glass of water on an empty stomach.     Marland Kitchen amLODipine (NORVASC) 5 MG  tablet Take 5 mg by mouth at bedtime.     Marland Kitchen aspirin (ASPIRIN EC) 81 MG EC tablet Take 81 mg by mouth at bedtime. Swallow whole.     Marland Kitchen atorvastatin (LIPITOR) 40 MG tablet Take 40 mg by mouth at bedtime.     . calcitonin, salmon, (MIACALCIN/FORTICAL) 200 UNIT/ACT nasal spray Place 1 spray into alternate nostrils at bedtime.    . Cholecalciferol (VITAMIN D) 2000 units tablet  Take 2,000 Units by mouth daily.    . colchicine 0.6 MG tablet Take 0.6 mg by mouth at bedtime.     . ferrous sulfate 325 (65 FE) MG tablet Take 325 mg by mouth daily.     Marland Kitchen glipiZIDE (GLUCOTROL XL) 5 MG 24 hr tablet Take 5 mg by mouth at bedtime.    Marland Kitchen linagliptin (TRADJENTA) 5 MG TABS tablet Take 5 mg by mouth at bedtime.     . Multiple Vitamin (MULTIVITAMIN WITH MINERALS) TABS tablet Take 1 tablet by mouth at bedtime.    Marland Kitchen PROAIR HFA 108 (90 Base) MCG/ACT inhaler Inhale 2 puffs into the lungs every 6 (six) hours as needed for shortness of breath.  2  . quinapril (ACCUPRIL) 40 MG tablet Take 40 mg by mouth at bedtime.    . sodium bicarbonate 650 MG tablet Take 650 mg by mouth daily.   3  . torsemide (DEMADEX) 10 MG tablet Take 10 mg by mouth daily.    Marland Kitchen HYDROcodone-acetaminophen (NORCO) 5-325 MG tablet Take 1 tablet by mouth every 6 (six) hours as needed for moderate pain. (Patient not taking: Reported on 02/16/2020) 30 tablet 0   No current facility-administered medications on file prior to visit.    There are no Patient Instructions on file for this visit. No follow-ups on file.   Kris Hartmann, NP

## 2020-05-21 ENCOUNTER — Other Ambulatory Visit (INDEPENDENT_AMBULATORY_CARE_PROVIDER_SITE_OTHER): Payer: Self-pay | Admitting: Nurse Practitioner

## 2020-05-21 DIAGNOSIS — N185 Chronic kidney disease, stage 5: Secondary | ICD-10-CM

## 2020-05-24 ENCOUNTER — Ambulatory Visit (INDEPENDENT_AMBULATORY_CARE_PROVIDER_SITE_OTHER): Payer: Medicare Other | Admitting: Nurse Practitioner

## 2020-05-24 ENCOUNTER — Encounter (INDEPENDENT_AMBULATORY_CARE_PROVIDER_SITE_OTHER): Payer: Medicare Other

## 2021-03-14 ENCOUNTER — Encounter: Payer: Self-pay | Admitting: Emergency Medicine

## 2021-03-14 ENCOUNTER — Other Ambulatory Visit: Payer: Self-pay

## 2021-03-14 ENCOUNTER — Emergency Department
Admission: EM | Admit: 2021-03-14 | Discharge: 2021-03-14 | Disposition: A | Discharge status: Home / Self Care | Payer: Medicare Other | Attending: Emergency Medicine | Admitting: Emergency Medicine

## 2021-03-14 DIAGNOSIS — E119 Type 2 diabetes mellitus without complications: Secondary | ICD-10-CM | POA: Insufficient documentation

## 2021-03-14 DIAGNOSIS — W260XXA Contact with knife, initial encounter: Secondary | ICD-10-CM | POA: Diagnosis not present

## 2021-03-14 DIAGNOSIS — Z7984 Long term (current) use of oral hypoglycemic drugs: Secondary | ICD-10-CM | POA: Diagnosis not present

## 2021-03-14 DIAGNOSIS — Z79899 Other long term (current) drug therapy: Secondary | ICD-10-CM | POA: Diagnosis not present

## 2021-03-14 DIAGNOSIS — N185 Chronic kidney disease, stage 5: Secondary | ICD-10-CM | POA: Insufficient documentation

## 2021-03-14 DIAGNOSIS — Z7982 Long term (current) use of aspirin: Secondary | ICD-10-CM | POA: Diagnosis not present

## 2021-03-14 DIAGNOSIS — I120 Hypertensive chronic kidney disease with stage 5 chronic kidney disease or end stage renal disease: Secondary | ICD-10-CM | POA: Insufficient documentation

## 2021-03-14 DIAGNOSIS — S61411A Laceration without foreign body of right hand, initial encounter: Secondary | ICD-10-CM | POA: Diagnosis not present

## 2021-03-14 DIAGNOSIS — Z87891 Personal history of nicotine dependence: Secondary | ICD-10-CM | POA: Diagnosis not present

## 2021-03-14 DIAGNOSIS — S6991XA Unspecified injury of right wrist, hand and finger(s), initial encounter: Secondary | ICD-10-CM | POA: Diagnosis present

## 2021-03-14 MED ORDER — LIDOCAINE HCL (PF) 1 % IJ SOLN
5.0000 mL | Freq: Once | INTRAMUSCULAR | Status: AC
  Administered 2021-03-14: 5 mL via INTRADERMAL
  Filled 2021-03-14: qty 5

## 2021-03-14 NOTE — ED Triage Notes (Signed)
Presents with laceration to right hand  Stats she tried to catch a knife that she dropped   Laceration to palm of hand

## 2021-03-14 NOTE — ED Provider Notes (Signed)
[facility] Emergency Department Provider Note  ____________________________________________   Event Date/Time   First MD Initiated Contact with Patient 03/14/21 1134     (approximate)  I have reviewed the triage vital signs and the nursing notes.   HISTORY  Chief Complaint Laceration (/)    HPI Krista Gordon is a 80 y.o. female presents emergency department with a laceration to the right hand.  Patient dropped a knife and cut across the palm of her hand.  No loss of motion of her fingers.  No numbness or tingling.  Tdap is up-to-date    Past Medical History:  Diagnosis Date  . Anemia   . Diabetes mellitus without complication ([place])   . Gall stone   . Gout   . Hyperlipidemia   . Hypertension   . Renal disorder    kidney disease    Patient Active Problem List   Diagnosis Date Noted  . 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 Nephrolithiasis 06/20/2018  . Chronic kidney disease, stage III (moderate) ([place]) 06/20/2018  . Diabetes ([place]) 06/20/2018  . Essential hypertension 06/20/2018  . Hematuria 06/20/2018  . HLD (hyperlipidemia) 06/20/2018  . Proteinuria 06/20/2018    Past Surgical History:  Procedure Laterality Date  . A/V SHUNTOGRAM Left 02/16/2020   Procedure: A/V SHUNTOGRAM;  Surgeon: Algernon Huxley, MD;  Location: [place] [facility];  Service: Cardiovascular;  Laterality: Left;  . AV FISTULA PLACEMENT Left 01/15/2020   Procedure: INSERTION OF ARTERIOVENOUS (AV) GORE-TEX GRAFT ARM;  Surgeon: Algernon Huxley, MD;  Location: [facility];  Service: Vascular;  Laterality: Left;  . COLONOSCOPY WITH PROPOFOL N/A 01/27/2016   Procedure: COLONOSCOPY WITH PROPOFOL;  Surgeon: Hulen Luster, MD;  Location: [facility];  Service: Gastroenterology;  Laterality: N/A;  . TUBAL LIGATION      Prior to Admission medications   Medication Sig Start Date End Date Taking? Authorizing Provider  alendronate (FOSAMAX) 70 MG tablet Take 70 mg by  mouth every Monday. Take with a full glass of water on an empty stomach.     [provider]  amLODipine (NORVASC) 5 MG tablet Take 5 mg by mouth at bedtime.     [provider]  aspirin (ASPIRIN EC) 81 MG EC tablet Take 81 mg by mouth at bedtime. Swallow whole.     [provider]  atorvastatin (LIPITOR) 40 MG tablet Take 40 mg by mouth at bedtime.     [provider]  calcitonin, salmon, (MIACALCIN/FORTICAL) 200 UNIT/ACT nasal spray Place 1 spray into alternate nostrils at bedtime.    [provider]  Cholecalciferol (VITAMIN D) 2000 units tablet Take 2,000 Units by mouth daily.    [provider]  colchicine 0.6 MG tablet Take 0.6 mg by mouth at bedtime.     [provider]  ferrous sulfate 325 (65 FE) MG tablet Take 325 mg by mouth daily.     [provider]  glipiZIDE (GLUCOTROL XL) 5 MG 24 hr tablet Take 5 mg by mouth at bedtime.    [provider]  HYDROcodone-acetaminophen (NORCO) 5-325 MG tablet Take 1 tablet by mouth every 6 (six) hours as needed for moderate pain. Patient not taking: Reported on 02/16/2020 01/15/20   Algernon Huxley, MD  linagliptin (TRADJENTA) 5 MG TABS tablet Take 5 mg by mouth at bedtime.     [provider]  Multiple Vitamin (MULTIVITAMIN WITH MINERALS) TABS tablet Take 1 tablet by mouth at  bedtime.    [provider]  PROAIR HFA 108 [phone] Base) MCG/ACT inhaler Inhale 2 puffs into the lungs every 6 (six) hours as needed for shortness of breath. 06/15/18   [provider]  quinapril (ACCUPRIL) 40 MG tablet Take 40 mg by mouth at bedtime.    [provider]  sodium bicarbonate 650 MG tablet Take 650 mg by mouth daily.  04/01/18   [provider]  torsemide (DEMADEX) 10 MG tablet Take 10 mg by mouth daily.    [provider]    Allergies Patient has no known allergies.  Family History  Problem Relation Age of Onset  . Breast cancer Sister 3     Social History Social History   Tobacco Use  . Smoking status: Former Research scientist (life sciences)  . Smokeless tobacco: Never Used  Vaping Use  . Vaping Use: Never used  Substance Use Topics  . Alcohol use: No  . Drug use: No    Review of Systems  Constitutional: No fever/chills Eyes: No visual changes. ENT: No sore throat. Respiratory: Denies cough Cardiovascular: Denies chest pain Gastrointestinal: Denies abdominal pain Genitourinary: Negative for dysuria. Musculoskeletal: Negative for back pain. Skin: Negative for rash.  Positive laceration Psychiatric: no mood changes,     ____________________________________________   PHYSICAL EXAM:  VITAL SIGNS: ED Triage Vitals  Enc Vitals Group     BP 03/14/21 1129 (!) 120/52     Pulse Rate 03/14/21 1129 78     Resp 03/14/21 1129 18     Temp 03/14/21 1129 98 F (36.7 C)     Temp Source 03/14/21 1129 Oral     SpO2 03/14/21 1129 98 %     Weight 03/14/21 1131 143 lb (64.9 kg)     Height 03/14/21 1131 5\' 2"  (1.575 m)     Head Circumference --      Peak Flow --      Pain Score 03/14/21 1130 1     Pain Loc --      Pain Edu? --      Excl. in [place]? --     Constitutional: Alert and oriented. Well appearing and in no acute distress. Eyes: Conjunctivae are normal.  Head: Atraumatic. Nose: No congestion/rhinnorhea. Mouth/Throat: Mucous membranes are moist.   Neck:  supple no lymphadenopathy noted Cardiovascular: Normal rate, regular rhythm. Respiratory: Normal respiratory effort.  No retractions,  GU: deferred Musculoskeletal: FROM all extremities, warm and well perfused, right hand has a 4 cm laceration along the thenar area of the palm.  No foreign body noted.  No tendon involvement Neurologic:  Normal speech and language.  Skin:  Skin is warm, dry . No rash noted. Psychiatric: Mood and affect are normal. Speech and behavior are normal.  ____________________________________________   LABS (all labs ordered are listed, but only  abnormal results are displayed)  Labs Reviewed - No data to display ____________________________________________   ____________________________________________  RADIOLOGY    ____________________________________________   PROCEDURES  Procedure(s) performed:   Marland KitchenMarland KitchenLaceration Repair  Date/Time: 03/14/2021 12:41 PM Performed by: Versie Starks, PA-C Authorized by: Versie Starks, PA-C   Consent:    Consent obtained:  Verbal   Consent given by:  Patient   Risks, benefits, and alternatives were discussed: yes     Risks discussed:  Infection, pain, retained foreign body, tendon damage, poor cosmetic result, need for additional repair, nerve damage, poor wound healing and vascular damage   Alternatives discussed:  No treatment Universal protocol:    Procedure  explained and questions answered to patient or proxy's satisfaction: yes     Patient identity confirmed:  Verbally with patient Anesthesia:    Anesthesia method:  Local infiltration   Local anesthetic:  Lidocaine 1% w/o epi Laceration details:    Location:  Hand   Hand location:  R palm   Length (cm):  4 Pre-procedure details:    Preparation:  Patient was prepped and draped in usual sterile fashion Exploration:    Hemostasis achieved with:  Direct pressure   Imaging outcome: foreign body not noted     Wound extent: no fascia violation noted, no foreign bodies/material noted, no muscle damage noted, no tendon damage noted, no underlying fracture noted and no vascular damage noted     Contaminated: no   Treatment:    Area cleansed with:  Povidone-iodine and saline   Amount of cleaning:  Standard   Irrigation solution:  Sterile saline   Irrigation method:  Tap   Debridement:  None   Undermining:  None Skin repair:    Repair method:  Sutures   Suture size:  5-0   Suture material:  Nylon   Suture technique:  Simple interrupted   Number of sutures:  7 Approximation:    Approximation:  Close Repair type:    Repair  type:  Simple Post-procedure details:    Dressing:  Non-adherent dressing   Procedure completion:  Tolerated well, no immediate complications      ____________________________________________   INITIAL IMPRESSION / ASSESSMENT AND PLAN / ED COURSE  Pertinent labs & imaging results that were available during my care of the patient were reviewed by me and considered in my medical decision making (see chart for details).   The patient is a 79 year old female presents emergency department with laceration to the right hand.  See HPI.  Physical exam shows patient appears stable.  See procedure note for laceration repair.  Patient is to follow-up with her regular doctor in approximately 10 days for suture removal.  Keep the areas clean and dry as possible.  Return if worsening.  She was discharged in stable condition in the care of her daughter.     Krista Gordon was evaluated in Emergency Department on 03/14/2021 for the symptoms described in the history of present illness. She was evaluated in the context of the global COVID-19 pandemic, which necessitated consideration that the patient might be at risk for infection with the SARS-CoV-2 virus that causes COVID-19. Institutional protocols and algorithms that pertain to the evaluation of patients at risk for COVID-19 are in a state of rapid change based on information released by regulatory bodies including the CDC and federal and state organizations. These policies and algorithms were followed during the patient's care in the ED.    As part of my medical decision making, I reviewed the following data within the [place] History obtained from family, Nursing notes reviewed and incorporated, Old chart reviewed, Notes from prior ED visits and [place] Controlled Substance Database  ____________________________________________   FINAL CLINICAL IMPRESSION(S) / ED DIAGNOSES  Final diagnoses:  Laceration of right hand without  foreign body, initial encounter      NEW MEDICATIONS STARTED DURING THIS VISIT:  Discharge Medication List as of 03/14/2021 12:39 PM       Note:  This document was prepared using Dragon voice recognition software and may include unintentional dictation errors.    Versie Starks, PA-C 03/14/21 1526    Lavonia Drafts, MD 03/15/21 1248

## 2021-03-14 NOTE — Discharge Instructions (Addendum)
Follow-up with your regular doctor for suture removal in 7 to 10 days.  Return emergency department worsening. Keep the areas clean and dry as possible.  Keep it covered when in public.  You can allow the oxygen/air to get to skin  when at home as this will aid in healing Return if any sign of infection

## 2021-07-04 ENCOUNTER — Encounter: Payer: Self-pay | Admitting: Urology

## 2021-07-04 ENCOUNTER — Other Ambulatory Visit: Payer: Self-pay

## 2021-07-04 ENCOUNTER — Ambulatory Visit (INDEPENDENT_AMBULATORY_CARE_PROVIDER_SITE_OTHER): Payer: Medicare Other | Admitting: Urology

## 2021-07-04 VITALS — BP 173/84 | HR 82 | Ht 62.0 in | Wt 143.0 lb

## 2021-07-04 DIAGNOSIS — N1330 Unspecified hydronephrosis: Secondary | ICD-10-CM | POA: Diagnosis not present

## 2021-07-04 DIAGNOSIS — N3946 Mixed incontinence: Secondary | ICD-10-CM

## 2021-07-04 DIAGNOSIS — R319 Hematuria, unspecified: Secondary | ICD-10-CM | POA: Diagnosis not present

## 2021-07-04 LAB — URINALYSIS, COMPLETE
Bilirubin, UA: NEGATIVE
Glucose, UA: NEGATIVE
Ketones, UA: NEGATIVE
Nitrite, UA: NEGATIVE
Specific Gravity, UA: 1.02 (ref 1.005–1.030)
Urobilinogen, Ur: 0.2 mg/dL (ref 0.2–1.0)
pH, UA: 7.5 (ref 5.0–7.5)

## 2021-07-04 LAB — MICROSCOPIC EXAMINATION: WBC, UA: >30 /hpf — AB (ref 0–5)

## 2021-07-04 NOTE — Progress Notes (Signed)
07/04/2021 10:46 AM   Krista Gordon Krista Gordon November 20, 1940 [id number]  Referring provider: Marguerita Merles, [place],  [place] [address]  Chief Complaint  Patient presents with   Bladder Prolapse    HPI: I was consulted to assess the patient's prolapse.  She feels vaginal bulging and sometimes reduces it.  She is not had a hysterectomy.  She sometimes leaks with coughing sneezing but not bending lifting.  Sometimes she has urge incontinence.  She wears 2 pads a day that are damp.  She does not have enuresis.  Her flow is sometimes good sometimes poor  She voids 3 times a day and 3 times at night  She has been on hemodialysis for 2 years.  There was thought that she might be on the transplant list but she does not know any further detail  She has not had bladder surgery.  She has had surgery for a kidney stone years ago.  She does not get bladder infections.  She is to be a diabetic and that she lost weight.  Bowel function normal   PMH: Past Medical History:  Diagnosis Date   Anemia    Diabetes mellitus without complication ([place])    Gall stone    Gout    Hyperlipidemia    Hypertension    Renal disorder    kidney disease    Surgical History: Past Surgical History:  Procedure Laterality Date   A/V SHUNTOGRAM Left 02/16/2020   Procedure: A/V SHUNTOGRAM;  Surgeon: Algernon Huxley, MD;  Location: [place] [facility];  Service: Cardiovascular;  Laterality: Left;   AV FISTULA PLACEMENT Left 01/15/2020   Procedure: INSERTION OF ARTERIOVENOUS (AV) GORE-TEX GRAFT ARM;  Surgeon: Algernon Huxley, MD;  Location: [facility] OR[facility];  Service: Vascular;  Laterality: Left;   COLONOSCOPY WITH PROPOFOL N/A 01/27/2016   Procedure: COLONOSCOPY WITH PROPOFOL;  Surgeon: Hulen Luster, MD;  Location: [facility] ENDOSCOPY;  Service: Gastroenterology;  Laterality: N/A;   TUBAL LIGATION      Home Medications:  Allergies as of 07/04/2021   No Known Allergies      Medication List        Accurate as of  July 04, 2021 10:46 AM. If you have any questions, ask your nurse or doctor.          alendronate 70 MG tablet Commonly known as: FOSAMAX Take 70 mg by mouth every Monday. Take with a full glass of water on an empty stomach.   amLODipine 5 MG tablet Commonly known as: NORVASC Take 5 mg by mouth at bedtime.   aspirin 81 MG EC tablet Take 81 mg by mouth at bedtime. Swallow whole.   atorvastatin 40 MG tablet Commonly known as: LIPITOR Take 40 mg by mouth at bedtime.   calcitonin (salmon) 200 UNIT/ACT nasal spray Commonly known as: MIACALCIN/FORTICAL Place 1 spray into alternate nostrils at bedtime.   colchicine 0.6 MG tablet Take 0.6 mg by mouth at bedtime.   ferrous sulfate 325 (65 FE) MG tablet Take 325 mg by mouth daily.   glipiZIDE 5 MG 24 hr tablet Commonly known as: GLUCOTROL XL Take 5 mg by mouth at bedtime.   HYDROcodone-acetaminophen 5-325 MG tablet Commonly known as: Norco Take 1 tablet by mouth every 6 (six) hours as needed for moderate pain.   linagliptin 5 MG Tabs tablet Commonly known as: TRADJENTA Take 5 mg by mouth at bedtime.   multivitamin with minerals Tabs tablet Take 1 tablet by mouth at  bedtime.   ProAir HFA 108 (90 Base) MCG/ACT inhaler Generic drug: albuterol Inhale 2 puffs into the lungs every 6 (six) hours as needed for shortness of breath.   quinapril 40 MG tablet Commonly known as: ACCUPRIL Take 40 mg by mouth at bedtime.   sodium bicarbonate 650 MG tablet Take 650 mg by mouth daily.   torsemide 10 MG tablet Commonly known as: DEMADEX Take 10 mg by mouth daily.   Vitamin D 50 MCG (2000 UT) tablet Take 2,000 Units by mouth daily.        Allergies: No Known Allergies  Family History: Family History  Problem Relation Age of Onset   Breast cancer Sister 26    Social History:  reports that she has quit smoking. She has never used smokeless tobacco. She reports that she does not drink alcohol and does not use  drugs.  ROS:                                        Physical Exam: BP (!) 173/84   Pulse 82   Ht 5\' 2"  (1.575 m)   Wt 64.9 kg   BMI 26.16 kg/m   Constitutional:  Alert and oriented, No acute distress. HEENT: [place] AT, moist mucus membranes.  Trachea midline, no masses. Cardiovascular: No clubbing, cyanosis, or edema. Respiratory: Normal respiratory effort, no increased work of breathing. GI: Abdomen is soft, nontender, nondistended, no abdominal masses GU: Pelvic examination patient had complete uterine vault prolapse.  She lost the majority of her length anteriorly and only had approximately 3 cm in length posteriorly at full descensus.  The cervix look normal.  It was easy to reduce the prolapse.  She had a mild cystocele with the apex reduced and some shortening of the anterior vaginal wall.  She had no distal rectocele with a vault  Skin: No rashes, bruises or suspicious lesions. Lymph: No cervical or inguinal adenopathy. Neurologic: Grossly intact, no focal deficits, moving all 4 extremities. Psychiatric: Normal mood and affect.  Laboratory Data: Lab Results  Component Value Date   WBC 8.8 01/13/2020   HGB 10.5 (L) 01/15/2020   HCT 31.0 (L) 01/15/2020   MCV 97.9 01/13/2020   PLT 243 01/13/2020    Lab Results  Component Value Date   CREATININE 7.10 (H) 01/15/2020    No results found for: PSA  No results found for: TESTOSTERONE  No results found for: HGBA1C  Urinalysis    Component Value Date/Time   COLORURINE YELLOW (A) 01/06/2019 1722   APPEARANCEUR CLOUDY (A) 01/06/2019 1722   LABSPEC 1.011 01/06/2019 1722   PHURINE 5.0 01/06/2019 1722   GLUCOSEU NEGATIVE 01/06/2019 1722   HGBUR MODERATE (A) 01/06/2019 1722   BILIRUBINUR NEGATIVE 01/06/2019 [place] 01/06/2019 1722   PROTEINUR 100 (A) 01/06/2019 1722   NITRITE NEGATIVE 01/06/2019 1722   LEUKOCYTESUR LARGE (A) 01/06/2019 1722    Pertinent Imaging: Urine reviewed.   Urine sent for culture.  Chart reviewed  Assessment & Plan: Patient has mild mixed incontinence.  She has uterine vault prolapse.  She is on hemodialysis.  She has some medical morbidities.  She had a CT scan in 2020 demonstrating no hydronephrosis and severe right renal scarring and atrophy.  She had a normal left kidney.  Picture drawn.  Role of repeat CT scan to assess for silent hydronephrosis discussed.  Role of urodynamics discussed.  I  mentioned a robotic sacrocolpopexy with hysterectomy likely the best operation for her.  She would not need a distal rectocele repair.  She would need medical clearance but it appears she is in reasonable health recognizing she is on chronic.  She will need leak point pressures with and without the prolapse reduced during urodynamics.  Pessary and watchful waiting discussed.  She would like to pursue treatment because it is uncomfortable to sit.  She is a very nice patient I think she understood things well today and that eventually surgery would be done in [place] by my partner  There are no diagnoses linked to this encounter.  No follow-ups on file.  Reece Packer, MD  [facility] [address] [place], [place] [address] [phone]

## 2021-07-09 LAB — CULTURE, URINE COMPREHENSIVE

## 2021-08-03 ENCOUNTER — Ambulatory Visit
Admission: RE | Admit: 2021-08-03 | Discharge: 2021-08-03 | Disposition: A | Discharge status: Home / Self Care | Payer: Medicare Other | Source: Ambulatory Visit | Attending: Urology | Admitting: Urology

## 2021-08-03 ENCOUNTER — Other Ambulatory Visit: Payer: Self-pay

## 2021-08-03 DIAGNOSIS — N1330 Unspecified hydronephrosis: Secondary | ICD-10-CM | POA: Insufficient documentation

## 2021-08-08 ENCOUNTER — Other Ambulatory Visit: Payer: Self-pay

## 2021-08-08 ENCOUNTER — Ambulatory Visit (INDEPENDENT_AMBULATORY_CARE_PROVIDER_SITE_OTHER): Payer: Medicare Other | Admitting: Urology

## 2021-08-08 VITALS — BP 144/81 | HR 80 | Wt 139.8 lb

## 2021-08-08 DIAGNOSIS — N3946 Mixed incontinence: Secondary | ICD-10-CM | POA: Diagnosis not present

## 2021-08-08 NOTE — Progress Notes (Signed)
08/08/2021 9:29 AM   Krista Gordon Mar 12, 1941 [id number]  Referring provider: Marguerita Merles, [place],  [place] [address]  No chief complaint on file.   HPI: I was consulted to assess the patient's prolapse.  She feels vaginal bulging and sometimes reduces it.  She is not had a hysterectomy.  She sometimes leaks with coughing sneezing but not bending lifting.  Sometimes she has urge incontinence.  She wears 2 pads a day that are damp.  She does not have enuresis.  Her flow is sometimes good sometimes poor  She voids 3 times a day and 3 times at night  She has been on hemodialysis for 2 years.  There was thought that she might be on the transplant list but she does not know any further detail  She has not had bladder surgery.  She has had surgery for a kidney stone years ago.  She does not get bladder infections.  She is to be a diabetic and that she lost weight.  Bowel function normal    Pelvic examination patient had complete uterine vault prolapse.  She lost the majority of her length anteriorly and only had approximately 3 cm in length posteriorly at full descensus.  The cervix look normal.  It was easy to reduce the prolapse.  She had a mild cystocele with the apex reduced and some shortening of the anterior vaginal wall.  She had no distal rectocele with a vault   Patient has mild mixed incontinence.  She has uterine vault prolapse.  She is on hemodialysis.  She has some medical morbidities.  She had a CT scan in 2020 demonstrating no hydronephrosis and severe right renal scarring and atrophy.  She had a normal left kidney.  Picture drawn.  Role of repeat CT scan to assess for silent hydronephrosis discussed.  Role of urodynamics discussed.  I mentioned a robotic sacrocolpopexy with hysterectomy likely the best operation for her.  She would not need a distal rectocele repair.  She would need medical clearance but it appears she is in reasonable health  recognizing she is on chronic.  She will need leak point pressures with and without the prolapse reduced during urodynamics.  Pessary and watchful waiting discussed.  She would like to pursue treatment because it is uncomfortable to sit.  She is a very nice patient I think she understood things well today and that eventually surgery would be done in [place] by my partner  Today Frequency stable.  Prolapse stable.  I reviewed the CT scan.  She had a small scarred right kidney.  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  She had stones.  Left kidney normal.  She never had   PMH: Past Medical History:  Diagnosis Date   Anemia    Diabetes mellitus without complication ([place])    Gall stone    Gout    Hyperlipidemia    Hypertension    Renal disorder    kidney disease    Surgical History: Past Surgical History:  Procedure Laterality Date   A/V SHUNTOGRAM Left 02/16/2020   Procedure: A/V SHUNTOGRAM;  Surgeon: Algernon Huxley, MD;  Location: [place] [facility];  Service: Cardiovascular;  Laterality: Left;   AV FISTULA PLACEMENT Left 01/15/2020   Procedure: INSERTION OF ARTERIOVENOUS (AV) GORE-TEX GRAFT ARM;  Surgeon: Algernon Huxley, MD;  Location: [facility] ORS;  Service: Vascular;  Laterality: Left;   COLONOSCOPY WITH PROPOFOL N/A 01/27/2016  Procedure: COLONOSCOPY WITH PROPOFOL;  Surgeon: Hulen Luster, MD;  Location: [facility] ENDOSCOPY;  Service: Gastroenterology;  Laterality: N/A;   TUBAL LIGATION      Home Medications:  Allergies as of 08/08/2021   No Known Allergies      Medication List        Accurate as of August 08, 2021  9:29 AM. If you have any questions, ask your nurse or doctor.          alendronate 70 MG tablet Commonly known as: FOSAMAX Take 70 mg by mouth every Monday. Take with a full glass of water on an empty stomach.   amLODipine 5 MG tablet Commonly known as: NORVASC Take 5 mg by mouth at bedtime.   aspirin 81 MG EC tablet Take 81 mg by mouth  at bedtime. Swallow whole.   atorvastatin 40 MG tablet Commonly known as: LIPITOR Take 40 mg by mouth at bedtime.   calcitonin (salmon) 200 UNIT/ACT nasal spray Commonly known as: MIACALCIN/FORTICAL Place 1 spray into alternate nostrils at bedtime.   colchicine 0.6 MG tablet Take 0.6 mg by mouth at bedtime.   ferrous sulfate 325 (65 FE) MG tablet Take 325 mg by mouth daily.   glipiZIDE 5 MG 24 hr tablet Commonly known as: GLUCOTROL XL Take 5 mg by mouth at bedtime.   HYDROcodone-acetaminophen 5-325 MG tablet Commonly known as: Norco Take 1 tablet by mouth every 6 (six) hours as needed for moderate pain.   linagliptin 5 MG Tabs tablet Commonly known as: TRADJENTA Take 5 mg by mouth at bedtime.   multivitamin with minerals Tabs tablet Take 1 tablet by mouth at bedtime.   ProAir HFA 108 (90 Base) MCG/ACT inhaler Generic drug: albuterol Inhale 2 puffs into the lungs every 6 (six) hours as needed for shortness of breath.   quinapril 40 MG tablet Commonly known as: ACCUPRIL Take 40 mg by mouth at bedtime.   sodium bicarbonate 650 MG tablet Take 650 mg by mouth daily.   torsemide 10 MG tablet Commonly known as: DEMADEX Take 10 mg by mouth daily.   Vitamin D 50 MCG (2000 UT) tablet Take 2,000 Units by mouth daily.        Allergies: No Known Allergies  Family History: Family History  Problem Relation Age of Onset   Breast cancer Sister 4    Social History:  reports that she has quit smoking. She has never used smokeless tobacco. She reports that she does not drink alcohol and does not use drugs.  ROS:                                        Physical Exam: There were no vitals taken for this visit.  Constitutional:  Alert and oriented, No acute distress. HEENT: [place] AT, moist mucus membranes.  Trachea midline, no masses.  Laboratory Data: Lab Results  Component Value Date   WBC 8.8 01/13/2020   HGB 10.5 (L) 01/15/2020   HCT 31.0  (L) 01/15/2020   MCV 97.9 01/13/2020   PLT 243 01/13/2020    Lab Results  Component Value Date   CREATININE 7.10 (H) 01/15/2020    No results found for: PSA  No results found for: TESTOSTERONE  No results found for: HGBA1C  Urinalysis    Component Value Date/Time   COLORURINE YELLOW (A) 01/06/2019 1722   APPEARANCEUR Cloudy (A) 07/04/2021 1054   LABSPEC  1.011 01/06/2019 1722   PHURINE 5.0 01/06/2019 1722   GLUCOSEU Negative 07/04/2021 1054   HGBUR MODERATE (A) 01/06/2019 1722   BILIRUBINUR Negative 07/04/2021 1054   [place] 01/06/2019 1722   PROTEINUR 3+ (A) 07/04/2021 1054   PROTEINUR 100 (A) 01/06/2019 1722   NITRITE Negative 07/04/2021 1054   NITRITE NEGATIVE 01/06/2019 1722   LEUKOCYTESUR 1+ (A) 07/04/2021 1054   LEUKOCYTESUR LARGE (A) 01/06/2019 1722    Pertinent Imaging:   Assessment & Plan: Picture was drawn.  Follow left kidney conservatively.  I will make sure that the urodynamics are done.  I will speak to my staff.  I will get the patient to eventually follow-up to Dr. Louis Meckel.  I had no details why the test had not been done.  There are no diagnoses linked to this encounter.  No follow-ups on file.  Reece Packer, MD  [facility] [address], [place], [place] [address] [phone]

## 2021-08-25 ENCOUNTER — Other Ambulatory Visit: Payer: Self-pay | Admitting: Urology

## 2021-09-05 ENCOUNTER — Other Ambulatory Visit: Payer: Self-pay

## 2021-09-05 ENCOUNTER — Encounter: Payer: Self-pay | Admitting: Urology

## 2021-09-05 ENCOUNTER — Ambulatory Visit (INDEPENDENT_AMBULATORY_CARE_PROVIDER_SITE_OTHER): Payer: Medicare Other | Admitting: Urology

## 2021-09-05 VITALS — BP 146/78 | HR 70 | Ht 62.0 in | Wt 139.0 lb

## 2021-09-05 DIAGNOSIS — N8111 Cystocele, midline: Secondary | ICD-10-CM

## 2021-09-05 NOTE — Progress Notes (Signed)
09/05/2021 9:45 AM   Rometta Emery Memory Argue Jun 28, 1941 [id number]  Referring provider: Marguerita Merles, [place],  [place] [address]  Chief Complaint  Patient presents with   Follow-up    HPI: I was consulted to assess the patient's prolapse.  She feels vaginal bulging and sometimes reduces it.  She is not had a hysterectomy.  She sometimes leaks with coughing sneezing but not bending lifting.  Sometimes she has urge incontinence.  She wears 2 pads a day that are damp.  She does not have enuresis.  Her flow is sometimes good sometimes poor  She voids 3 times a day and 3 times at night  She has been on hemodialysis for 2 years.  There was thought that she might be on the transplant list but she does not know any further detail  She has not had bladder surgery.  She has had surgery for a kidney stone years ago.  She does not get bladder infections.  She is to be a diabetic and that she lost weight.  Bowel function normal     Pelvic examination patient had complete uterine vault prolapse.  She lost the majority of her length anteriorly and only had approximately 3 cm in length posteriorly at full descensus.  The cervix look normal.  It was easy to reduce the prolapse.  She had a mild cystocele with the apex reduced and some shortening of the anterior vaginal wall.  She had no distal rectocele with a vault    Patient has mild mixed incontinence.  She has uterine vault prolapse.  She is on hemodialysis.  She has some medical morbidities.  She had a CT scan in 2020 demonstrating no hydronephrosis and severe right renal scarring and atrophy.  She had a normal left kidney.  Picture drawn.  Role of repeat CT scan to assess for silent hydronephrosis discussed.  Role of urodynamics discussed.  I mentioned a robotic sacrocolpopexy with hysterectomy likely the best operation for her.  She would not need a distal rectocele repair.  She would need medical clearance but it appears she is in  reasonable health recognizing she is on chronic.  She will need leak point pressures with and without the prolapse reduced during urodynamics.  Pessary and watchful waiting discussed.  She would like to pursue treatment because it is uncomfortable to sit.  She is a very nice patient I think she understood things well today and that eventually surgery would be done in [place] by my partner   Today Frequency stable.  Prolapse stable.  I reviewed the CT scan.  She had a small scarred right kidney.  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  She had stones.  Left kidney normal.  She had not had urodynamics for unknown reason  Picture was drawn.  Follow left kidney conservatively.  I will make sure that the urodynamics are done.  I will speak to my staff.  I will get the patient to eventually follow-up to Dr. Louis Meckel.  Today Frequency stable.  Prolapse stable.  On urodynamics patient had not voided and was catheterized for a few milliliters.  Maximum bladder capacity was 250 mL.  Bladder was unstable reaching pressures of 77 cm of water.  She felt urgency and leaked a moderate amount with the contractions.  The bladder overactivity was impressive and repetitive.  Some of the contractions were triggered by Valsalva.  It was difficult to get enough fluid in her bladder  to get leak point pressures.  She was having a lot of bladder overactivity with leakage.  No stress incontinence with a Valsalva pressure 75 cm water with prolapse reduced.  During voluntary voiding she voided 63 mL.  Maximum voiding pressure was 13 cm water.  Residual was 135 mL.  I do believe she had an after contraction noted reaching a pressure 33 cm of water.  Bladder neck descended 2 to 3 cm.  Some of the contraction she did not feel another she felt a lot of urgency.  He was actually triggering with positional changes as well.  The details of the urodynamics are signed and dictated  PMH: Past Medical History:   Diagnosis Date   Anemia    Diabetes mellitus without complication ([place])    Gall stone    Gout    Hyperlipidemia    Hypertension    Renal disorder    kidney disease    Surgical History: Past Surgical History:  Procedure Laterality Date   A/V SHUNTOGRAM Left 02/16/2020   Procedure: A/V SHUNTOGRAM;  Surgeon: Algernon Huxley, MD;  Location: [place] [facility];  Service: Cardiovascular;  Laterality: Left;   AV FISTULA PLACEMENT Left 01/15/2020   Procedure: INSERTION OF ARTERIOVENOUS (AV) GORE-TEX GRAFT ARM;  Surgeon: Algernon Huxley, MD;  Location: [facility] ORS;  Service: Vascular;  Laterality: Left;   COLONOSCOPY WITH PROPOFOL N/A 01/27/2016   Procedure: COLONOSCOPY WITH PROPOFOL;  Surgeon: Hulen Luster, MD;  Location: [facility] ENDOSCOPY;  Service: Gastroenterology;  Laterality: N/A;   TUBAL LIGATION      Home Medications:  Allergies as of 09/05/2021   No Known Allergies      Medication List        Accurate as of September 05, 2021  9:45 AM. If you have any questions, ask your nurse or doctor.          alendronate 70 MG tablet Commonly known as: FOSAMAX Take 70 mg by mouth every Monday. Take with a full glass of water on an empty stomach.   amLODipine 5 MG tablet Commonly known as: NORVASC Take 5 mg by mouth at bedtime.   aspirin 81 MG EC tablet Take 81 mg by mouth at bedtime. Swallow whole.   atorvastatin 40 MG tablet Commonly known as: LIPITOR Take 40 mg by mouth at bedtime.   calcitonin (salmon) 200 UNIT/ACT nasal spray Commonly known as: MIACALCIN/FORTICAL Place 1 spray into alternate nostrils at bedtime.   colchicine 0.6 MG tablet Take 0.6 mg by mouth at bedtime.   ferrous sulfate 325 (65 FE) MG tablet Take 325 mg by mouth daily.   glipiZIDE 5 MG 24 hr tablet Commonly known as: GLUCOTROL XL Take 5 mg by mouth at bedtime.   HYDROcodone-acetaminophen 5-325 MG tablet Commonly known as: Norco Take 1 tablet by mouth every 6 (six) hours as needed for moderate pain.    linagliptin 5 MG Tabs tablet Commonly known as: TRADJENTA Take 5 mg by mouth at bedtime.   multivitamin with minerals Tabs tablet Take 1 tablet by mouth at bedtime.   ProAir HFA 108 (90 Base) MCG/ACT inhaler Generic drug: albuterol Inhale 2 puffs into the lungs every 6 (six) hours as needed for shortness of breath.   quinapril 40 MG tablet Commonly known as: ACCUPRIL Take 40 mg by mouth at bedtime.   sodium bicarbonate 650 MG tablet Take 650 mg by mouth daily.   torsemide 10 MG tablet Commonly known as: DEMADEX Take 10 mg by mouth daily.  Vitamin D 50 MCG (2000 UT) tablet Take 2,000 Units by mouth daily.        Allergies: No Known Allergies  Family History: Family History  Problem Relation Age of Onset   Breast cancer Sister 73    Social History:  reports that she has quit smoking. She has never used smokeless tobacco. She reports that she does not drink alcohol and does not use drugs.  ROS:                                        Physical Exam: BP (!) 146/78   Pulse 70   Ht 5\' 2"  (1.575 m)   Wt 63 kg   BMI 25.42 kg/m   Constitutional:  Alert and oriented, No acute distress. HEENT: [place] AT, moist mucus membranes.  Trachea midline, no masses.  Laboratory Data: Lab Results  Component Value Date   WBC 8.8 01/13/2020   HGB 10.5 (L) 01/15/2020   HCT 31.0 (L) 01/15/2020   MCV 97.9 01/13/2020   PLT 243 01/13/2020    Lab Results  Component Value Date   CREATININE 7.10 (H) 01/15/2020    No results found for: PSA  No results found for: TESTOSTERONE  No results found for: HGBA1C  Urinalysis    Component Value Date/Time   COLORURINE YELLOW (A) 01/06/2019 1722   APPEARANCEUR Cloudy (A) 07/04/2021 1054   LABSPEC 1.011 01/06/2019 1722   PHURINE 5.0 01/06/2019 1722   GLUCOSEU Negative 07/04/2021 1054   HGBUR MODERATE (A) 01/06/2019 1722   BILIRUBINUR Negative 07/04/2021 1054   KETONESUR NEGATIVE 01/06/2019 1722   PROTEINUR 3+  (A) 07/04/2021 1054   PROTEINUR 100 (A) 01/06/2019 1722   NITRITE Negative 07/04/2021 1054   NITRITE NEGATIVE 01/06/2019 1722   LEUKOCYTESUR 1+ (A) 07/04/2021 1054   LEUKOCYTESUR LARGE (A) 01/06/2019 1722    Pertinent Imaging:   Assessment & Plan: Patient has uterine vault prolapse.  She will be referred to my partner for robotic sacrocolpopexy.  At baseline she has mild mixed incontinence with based on upon urodynamics she has significant overactive bladder.  She understands that surgery could unmask her mixed incontinence.  This could be of different severities.  It could be treated with medical and behavioral therapy or even stress incontinence surgery in the future.  My personal opinion does not to do a sling at the same time as her prolapse repair.  I think should be very pleased that the prolapse is reduced well and we can proceed accordingly if her mixed incontinence continues or worsens.  In a small percentage cases it can improve after prolapse surgery.  I will copy this note into my chart in [facility] for Dr. Louis Meckel  Conservative management versus pessary versus surgery once again was gone over with patient and daughter but she like to proceed with surgery.  She understands she would need medical clearance.  I will set up the referral  There are no diagnoses linked to this encounter.  No follow-ups on file.  Reece Packer, MD  [facility] [address], [place], [place] [address] [phone]

## 2021-10-04 ENCOUNTER — Telehealth (INDEPENDENT_AMBULATORY_CARE_PROVIDER_SITE_OTHER): Payer: Self-pay

## 2021-10-04 NOTE — Telephone Encounter (Signed)
Patient returned my call and is scheduled for a right arm fistulagram on 10/24/21 with Dr. Lucky Cowboy. Pre-procedure instructions were discussed and will be mailed. Patient was offered 10/12/21 and 10/19/21 as well.

## 2021-10-16 ENCOUNTER — Emergency Department: Payer: Medicare Other

## 2021-10-16 ENCOUNTER — Other Ambulatory Visit: Payer: Self-pay

## 2021-10-16 ENCOUNTER — Encounter: Payer: Self-pay | Admitting: Emergency Medicine

## 2021-10-16 ENCOUNTER — Inpatient Hospital Stay
Admission: EM | Admit: 2021-10-16 | Discharge: 2021-10-20 | DRG: 177 | Disposition: A | Discharge status: Home / Self Care | Payer: Medicare Other | Attending: Internal Medicine | Admitting: Internal Medicine

## 2021-10-16 DIAGNOSIS — N2581 Secondary hyperparathyroidism of renal origin: Secondary | ICD-10-CM | POA: Diagnosis present

## 2021-10-16 DIAGNOSIS — E785 Hyperlipidemia, unspecified: Secondary | ICD-10-CM

## 2021-10-16 DIAGNOSIS — Z7982 Long term (current) use of aspirin: Secondary | ICD-10-CM

## 2021-10-16 DIAGNOSIS — R5381 Other malaise: Secondary | ICD-10-CM | POA: Diagnosis present

## 2021-10-16 DIAGNOSIS — U071 COVID-19: Secondary | ICD-10-CM | POA: Diagnosis present

## 2021-10-16 DIAGNOSIS — Z992 Dependence on renal dialysis: Secondary | ICD-10-CM | POA: Diagnosis not present

## 2021-10-16 DIAGNOSIS — Z7983 Long term (current) use of bisphosphonates: Secondary | ICD-10-CM | POA: Diagnosis not present

## 2021-10-16 DIAGNOSIS — Z66 Do not resuscitate: Secondary | ICD-10-CM | POA: Diagnosis present

## 2021-10-16 DIAGNOSIS — M109 Gout, unspecified: Secondary | ICD-10-CM | POA: Diagnosis present

## 2021-10-16 DIAGNOSIS — I959 Hypotension, unspecified: Secondary | ICD-10-CM

## 2021-10-16 DIAGNOSIS — I120 Hypertensive chronic kidney disease with stage 5 chronic kidney disease or end stage renal disease: Secondary | ICD-10-CM | POA: Diagnosis present

## 2021-10-16 DIAGNOSIS — J1282 Pneumonia due to coronavirus disease 2019: Secondary | ICD-10-CM

## 2021-10-16 DIAGNOSIS — R0602 Shortness of breath: Secondary | ICD-10-CM | POA: Diagnosis present

## 2021-10-16 DIAGNOSIS — N186 End stage renal disease: Secondary | ICD-10-CM

## 2021-10-16 DIAGNOSIS — N814 Uterovaginal prolapse, unspecified: Secondary | ICD-10-CM

## 2021-10-16 DIAGNOSIS — E1122 Type 2 diabetes mellitus with diabetic chronic kidney disease: Secondary | ICD-10-CM | POA: Diagnosis present

## 2021-10-16 DIAGNOSIS — D631 Anemia in chronic kidney disease: Secondary | ICD-10-CM | POA: Diagnosis present

## 2021-10-16 DIAGNOSIS — Z803 Family history of malignant neoplasm of breast: Secondary | ICD-10-CM

## 2021-10-16 DIAGNOSIS — Z79899 Other long term (current) drug therapy: Secondary | ICD-10-CM | POA: Diagnosis not present

## 2021-10-16 DIAGNOSIS — D7589 Other specified diseases of blood and blood-forming organs: Secondary | ICD-10-CM | POA: Diagnosis present

## 2021-10-16 DIAGNOSIS — Z87891 Personal history of nicotine dependence: Secondary | ICD-10-CM

## 2021-10-16 DIAGNOSIS — D696 Thrombocytopenia, unspecified: Secondary | ICD-10-CM

## 2021-10-16 DIAGNOSIS — I10 Essential (primary) hypertension: Secondary | ICD-10-CM

## 2021-10-16 DIAGNOSIS — Z7984 Long term (current) use of oral hypoglycemic drugs: Secondary | ICD-10-CM | POA: Diagnosis not present

## 2021-10-16 DIAGNOSIS — D6959 Other secondary thrombocytopenia: Secondary | ICD-10-CM | POA: Diagnosis present

## 2021-10-16 LAB — RESP PANEL BY RT-PCR (FLU A&B, COVID) ARPGX2
Influenza A by PCR: NEGATIVE
Influenza B by PCR: NEGATIVE
SARS Coronavirus 2 by RT PCR: POSITIVE — AB

## 2021-10-16 LAB — CBC
HCT: 37 % (ref 36.0–46.0)
Hemoglobin: 10.7 g/dL — ABNORMAL LOW (ref 12.0–15.0)
MCH: 30.8 pg (ref 26.0–34.0)
MCHC: 28.9 g/dL — ABNORMAL LOW (ref 30.0–36.0)
MCV: 106.6 fL — ABNORMAL HIGH (ref 80.0–100.0)
Platelets: 98 10*3/uL — ABNORMAL LOW (ref 150–400)
RBC: 3.47 MIL/uL — ABNORMAL LOW (ref 3.87–5.11)
RDW: 13.6 % (ref 11.5–15.5)
WBC: 6.7 10*3/uL (ref 4.0–10.5)
nRBC: 0 % (ref 0.0–0.2)

## 2021-10-16 LAB — COMPREHENSIVE METABOLIC PANEL
ALT: 18 U/L (ref 0–44)
AST: 44 U/L — ABNORMAL HIGH (ref 15–41)
Albumin: 2.8 g/dL — ABNORMAL LOW (ref 3.5–5.0)
Alkaline Phosphatase: 67 U/L (ref 38–126)
Anion gap: 8 (ref 5–15)
BUN: 25 mg/dL — ABNORMAL HIGH (ref 8–23)
CO2: 23 mmol/L (ref 22–32)
Calcium: 8.1 mg/dL — ABNORMAL LOW (ref 8.9–10.3)
Chloride: 105 mmol/L (ref 98–111)
Creatinine, Ser: 5.27 mg/dL — ABNORMAL HIGH (ref 0.44–1.00)
GFR, Estimated: 8 mL/min — ABNORMAL LOW (ref >60)
Glucose, Bld: 88 mg/dL (ref 70–99)
Potassium: 4.4 mmol/L (ref 3.5–5.1)
Sodium: 136 mmol/L (ref 135–145)
Total Bilirubin: 0.8 mg/dL (ref 0.3–1.2)
Total Protein: 6.7 g/dL (ref 6.5–8.1)

## 2021-10-16 LAB — TROPONIN I (HIGH SENSITIVITY)
Troponin I (High Sensitivity): 20 ng/L — ABNORMAL HIGH (ref <18)
Troponin I (High Sensitivity): 33 ng/L — ABNORMAL HIGH (ref <18)

## 2021-10-16 LAB — BRAIN NATRIURETIC PEPTIDE: B Natriuretic Peptide: 92.9 pg/mL (ref 0.0–100.0)

## 2021-10-16 MED ORDER — HYDROCOD POLST-CPM POLST ER 10-8 MG/5ML PO SUER: 5.0000 mL | Freq: Two times a day (BID) | ORAL | Status: DC | PRN

## 2021-10-16 MED ORDER — SODIUM CHLORIDE 0.9 % IV SOLN
100.0000 mg | Freq: Every day | INTRAVENOUS | Status: AC
  Administered 2021-10-17 – 2021-10-20 (×4): 100 mg via INTRAVENOUS
  Filled 2021-10-16: qty 20
  Filled 2021-10-16: qty 100
  Filled 2021-10-16 (×3): qty 20

## 2021-10-16 MED ORDER — ALENDRONATE SODIUM 70 MG PO TABS: 70.0000 mg | ORAL_TABLET | ORAL | Status: DC

## 2021-10-16 MED ORDER — ATORVASTATIN CALCIUM 20 MG PO TABS
40.0000 mg | ORAL_TABLET | Freq: Every day | ORAL | Status: DC
  Administered 2021-10-18 – 2021-10-19 (×3): 40 mg via ORAL
  Filled 2021-10-16 (×3): qty 2

## 2021-10-16 MED ORDER — TORSEMIDE 10 MG PO TABS
10.0000 mg | ORAL_TABLET | Freq: Every day | ORAL | Status: DC
  Administered 2021-10-18 – 2021-10-20 (×3): 10 mg via ORAL
  Filled 2021-10-16 (×3): qty 1

## 2021-10-16 MED ORDER — GUAIFENESIN-DM 100-10 MG/5ML PO SYRP: 10.0000 mL | ORAL_SOLUTION | ORAL | Status: DC | PRN

## 2021-10-16 MED ORDER — SODIUM BICARBONATE 650 MG PO TABS
1300.0000 mg | ORAL_TABLET | Freq: Two times a day (BID) | ORAL | Status: DC
  Administered 2021-10-17 – 2021-10-20 (×7): 1300 mg via ORAL
  Filled 2021-10-16 (×8): qty 2

## 2021-10-16 MED ORDER — LINAGLIPTIN 5 MG PO TABS: 5.0000 mg | ORAL_TABLET | Freq: Every day | ORAL | Status: DC

## 2021-10-16 MED ORDER — METHYLPREDNISOLONE SODIUM SUCC 125 MG IJ SOLR
1.0000 mg/kg | Freq: Two times a day (BID) | INTRAMUSCULAR | Status: AC
  Administered 2021-10-17 – 2021-10-19 (×6): 62.5 mg via INTRAVENOUS
  Filled 2021-10-16 (×6): qty 2

## 2021-10-16 MED ORDER — ACETAMINOPHEN 325 MG PO TABS: 650.0000 mg | ORAL_TABLET | Freq: Four times a day (QID) | ORAL | Status: DC | PRN

## 2021-10-16 MED ORDER — ONDANSETRON HCL 4 MG PO TABS: 4.0000 mg | ORAL_TABLET | Freq: Four times a day (QID) | ORAL | Status: DC | PRN

## 2021-10-16 MED ORDER — MAGNESIUM HYDROXIDE 400 MG/5ML PO SUSP: 30.0000 mL | Freq: Every day | ORAL | Status: DC | PRN

## 2021-10-16 MED ORDER — SODIUM CHLORIDE 0.9 % IV SOLN: 100.0000 mg | Freq: Every day | INTRAVENOUS | Status: DC

## 2021-10-16 MED ORDER — ALBUTEROL SULFATE HFA 108 (90 BASE) MCG/ACT IN AERS
2.0000 | INHALATION_SPRAY | Freq: Four times a day (QID) | RESPIRATORY_TRACT | Status: DC | PRN
  Filled 2021-10-16: qty 6.7

## 2021-10-16 MED ORDER — QUINAPRIL HCL 10 MG PO TABS: 40.0000 mg | ORAL_TABLET | Freq: Every day | ORAL | Status: DC

## 2021-10-16 MED ORDER — SODIUM CHLORIDE 0.9 % IV SOLN: 200.0000 mg | Freq: Once | INTRAVENOUS | Status: DC

## 2021-10-16 MED ORDER — COLCHICINE 0.6 MG PO TABS
0.6000 mg | ORAL_TABLET | ORAL | Status: DC
  Administered 2021-10-18 – 2021-10-20 (×2): 0.6 mg via ORAL
  Filled 2021-10-16 (×4): qty 1

## 2021-10-16 MED ORDER — FERROUS SULFATE 325 (65 FE) MG PO TABS
325.0000 mg | ORAL_TABLET | Freq: Every day | ORAL | Status: DC
  Administered 2021-10-17 – 2021-10-20 (×4): 325 mg via ORAL
  Filled 2021-10-16 (×4): qty 1

## 2021-10-16 MED ORDER — ASCORBIC ACID 500 MG PO TABS
500.0000 mg | ORAL_TABLET | Freq: Every day | ORAL | Status: DC
  Administered 2021-10-17 – 2021-10-20 (×4): 500 mg via ORAL
  Filled 2021-10-16 (×4): qty 1

## 2021-10-16 MED ORDER — LACTATED RINGERS IV BOLUS
500.0000 mL | Freq: Once | INTRAVENOUS | Status: AC
  Administered 2021-10-17: 500 mL via INTRAVENOUS

## 2021-10-16 MED ORDER — TRAZODONE HCL 50 MG PO TABS: 25.0000 mg | ORAL_TABLET | Freq: Every evening | ORAL | Status: DC | PRN

## 2021-10-16 MED ORDER — VITAMIN D 25 MCG (1000 UNIT) PO TABS
2000.0000 [IU] | ORAL_TABLET | Freq: Every day | ORAL | Status: DC
  Administered 2021-10-17 – 2021-10-20 (×4): 2000 [IU] via ORAL
  Filled 2021-10-16 (×4): qty 2

## 2021-10-16 MED ORDER — PREDNISONE 20 MG PO TABS
50.0000 mg | ORAL_TABLET | Freq: Every day | ORAL | Status: DC
  Administered 2021-10-20: 50 mg via ORAL
  Filled 2021-10-16: qty 3

## 2021-10-16 MED ORDER — SODIUM CHLORIDE 0.9 % IV SOLN
200.0000 mg | Freq: Once | INTRAVENOUS | Status: AC
  Administered 2021-10-16: 200 mg via INTRAVENOUS
  Filled 2021-10-16: qty 40

## 2021-10-16 MED ORDER — ONDANSETRON HCL 4 MG/2ML IJ SOLN: 4.0000 mg | Freq: Four times a day (QID) | INTRAMUSCULAR | Status: DC | PRN

## 2021-10-16 MED ORDER — AMLODIPINE BESYLATE 5 MG PO TABS: 5.0000 mg | ORAL_TABLET | Freq: Every day | ORAL | Status: DC

## 2021-10-16 MED ORDER — CALCITONIN (SALMON) 200 UNIT/ACT NA SOLN
1.0000 | Freq: Every day | NASAL | Status: DC
  Administered 2021-10-19: 1 via NASAL
  Filled 2021-10-16: qty 3.7

## 2021-10-16 MED ORDER — METHYLPREDNISOLONE SODIUM SUCC 125 MG IJ SOLR
125.0000 mg | Freq: Once | INTRAMUSCULAR | Status: AC
  Administered 2021-10-16: 125 mg via INTRAVENOUS
  Filled 2021-10-16: qty 2

## 2021-10-16 MED ORDER — HYDROCODONE-ACETAMINOPHEN 5-325 MG PO TABS
1.0000 | ORAL_TABLET | Freq: Four times a day (QID) | ORAL | Status: DC | PRN
  Administered 2021-10-18: 1 via ORAL
  Filled 2021-10-16: qty 1

## 2021-10-16 MED ORDER — ZINC SULFATE 220 (50 ZN) MG PO CAPS
220.0000 mg | ORAL_CAPSULE | Freq: Every day | ORAL | Status: DC
  Administered 2021-10-17 – 2021-10-20 (×4): 220 mg via ORAL
  Filled 2021-10-16 (×4): qty 1

## 2021-10-16 MED ORDER — GLIPIZIDE ER 5 MG PO TB24: 5.0000 mg | ORAL_TABLET | Freq: Every day | ORAL | Status: DC

## 2021-10-16 MED ORDER — HEPARIN SODIUM (PORCINE) 5000 UNIT/ML IJ SOLN
5000.0000 [IU] | Freq: Three times a day (TID) | INTRAMUSCULAR | Status: DC
  Administered 2021-10-17 – 2021-10-20 (×11): 5000 [IU] via SUBCUTANEOUS
  Filled 2021-10-16 (×11): qty 1

## 2021-10-16 MED ORDER — ADULT MULTIVITAMIN W/MINERALS CH
1.0000 | ORAL_TABLET | Freq: Every day | ORAL | Status: DC
  Administered 2021-10-17: 1 via ORAL
  Filled 2021-10-16: qty 1

## 2021-10-16 MED ORDER — FAMOTIDINE 20 MG PO TABS
10.0000 mg | ORAL_TABLET | Freq: Two times a day (BID) | ORAL | Status: DC
  Administered 2021-10-17 – 2021-10-20 (×7): 10 mg via ORAL
  Filled 2021-10-16 (×7): qty 1

## 2021-10-16 MED ORDER — ASPIRIN EC 81 MG PO TBEC
81.0000 mg | DELAYED_RELEASE_TABLET | Freq: Every day | ORAL | Status: DC
  Administered 2021-10-17 – 2021-10-19 (×4): 81 mg via ORAL
  Filled 2021-10-16 (×4): qty 1

## 2021-10-16 NOTE — Progress Notes (Signed)
Remdesivir - Pharmacy Brief Note   O:  CXR: "Diffuse interstitial prominence noted. This is greatest at the lung bases. No significant airspace consolidation is present." SpO2: 91-100% on RA   A/P:  Remdesivir 200 mg IVPB once followed by 100 mg IVPB daily x 4 days.   Renda Rolls, PharmD, [facility] 10/16/2021 11:07 PM

## 2021-10-16 NOTE — ED Triage Notes (Signed)
Pt reports decrease in appetite for the last 3 days and weakness. Pt denies pain, cough, congestion or other sx's but is requesting a covid test as well. Pt is a dialysis pt as well.

## 2021-10-16 NOTE — ED Provider Notes (Signed)
[facility] Emergency Department Provider Note  ____________________________________________  Time seen: Approximately 11:06 PM  I have reviewed the triage vital signs and the nursing notes.   HISTORY  Chief Complaint Weakness   HPI Krista Gordon is a 81 y.o. female history of ESRD on HD, diabetes, hypertension, hyperlipidemia who presents for evaluation of generalized weakness.  Patient reports 3 days of generalized weakness, productive cough, fatigue.  No body aches, no chills or fever, no chest pain or shortness of breath, no abdominal pain, no vomiting or diarrhea.  Last dialysis was yesterday.  Symptoms have been getting progressively worse and currently moderate in intensity.  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[place]) 06/21/2018   Nephrolithiasis 06/20/2018   Chronic kidney disease, stage III (moderate) ([place]) 06/20/2018   Diabetes ([place]) 06/20/2018   Essential hypertension 06/20/2018   Hematuria 06/20/2018   HLD (hyperlipidemia) 06/20/2018   Proteinuria 06/20/2018    Past Surgical History:  Procedure Laterality Date   A/V SHUNTOGRAM Left 02/16/2020   Procedure: A/V SHUNTOGRAM;  Surgeon: Algernon Huxley, MD;  Location: [place] CV LAB;  Service: Cardiovascular;  Laterality: Left;   AV FISTULA PLACEMENT Left 01/15/2020   Procedure: INSERTION OF ARTERIOVENOUS (AV) GORE-TEX GRAFT ARM;  Surgeon: Algernon Huxley, MD;  Location: [facility] ORS;  Service: Vascular;  Laterality: Left;   COLONOSCOPY WITH PROPOFOL N/A 01/27/2016   Procedure: COLONOSCOPY WITH PROPOFOL;  Surgeon: Hulen Luster, MD;  Location: [facility] ENDOSCOPY;  Service: Gastroenterology;  Laterality: N/A;    TUBAL LIGATION      Prior to Admission medications   Medication Sig Start Date End Date Taking? Authorizing Provider  alendronate (FOSAMAX) 70 MG tablet Take 70 mg by mouth every Monday. Take with a full glass of water on an empty stomach.     [provider]  amLODipine (NORVASC) 5 MG tablet Take 5 mg by mouth at bedtime.     [provider]  aspirin 81 MG EC tablet Take 81 mg by mouth at bedtime. Swallow whole.     [provider]  atorvastatin (LIPITOR) 40 MG tablet Take 40 mg by mouth at bedtime.     [provider]  calcitonin, salmon, (MIACALCIN/FORTICAL) 200 UNIT/ACT nasal spray Place 1 spray into alternate nostrils at bedtime.    [provider]  Cholecalciferol (VITAMIN D) 2000 units tablet Take 2,000 Units by mouth daily.    [provider]  colchicine 0.6 MG tablet Take 0.6 mg by mouth at bedtime.     [provider]  ferrous sulfate 325 (65 FE) MG tablet Take 325 mg by mouth daily.     [provider]  glipiZIDE (GLUCOTROL XL) 5 MG 24 hr tablet Take 5 mg by mouth at bedtime.    [provider]  HYDROcodone-acetaminophen (NORCO) 5-325 MG tablet Take 1 tablet by mouth every 6 (six) hours as needed for moderate pain. 01/15/20   Algernon Huxley, MD  linagliptin (TRADJENTA) 5 MG TABS tablet Take 5 mg by mouth at bedtime.     [provider]  Multiple Vitamin (MULTIVITAMIN WITH MINERALS)  TABS tablet Take 1 tablet by mouth at bedtime.    [provider]  PROAIR HFA 108 [phone] Base) MCG/ACT inhaler Inhale 2 puffs into the lungs every 6 (six) hours as needed for shortness of breath. 06/15/18   [provider]  quinapril (ACCUPRIL) 40 MG tablet Take 40 mg by mouth at bedtime.    [provider]  sodium bicarbonate 650 MG tablet Take 650 mg by mouth daily.  04/01/18   [provider]  torsemide (DEMADEX) 10 MG tablet Take 10 mg by mouth daily.    [provider]     Allergies Patient has no known allergies.  Family History  Problem Relation Age of Onset   Breast cancer Sister 14    Social History Social History   Tobacco Use   Smoking status: Former   Smokeless tobacco: Never  Scientific laboratory technician Use: Never used  Substance Use Topics   Alcohol use: No   Drug use: No    Review of Systems  Constitutional: Negative for fever. + Fatigue and generalized weakness Eyes: Negative for visual changes. ENT: Negative for sore throat. Neck: No neck pain  Cardiovascular: Negative for chest pain. Respiratory: Negative for shortness of breath. + Productive cough Gastrointestinal: Negative for abdominal pain, vomiting or diarrhea. Genitourinary: Negative for dysuria. Musculoskeletal: Negative for back pain. Skin: Negative for rash. Neurological: Negative for headaches, weakness or numbness. Psych: No SI or HI  ____________________________________________   PHYSICAL EXAM:  VITAL SIGNS: ED Triage Vitals  Enc Vitals Group     BP 10/16/21 1420 (!) 98/56     Pulse Rate 10/16/21 1420 81     Resp 10/16/21 1420 (!) 28     Temp 10/16/21 1420 99.3 F (37.4 C)     Temp Source 10/16/21 1420 Oral     SpO2 10/16/21 1420 93 %     Weight 10/16/21 1413 138 lb 14.2 oz (63 kg)     Height 10/16/21 1413 5\' 2"  (1.575 m)     Head Circumference --      Peak Flow --      Pain Score 10/16/21 1413 0     Pain Loc --      Pain Edu? --      Excl. in [place]? --     Constitutional: Alert and oriented. Well appearing and in no apparent distress. HEENT:      Head: Normocephalic and atraumatic.         Eyes: Conjunctivae are normal. Sclera is non-icteric.       Mouth/Throat: Mucous membranes are moist.       Neck: Supple with no signs of meningismus. Cardiovascular: Regular rate and rhythm. No murmurs, gallops, or rubs. 2+ symmetrical distal pulses are present in all extremities. No JVD. Respiratory: Tachypneic with respiratory rate in the upper 20s, no  hypoxia at rest but patient does drop down to the lower 90s with minimal exertion, lungs are clear to auscultation Gastrointestinal: Soft, non tender, and non distended with positive bowel sounds. No rebound or guarding. Genitourinary: No CVA tenderness. Musculoskeletal:  No edema, cyanosis, or erythema of extremities. Neurologic: Normal speech and language. Face is symmetric. Moving all extremities. No gross focal neurologic deficits are appreciated. Skin: Skin is warm, dry and intact. No rash noted. Psychiatric: Mood and affect are normal. Speech and behavior are normal.  ____________________________________________   LABS (all labs ordered are listed, but only abnormal results are displayed)  Labs Reviewed  RESP PANEL BY RT-PCR (  FLU A&B, COVID) ARPGX2 - Abnormal; Notable for the following components:      Result Value   SARS Coronavirus 2 by RT PCR POSITIVE (*)    All other components within normal limits  CBC - Abnormal; Notable for the following components:   RBC 3.47 (*)    Hemoglobin 10.7 (*)    MCV 106.6 (*)    MCHC 28.9 (*)    Platelets 98 (*)    All other components within normal limits  COMPREHENSIVE METABOLIC PANEL - Abnormal; Notable for the following components:   BUN 25 (*)    Creatinine, Ser 5.27 (*)    Calcium 8.1 (*)    Albumin 2.8 (*)    AST 44 (*)    GFR, Estimated 8 (*)    All other components within normal limits  TROPONIN I (HIGH SENSITIVITY) - Abnormal; Notable for the following components:   Troponin I (High Sensitivity) 20 (*)    All other components within normal limits  TROPONIN I (HIGH SENSITIVITY) - Abnormal; Notable for the following components:   Troponin I (High Sensitivity) 33 (*)    All other components within normal limits  BRAIN NATRIURETIC PEPTIDE   ____________________________________________  EKG  ED ECG REPORT I, Rudene Re, the attending physician, personally viewed and interpreted this ECG.  Sinus rhythm with a rate of  79, normal intervals, normal axis, no ST elevations or depressions. ____________________________________________  RADIOLOGY  I have personally reviewed the images performed during this visit and I agree with the Radiologist's read.   Interpretation by Radiologist:  DG Chest 2 View  Result Date: 10/16/2021 CLINICAL DATA:  COVID. Decreased appetite for 3 days. Weakness. Dialysis patient. EXAM: CHEST - 2 VIEW COMPARISON:  None. FINDINGS: The heart is enlarged. Diffuse interstitial prominence noted. This is greatest at the lung bases. No significant airspace consolidation is present. Fused anterior osteophytes are present within the thoracic spine. IMPRESSION: 1. Cardiomegaly with diffuse interstitial prominence. While this may represent edema, viral infection is also considered. Electronically Signed   By: San Morelle M.D.   On: 10/16/2021 20:24     ____________________________________________   PROCEDURES  Procedure(s) performed:yes .1-3 Lead EKG Interpretation Performed by: Rudene Re, MD Authorized by: Rudene Re, MD     Interpretation: non-specific     ECG rate assessment: normal     Rhythm: sinus rhythm     Ectopy: none     Conduction: normal     Critical Care performed: yes  CRITICAL CARE Performed by: Rudene Re  ?  Total critical care time: 35 min  Critical care time was exclusive of separately billable procedures and treating other patients.  Critical care was necessary to treat or prevent imminent or life-threatening deterioration.  Critical care was time spent personally by me on the following activities: development of treatment plan with patient and/or surrogate as well as nursing, discussions with consultants, evaluation of patient's response to treatment, examination of patient, obtaining history from patient or surrogate, ordering and performing treatments and interventions, ordering and review of laboratory studies, ordering and  review of radiographic studies, pulse oximetry and re-evaluation of patient's condition.  ____________________________________________   INITIAL IMPRESSION / ASSESSMENT AND PLAN / ED COURSE  81 y.o. female history of ESRD on HD, diabetes, hypertension, hyperlipidemia who presents for evaluation of generalized weakness, fatigue, productive cough x 3 days. Last HD yesterday.  Patient is slightly tachypneic with respiratory rate in the mid 20s, not hypoxic at rest but does drop down to 91%  with exertion.  Lungs are clear to auscultation.  Slightly hypotensive with initial BP in the 90s, has improved slightly without any intervention but still in the low 100s.  Patient's baseline per review of EMR shows that her BP is usually in the 140s.  Chest x-ray showing early findings of COVID-pneumonia.  Troponin slightly positive and trending up and new thrombocytopenia in the setting of COVID positive test.  We will start remdesivir, Solu-Medrol, will gently give IV fluid for hypotension and discussed with the hospitalist for admission.  At this time patient denies any chest pain or shortness of breath therefore we will hold off any imaging of the chest to rule out PE.  These findings are most likely due to [place].  Old medical records reviewed.  Patient placed on telemetry for close monitoring of cardiorespiratory status     _____________________________________________ Please note:  Patient was evaluated in Emergency Department today for the symptoms described in the history of present illness. Patient was evaluated in the context of the global COVID-19 pandemic, which necessitated consideration that the patient might be at risk for infection with the SARS-CoV-2 virus that causes COVID-19. Institutional protocols and algorithms that pertain to the evaluation of patients at risk for COVID-19 are in a state of rapid change based on information released by regulatory bodies including the CDC and federal and state  organizations. These policies and algorithms were followed during the patient's care in the ED.  Some ED evaluations and interventions may be delayed as a result of limited staffing during the pandemic.   [place] Controlled Substance Database was reviewed by me. ____________________________________________   FINAL CLINICAL IMPRESSION(S) / ED DIAGNOSES   Final diagnoses:  COVID-19  Hypotension, unspecified hypotension type  Thrombocytopenia ([place])      NEW MEDICATIONS STARTED DURING THIS VISIT:  ED Discharge Orders     None        Note:  This document was prepared using Dragon voice recognition software and may include unintentional dictation errors.    Alfred Levins, [place], MD 10/16/21 [phone]

## 2021-10-17 DIAGNOSIS — D696 Thrombocytopenia, unspecified: Secondary | ICD-10-CM

## 2021-10-17 LAB — EXPECTORATED SPUTUM ASSESSMENT W GRAM STAIN, RFLX TO RESP C

## 2021-10-17 LAB — CBC
HCT: 34.6 % — ABNORMAL LOW (ref 36.0–46.0)
Hemoglobin: 10.2 g/dL — ABNORMAL LOW (ref 12.0–15.0)
MCH: 31.6 pg (ref 26.0–34.0)
MCHC: 29.5 g/dL — ABNORMAL LOW (ref 30.0–36.0)
MCV: 107.1 fL — ABNORMAL HIGH (ref 80.0–100.0)
Platelets: 88 10*3/uL — ABNORMAL LOW (ref 150–400)
RBC: 3.23 MIL/uL — ABNORMAL LOW (ref 3.87–5.11)
RDW: 13.6 % (ref 11.5–15.5)
WBC: 5.1 10*3/uL (ref 4.0–10.5)
nRBC: 0 % (ref 0.0–0.2)

## 2021-10-17 LAB — CBC WITH DIFFERENTIAL/PLATELET
Abs Immature Granulocytes: 0.02 10*3/uL (ref 0.00–0.07)
Basophils Absolute: 0 10*3/uL (ref 0.0–0.1)
Basophils Relative: 0 %
Eosinophils Absolute: 0 10*3/uL (ref 0.0–0.5)
Eosinophils Relative: 0 %
HCT: 34.8 % — ABNORMAL LOW (ref 36.0–46.0)
Hemoglobin: 10.1 g/dL — ABNORMAL LOW (ref 12.0–15.0)
Immature Granulocytes: 0 %
Lymphocytes Relative: 12 %
Lymphs Abs: 0.5 10*3/uL — ABNORMAL LOW (ref 0.7–4.0)
MCH: 31 pg (ref 26.0–34.0)
MCHC: 29 g/dL — ABNORMAL LOW (ref 30.0–36.0)
MCV: 106.7 fL — ABNORMAL HIGH (ref 80.0–100.0)
Monocytes Absolute: 0.1 10*3/uL (ref 0.1–1.0)
Monocytes Relative: 2 %
Neutro Abs: 3.9 10*3/uL (ref 1.7–7.7)
Neutrophils Relative %: 86 %
Platelets: 90 10*3/uL — ABNORMAL LOW (ref 150–400)
RBC: 3.26 MIL/uL — ABNORMAL LOW (ref 3.87–5.11)
RDW: 13.5 % (ref 11.5–15.5)
WBC: 4.5 10*3/uL (ref 4.0–10.5)
nRBC: 0 % (ref 0.0–0.2)

## 2021-10-17 LAB — COMPREHENSIVE METABOLIC PANEL
ALT: 16 U/L (ref 0–44)
AST: 40 U/L (ref 15–41)
Albumin: 2.7 g/dL — ABNORMAL LOW (ref 3.5–5.0)
Alkaline Phosphatase: 67 U/L (ref 38–126)
Anion gap: 8 (ref 5–15)
BUN: 37 mg/dL — ABNORMAL HIGH (ref 8–23)
CO2: 24 mmol/L (ref 22–32)
Calcium: 8.1 mg/dL — ABNORMAL LOW (ref 8.9–10.3)
Chloride: 106 mmol/L (ref 98–111)
Creatinine, Ser: 6.11 mg/dL — ABNORMAL HIGH (ref 0.44–1.00)
GFR, Estimated: 7 mL/min — ABNORMAL LOW (ref >60)
Glucose, Bld: 96 mg/dL (ref 70–99)
Potassium: 5.1 mmol/L (ref 3.5–5.1)
Sodium: 138 mmol/L (ref 135–145)
Total Bilirubin: 0.9 mg/dL (ref 0.3–1.2)
Total Protein: 6.3 g/dL — ABNORMAL LOW (ref 6.5–8.1)

## 2021-10-17 LAB — BRAIN NATRIURETIC PEPTIDE: B Natriuretic Peptide: 64.8 pg/mL (ref 0.0–100.0)

## 2021-10-17 LAB — FERRITIN
Ferritin: 2164 ng/mL — ABNORMAL HIGH (ref 11–307)
Ferritin: 2247 ng/mL — ABNORMAL HIGH (ref 11–307)

## 2021-10-17 LAB — TROPONIN I (HIGH SENSITIVITY)
Troponin I (High Sensitivity): 17 ng/L (ref <18)
Troponin I (High Sensitivity): 17 ng/L (ref <18)

## 2021-10-17 LAB — C-REACTIVE PROTEIN
CRP: 8.2 mg/dL — ABNORMAL HIGH (ref <1.0)
CRP: 8.4 mg/dL — ABNORMAL HIGH (ref <1.0)

## 2021-10-17 LAB — FIBRINOGEN: Fibrinogen: 461 mg/dL (ref 210–475)

## 2021-10-17 LAB — PROCALCITONIN: Procalcitonin: 0.74 ng/mL

## 2021-10-17 LAB — CBG MONITORING, ED
Glucose-Capillary: 146 mg/dL — ABNORMAL HIGH (ref 70–99)
Glucose-Capillary: 91 mg/dL (ref 70–99)

## 2021-10-17 LAB — D-DIMER, QUANTITATIVE
D-Dimer, Quant: 1.72 ug/mL-FEU — ABNORMAL HIGH (ref 0.00–0.50)
D-Dimer, Quant: 1.74 ug/mL-FEU — ABNORMAL HIGH (ref 0.00–0.50)

## 2021-10-17 LAB — LACTATE DEHYDROGENASE: LDH: 192 U/L (ref 98–192)

## 2021-10-17 LAB — CREATININE, SERUM
Creatinine, Ser: 5.98 mg/dL — ABNORMAL HIGH (ref 0.44–1.00)
GFR, Estimated: 7 mL/min — ABNORMAL LOW (ref >60)

## 2021-10-17 MED ORDER — INSULIN ASPART 100 UNIT/ML IJ SOLN: 0.0000 [IU] | Freq: Every day | INTRAMUSCULAR | Status: DC

## 2021-10-17 MED ORDER — INSULIN ASPART 100 UNIT/ML IJ SOLN
0.0000 [IU] | Freq: Three times a day (TID) | INTRAMUSCULAR | Status: DC
  Administered 2021-10-18: 1 [IU] via SUBCUTANEOUS
  Administered 2021-10-19: 2 [IU] via SUBCUTANEOUS
  Administered 2021-10-19: 3 [IU] via SUBCUTANEOUS
  Administered 2021-10-20: 2 [IU] via SUBCUTANEOUS
  Filled 2021-10-17 (×4): qty 1

## 2021-10-17 MED ORDER — INSULIN ASPART 100 UNIT/ML IJ SOLN: 0.0000 [IU] | Freq: Three times a day (TID) | INTRAMUSCULAR | Status: DC

## 2021-10-17 NOTE — ED Notes (Signed)
Pt given meal tray.

## 2021-10-17 NOTE — ED Notes (Signed)
Pt given sprite at this time by other RN

## 2021-10-17 NOTE — ED Notes (Signed)
Pt assisted off of bedpan. Pt cleaned and new purewick in place. Pt denies any further needs at this time

## 2021-10-17 NOTE — ED Notes (Signed)
Report received from [place], RN

## 2021-10-17 NOTE — ED Notes (Signed)
Pt's brief changed and pt repositioned in the bed with assistance from [place], EDT. Purewick repositioned and both trash cans emptied. All clothing and other belongings placed in a bag labeled with pt's name. No further needs at this time.

## 2021-10-17 NOTE — Progress Notes (Signed)
PHARMACIST - PHYSICIAN COMMUNICATION  CONCERNING: P&T Medication Policy Regarding Oral Bisphosphonates  RECOMMENDATION: Your order for alendronate (Fosamax), ibandronate (Boniva), or risedronate (Actonel) has been discontinued at this time.  If the patients post-hospital medical condition warrants safe use of this class of drugs, please resume the pre-hospital regimen upon discharge.  DESCRIPTION:  Alendronate (Fosamax), ibandronate (Boniva), and risedronate (Actonel) can cause severe esophageal erosions in patients who are unable to remain upright at least 30 minutes after taking this medication.   Since brief interruptions in therapy are thought to have minimal impact on bone mineral density, the [place] has established that bisphosphonate orders should be routinely discontinued during hospitalization.   To override this safety policy and permit administration of Boniva, Fosamax, or Actonel in the hospital, prescribers must write DO NOT HOLD in the comments section when placing the order for this class of medications.  Renda Rolls, PharmD, [facility] 10/17/2021 12:57 AM

## 2021-10-17 NOTE — ED Notes (Signed)
Pt given breakfast tray

## 2021-10-17 NOTE — ED Notes (Signed)
Pt ambulated to restroom without difficulty with one staff assist. Pt returned safely to bed.

## 2021-10-17 NOTE — ED Notes (Signed)
Pt given warm blankets at this time 

## 2021-10-17 NOTE — ED Notes (Addendum)
RN at bedside to answer call bell. Pt stating food tray is cold and unable to eat it. Dietary called to resend meal tray

## 2021-10-17 NOTE — H&P (Signed)
[place]   PATIENT NAME: Krista Gordon    MR#:  [id number]  DATE OF BIRTH:  September 19, 1941  DATE OF ADMISSION:  10/16/2021  PRIMARY CARE PHYSICIAN: Marguerita Merles, MD   Patient is coming from: Home  REQUESTING/REFERRING PHYSICIAN: Rudene Re, MD  CHIEF COMPLAINT:   Chief Complaint  Patient presents with   Weakness    HISTORY OF PRESENT ILLNESS:  Krista Gordon is a 81 y.o. 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 intake.  She has been having cough productive of whitish sputum over the last 3 days without dyspnea or fever or chills.  She denies any nausea or vomiting or diarrhea.  No loss of taste or smell.  No chest pain or palpitations. . ED Course: When she came to the ER blood pressure was 98/56 and later 85/56 with respiratory to of 28 and later 18 and pulse currently was 93% and later 91% room air.  CMP was remarkable for BUN of 25 and creatinine 5.27 with albumin of 2.8 and total protein of 6.7.  High-sensitivity troponin I was 20 and later 33 and BNP was 92.9.  CBC showed anemia with hemoglobin of 10.7 but hematocrit of 37.  She has macrocytosis.  Platelets were low at 98.  Influenza antigens came back negative but COVID-19 PCR came back positive.   EKG as reviewed by me : EKG showed normal sinus rhythm with a rate of 79 with poor R wave progression and T wave inversion inferiorly Imaging: Chest x-ray showed cardiomegaly with diffuse interstitial prominence that is associated likely with atypical pneumonia.  The patient was given 500 mill IV lactated ringer, 125 mg of IV Solu-Medrol as well as IV remdesivir.  She will be admitted to a medical telemetry bed for further evaluation and management.   PAST  MEDICAL HISTORY:   Past Medical History:  Diagnosis Date   Anemia    Diabetes mellitus without complication ([place])    Gall stone    Gout    Hyperlipidemia    Hypertension    Renal disorder    kidney disease    PAST SURGICAL HISTORY:   Past Surgical History:  Procedure Laterality Date   A/V SHUNTOGRAM Left 02/16/2020   Procedure: A/V SHUNTOGRAM;  Surgeon: Algernon Huxley, MD;  Location: [facility];  Service: Cardiovascular;  Laterality: Left;   AV FISTULA PLACEMENT Left 01/15/2020   Procedure: INSERTION OF ARTERIOVENOUS (AV) GORE-TEX GRAFT ARM;  Surgeon: Algernon Huxley, MD;  Location: [facility];  Service: Vascular;  Laterality: Left;   COLONOSCOPY WITH PROPOFOL N/A 01/27/2016   Procedure: COLONOSCOPY WITH PROPOFOL;  Surgeon: Hulen Luster, MD;  Location: [facility];  Service: Gastroenterology;  Laterality: N/A;   TUBAL LIGATION      SOCIAL HISTORY:   Social History   Tobacco Use   Smoking status: Former   Smokeless tobacco: Never  Substance Use Topics   Alcohol use: No    FAMILY HISTORY:   Family History  Problem Relation Age of Onset   Breast cancer Sister 42    DRUG ALLERGIES:  No Known Allergies  REVIEW OF SYSTEMS:   ROS As per history of present illness. All pertinent systems were reviewed above. Constitutional, HEENT, cardiovascular, respiratory, GI, GU,  musculoskeletal, neuro, psychiatric, endocrine, integumentary and hematologic systems were reviewed and are otherwise negative/unremarkable except for positive findings mentioned above in the HPI.   MEDICATIONS AT HOME:   Prior to Admission medications   Medication Sig Start Date End Date Taking? Authorizing Provider  alendronate (FOSAMAX) 70 MG tablet Take 70 mg by mouth every Monday. Take with a full glass of water on an empty stomach.     [provider]  amLODipine (NORVASC) 5 MG tablet Take 5 mg by mouth at bedtime.     [provider]  aspirin 81 MG EC tablet Take 81 mg by mouth at  bedtime. Swallow whole.     [provider]  atorvastatin (LIPITOR) 40 MG tablet Take 40 mg by mouth at bedtime.     [provider]  calcitonin, salmon, (MIACALCIN/FORTICAL) 200 UNIT/ACT nasal spray Place 1 spray into alternate nostrils at bedtime.    [provider]  Cholecalciferol (VITAMIN D) 2000 units tablet Take 2,000 Units by mouth daily.    [provider]  colchicine 0.6 MG tablet Take 0.6 mg by mouth at bedtime.     [provider]  ferrous sulfate 325 (65 FE) MG tablet Take 325 mg by mouth daily.     [provider]  glipiZIDE (GLUCOTROL XL) 5 MG 24 hr tablet Take 5 mg by mouth at bedtime.    [provider]  HYDROcodone-acetaminophen (NORCO) 5-325 MG tablet Take 1 tablet by mouth every 6 (six) hours as needed for moderate pain. 01/15/20   Algernon Huxley, MD  linagliptin (TRADJENTA) 5 MG TABS tablet Take 5 mg by mouth at bedtime.     [provider]  Multiple Vitamin (MULTIVITAMIN WITH MINERALS) TABS tablet Take 1 tablet by mouth at bedtime.    [provider]  PROAIR HFA [phone] Base) MCG/ACT inhaler Inhale 2 puffs into the lungs every 6 (six) hours as needed for shortness of breath. 06/15/18   [provider]  quinapril (ACCUPRIL) 40 MG tablet Take 40 mg by mouth at bedtime.    [provider]  sodium bicarbonate 650 MG tablet Take 650 mg by mouth daily.  04/01/18   [provider]  torsemide (DEMADEX) 10 MG tablet Take 10 mg by mouth daily.    [provider]      VITAL SIGNS:  Blood pressure (!) 97/51, pulse 71, temperature 99.3 F (37.4 C), temperature source Oral, resp. rate (!) 23, height 5\' 2"  (1.575 m), weight 62.4 kg, SpO2 100 %.  PHYSICAL EXAMINATION:  Physical Exam  GENERAL:  81 y.o.-year-old African-American female patient lying in the bed with mild respiratory distress with conversational dyspnea. EYES: Pupils equal, round, reactive to light and  accommodation. No scleral icterus. Extraocular muscles intact.  HEENT: Head atraumatic, normocephalic. Oropharynx and nasopharynx clear.  NECK:  Supple, no jugular venous distention. No thyroid enlargement, no tenderness.  LUNGS: Slightly diminished bibasilar breath sounds with bibasal crackles.  No use of accessory muscles of respiration.  CARDIOVASCULAR: Regular rate and rhythm, S1, S2 normal. No murmurs, rubs, or gallops.  ABDOMEN: Soft, nondistended, nontender. Bowel sounds present. No organomegaly or mass.  EXTREMITIES: No pedal edema, cyanosis, or clubbing.  NEUROLOGIC: Cranial nerves II through XII are intact. Muscle strength 5/5 in all extremities. Sensation intact. Gait not checked.  PSYCHIATRIC: The patient is alert and oriented x 3.  Normal affect and good eye contact. SKIN: No obvious rash, lesion, or ulcer.   LABORATORY PANEL:   CBC  Recent Labs  Lab 10/16/21 1419  WBC 6.7  HGB 10.7*  HCT 37.0  PLT 98*   ------------------------------------------------------------------------------------------------------------------  Chemistries  Recent Labs  Lab 10/16/21 1419  NA 136  K 4.4  CL 105  CO2 23  GLUCOSE 88  BUN 25*  CREATININE 5.27*  CALCIUM 8.1*  AST 44*  ALT 18  ALKPHOS 67  BILITOT 0.8   ------------------------------------------------------------------------------------------------------------------  Cardiac Enzymes No results for input(s): TROPONINI in the last 168 hours. ------------------------------------------------------------------------------------------------------------------  RADIOLOGY:  DG Chest 2 View  Result Date: 10/16/2021 CLINICAL DATA:  COVID. Decreased appetite for 3 days. Weakness. Dialysis patient. EXAM: CHEST - 2 VIEW COMPARISON:  None. FINDINGS: The heart is enlarged. Diffuse interstitial prominence noted. This is greatest at the lung bases. No significant airspace consolidation is present. Fused anterior osteophytes are present  within the thoracic spine. IMPRESSION: 1. Cardiomegaly with diffuse interstitial prominence. While this may represent edema, viral infection is also considered. Electronically Signed   By: San Morelle M.D.   On: 10/16/2021 20:24      IMPRESSION AND PLAN:  Principal Problem:   Pneumonia due to COVID-19 virus    1.  Multifocal pneumonia secondary to COVID-19. -The patient will be admitted to an isolation monitored bed with droplet and contact precautions. -Given multifocal pneumonia we will empirically place the patient on IV Rocephin and Zithromax for possible bacterial superinfection only with elevated Procalcitonin. -The patient will be placed on scheduled Mucinex and as needed Tussionex. -We will avoid nebulization as much as we can, give bronchodilator MDI if needed, and with deterioration of oxygenation try to avoid BiPAP/CPAP if possible.    -Will obtain sputum Gram stain culture and sensitivity and follow blood cultures. -O2 protocol will be followed. -We will follow CRP, ferritin, LDH and D-dimer. -Will follow manual differential for ANC/ALC ratio as well as follow troponin I and daily CBC with manual differential and CMP. - Will place the patient on IV Remdesivir and IV steroid therapy with IV Solu-Medrol especially given dropping pulse oximetry on ambulation. -The patient will be placed on vitamin D3, vitamin C, zinc sulfate, p.o. Pepcid and aspirin.  2.  End-stage renal disease on hemodialysis. - Nephrology consult will be obtained. - I notified Dr. About the patient.  3.  Dyslipidemia. - We will continue statin therapy.  4.  Hypertension - We will continue her antihypertensives.  5.  Type 2 diabetes mellitus. - We will continue her antidiabetics and place her on supplement coverage with NovoLog.   DVT prophylaxis: Lovenox. Code Status: Discussed the CODE STATUS with the patient and her daughter.  The patient desires to be DNR/DNI and her daughter respects her  decision. Family Communication:  The plan of care was discussed in details with the patient (and family). I answered all questions. The patient agreed to proceed with the above mentioned plan. Further management will depend upon hospital course. Disposition Plan: Back to previous home environment Consults called: Nephrology.  All the records are reviewed and case discussed with ED provider.  Status is: Inpatient   At the time of the admission, it appears that the appropriate admission status for this patient is inpatient.  This is judged to be reasonable and necessary in order to provide the required intensity of service to ensure the patient's safety given the presenting symptoms, physical exam findings and initial radiographic and laboratory data in the context of comorbid conditions.  The patient requires inpatient status due to high intensity of service, high risk of further  deterioration and high frequency of surveillance required.  I certify that at the time of admission, it is my clinical judgment that the patient will require inpatient hospital care extending more than 2 midnights.                            Dispo: The patient is from: Home              Anticipated d/c is to: Home              Patient currently is not medically stable to d/c.              Difficult to place patient: No      Christel Mormon M.D on 10/17/2021 at 12:41 AM  [facility]   From 7 PM-7 AM, contact night-coverage [email or web address]  CC: Primary care physician; Marguerita Merles, MD

## 2021-10-17 NOTE — Progress Notes (Signed)
PROGRESS NOTE    Krista Gordon  VZD:[id number] DOB: 02-Aug-1941 DOA: 10/16/2021 PCP: Marguerita Merles, MD    Brief Narrative:  81 y.o. 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 intake.  She has been having cough productive of whitish sputum over the last 3 days without dyspnea or fever or chills.  She denies any nausea or vomiting or diarrhea.  No loss of taste or smell.  No chest pain or palpitations. Pt was found to be covid pos  Assessment & Plan:   Principal Problem:   Pneumonia due to COVID-19 virus    1.  Multifocal pneumonia secondary to COVID-19. -Given multifocal pneumonia we will empirically place the patient on IV Rocephin and Zithromax for possible bacterial superinfection only with elevated Procalcitonin. -The patient will be placed on scheduled Mucinex and as needed Tussionex. -cont bronchodilator as needed - Pt is continued on IV Remdesivir and IV steroid therapy with IV Solu-Medrol. -The patient will be placed on vitamin D3, vitamin C, zinc sulfate, p.o. Pepcid and aspirin.  2.  End-stage renal disease on hemodialysis. - Nephrology consulted. - Plan for HD as scheduled  3.  Dyslipidemia. - We will continue statin therapy.  4.  Hypertension - We will continue her antihypertensives.  5.  Type 2 diabetes mellitus. - We will continue her antidiabetics and place her on supplement coverage with NovoLog.    DVT prophylaxis: Heparin subq Code Status: DNR Family Communication: Pt in room, family not at bedside  Status is: Inpatient  Remains inpatient appropriate because: severity of illness     Consultants:  Nephrology  Procedures:    Antimicrobials: Anti-infectives (From admission, onward)     Start     Dose/Rate Route Frequency Ordered Stop   10/17/21 1000  remdesivir 100 mg in sodium chloride 0.9 % 100 mL IVPB       See Hyperspace for full Linked Orders Report.   100 mg 200 mL/hr over 30 Minutes Intravenous Daily 10/16/21 2304 10/21/21 0959   10/17/21 1000  remdesivir 100 mg in sodium chloride 0.9 % 100 mL IVPB  Status:  Discontinued       See Hyperspace for full Linked Orders Report.   100 mg 200 mL/hr over 30 Minutes Intravenous Daily 10/16/21 2348 10/16/21 2355   10/17/21 0000  remdesivir 200 mg in sodium chloride 0.9% 250 mL IVPB       See Hyperspace for full Linked Orders Report.   200 mg 580 mL/hr over 30 Minutes Intravenous Once 10/16/21 2304 10/17/21 0132   10/17/21 0000  remdesivir 200 mg in sodium chloride 0.9% 250 mL IVPB  Status:  Discontinued       See Hyperspace for full Linked Orders Report.   200 mg 580 mL/hr over 30 Minutes Intravenous Once 10/16/21 2348 10/16/21 2355       Subjective: States feeling somewhat better  Objective: Vitals:   10/17/21 1345 10/17/21 1700 10/17/21 1730 10/17/21 1800  BP: 108/64  112/76 105/63  Pulse: 80 81 79 78  Resp: 18   18  Temp:      TempSrc:      SpO2: 92% 98% 93% 92%  Weight:      Height:       No intake or output data in the 24  hours ending 10/17/21 1827 Filed Weights   10/16/21 1413 10/16/21 2238  Weight: 63 kg 62.4 kg    Examination: General exam: Awake, laying in bed, in nad Respiratory system: Normal respiratory effort, no wheezing Cardiovascular system: regular rate, s1, s2 Gastrointestinal system: Soft, nondistended, positive BS Central nervous system: CN2-12 grossly intact, strength intact Extremities: Perfused, no clubbing Skin: Normal skin turgor, no notable skin lesions seen Psychiatry: Mood normal // no visual hallucinations    Data Reviewed: I have personally reviewed following labs and imaging studies  CBC: Recent Labs  Lab 10/16/21 1419 10/16/21 2356 10/17/21 0500  WBC 6.7 5.1  4.5  NEUTROABS  --   --  3.9  HGB 10.7* 10.2* 10.1*  HCT 37.0 34.6* 34.8*  MCV 106.6* 107.1* 106.7*  PLT 98* 88* 90*   Basic Metabolic Panel: Recent Labs  Lab 10/16/21 1419 10/16/21 2356 10/17/21 0500  NA 136  --  138  K 4.4  --  5.1  CL 105  --  106  CO2 23  --  24  GLUCOSE 88  --  96  BUN 25*  --  37*  CREATININE 5.27* 5.98* 6.11*  CALCIUM 8.1*  --  8.1*   GFR: Estimated Creatinine Clearance: 6.6 mL/min (A) (by C-G formula based on SCr of 6.11 mg/dL (H)). Liver Function Tests: Recent Labs  Lab 10/16/21 1419 10/17/21 0500  AST 44* 40  ALT 18 16  ALKPHOS 67 67  BILITOT 0.8 0.9  PROT 6.7 6.3*  ALBUMIN 2.8* 2.7*   No results for input(s): LIPASE, AMYLASE in the last 168 hours. No results for input(s): AMMONIA in the last 168 hours. Coagulation Profile: No results for input(s): INR, PROTIME in the last 168 hours. Cardiac Enzymes: No results for input(s): CKTOTAL, CKMB, CKMBINDEX, TROPONINI in the last 168 hours. BNP (last 3 results) No results for input(s): PROBNP in the last 8760 hours. HbA1C: No results for input(s): HGBA1C in the last 72 hours. CBG: Recent Labs  Lab 10/17/21 1142  GLUCAP 91   Lipid Profile: No results for input(s): CHOL, HDL, LDLCALC, TRIG, CHOLHDL, LDLDIRECT in the last 72 hours. Thyroid Function Tests: No results for input(s): TSH, T4TOTAL, FREET4, T3FREE, THYROIDAB in the last 72 hours. Anemia Panel: Recent Labs    10/16/21 2356 10/17/21 0500  FERRITIN 2,164* 2,247*   Sepsis Labs: Recent Labs  Lab 10/16/21 2356  PROCALCITON 0.74    Recent Results (from the past 240 hour(s))  Resp Panel by RT-PCR (Flu A&B, Covid) Nasopharyngeal Swab     Status: Abnormal   Collection Time: 10/16/21  2:19 PM   Specimen: Nasopharyngeal Swab; Nasopharyngeal(NP) swabs in vial transport medium  Result Value Ref Range Status   SARS Coronavirus 2 by RT PCR POSITIVE (A) NEGATIVE Final    Comment: (NOTE) SARS-CoV-2 target nucleic acids are  DETECTED.  The SARS-CoV-2 RNA is generally detectable in upper respiratory specimens during the acute phase of infection. Positive results are indicative of the presence of the identified virus, but do not rule out bacterial infection or co-infection with other pathogens not detected by the test. Clinical correlation with patient history and other diagnostic information is necessary to determine patient infection status. The expected result is Negative.  Fact Sheet for Patients: EntrepreneurPulse.com.au  Fact Sheet for Healthcare Providers: IncredibleEmployment.be  This test is not yet approved or cleared by the [place] FDA and  has been authorized for detection and/or diagnosis of SARS-CoV-2 by FDA under an Emergency Use Authorization (EUA).  This  EUA will remain in effect (meaning this test can be used) for the duration of  the COVID-19 declaration under Section 564(b)(1) of the A ct, 21 U.S.C. section 360bbb-3(b)(1), unless the authorization is terminated or revoked sooner.     Influenza A by PCR NEGATIVE NEGATIVE Final   Influenza B by PCR NEGATIVE NEGATIVE Final    Comment: (NOTE) The Xpert Xpress SARS-CoV-2/FLU/RSV plus assay is intended as an aid in the diagnosis of influenza from Nasopharyngeal swab specimens and should not be used as a sole basis for treatment. Nasal washings and aspirates are unacceptable for Xpert Xpress SARS-CoV-2/FLU/RSV testing.  Fact Sheet for Patients: EntrepreneurPulse.com.au  Fact Sheet for Healthcare Providers: IncredibleEmployment.be  This test is not yet approved or cleared by the [place] FDA and has been authorized for detection and/or diagnosis of SARS-CoV-2 by FDA under an Emergency Use Authorization (EUA). This EUA will remain in effect (meaning this test can be used) for the duration of the COVID-19 declaration under Section 564(b)(1) of the Act, 21  U.S.C. section 360bbb-3(b)(1), unless the authorization is terminated or revoked.  Performed at [facility] [address] [place], [place] [address]   Expectorated Sputum Assessment w Gram Stain, Rflx to Resp Cult     Status: None   Collection Time: 10/17/21  5:00 AM   Specimen: Sputum  Result Value Ref Range Status   Specimen Description   Final    SPUTUM Performed at [facility], [address], [place], [place] [address]    Special Requests   Final    NONE Performed at [facility], [address] [place], [place] [address]    Sputum evaluation   Final    Sputum specimen not acceptable for testing.  Please recollect.   Results Called to: N. POLIUM 10/17/21 @ 1603 FH Performed at [facility], [place] [address] [place], [place] [address]    Report Status 10/17/2021 FINAL  Final     Radiology Studies: DG Chest 2 View  Result Date: 10/16/2021 CLINICAL DATA:  COVID. Decreased appetite for 3 days. Weakness. Dialysis patient. EXAM: CHEST - 2 VIEW COMPARISON:  None. FINDINGS: The heart is enlarged. Diffuse interstitial prominence noted. This is greatest at the lung bases. No significant airspace consolidation is present. Fused anterior osteophytes are present within the thoracic spine. IMPRESSION: 1. Cardiomegaly with diffuse interstitial prominence. While this may represent edema, viral infection is also considered. Electronically Signed   By: San Morelle M.D.   On: 10/16/2021 20:24    Scheduled Meds:  vitamin C  500 mg Oral Daily   aspirin EC  81 mg Oral QHS   atorvastatin  40 mg Oral QHS   calcitonin (salmon)  1 spray Alternating Nares QHS   cholecalciferol  2,000 Units Oral Daily   [START ON 10/18/2021] colchicine  0.6 mg Oral QODAY   famotidine  10 mg Oral BID   ferrous sulfate  325 mg Oral Daily   heparin  5,000 Units Subcutaneous Q8H   insulin aspart  0-5 Units Subcutaneous QHS   insulin aspart  0-9 Units Subcutaneous TID WC    methylPREDNISolone (SOLU-MEDROL) injection  1 mg/kg Intravenous Q12H   Followed by   Derrill Memo ON 10/20/2021] predniSONE  50 mg Oral Daily   multivitamin with minerals  1 tablet Oral Daily   sodium bicarbonate  1,300 mg Oral BID   torsemide  10 mg Oral Daily   zinc sulfate  220 mg Oral Daily   Continuous  Infusions:  remdesivir 100 mg in NS 100 mL Stopped (10/17/21 1144)     LOS: 1 day   Krista Lund, MD [facility] Pager On Amion  If 7PM-7AM, please contact night-coverage 10/17/2021, 6:27 PM

## 2021-10-17 NOTE — ED Notes (Signed)
RN at bedside to answer call bell. Pt placed on bed pan to have BM.

## 2021-10-17 NOTE — ED Notes (Signed)
Pt CBG 86. Glucometer docked but not crossing over

## 2021-10-18 LAB — CBC WITH DIFFERENTIAL/PLATELET
Abs Immature Granulocytes: 0.06 10*3/uL (ref 0.00–0.07)
Basophils Absolute: 0 10*3/uL (ref 0.0–0.1)
Basophils Relative: 0 %
Eosinophils Absolute: 0 10*3/uL (ref 0.0–0.5)
Eosinophils Relative: 0 %
HCT: 32.6 % — ABNORMAL LOW (ref 36.0–46.0)
Hemoglobin: 10.1 g/dL — ABNORMAL LOW (ref 12.0–15.0)
Immature Granulocytes: 1 %
Lymphocytes Relative: 12 %
Lymphs Abs: 0.9 10*3/uL (ref 0.7–4.0)
MCH: 31.5 pg (ref 26.0–34.0)
MCHC: 31 g/dL (ref 30.0–36.0)
MCV: 101.6 fL — ABNORMAL HIGH (ref 80.0–100.0)
Monocytes Absolute: 0.3 10*3/uL (ref 0.1–1.0)
Monocytes Relative: 4 %
Neutro Abs: 6.1 10*3/uL (ref 1.7–7.7)
Neutrophils Relative %: 83 %
Platelets: 125 10*3/uL — ABNORMAL LOW (ref 150–400)
RBC: 3.21 MIL/uL — ABNORMAL LOW (ref 3.87–5.11)
RDW: 13.3 % (ref 11.5–15.5)
WBC: 7.4 10*3/uL (ref 4.0–10.5)
nRBC: 0 % (ref 0.0–0.2)

## 2021-10-18 LAB — GLUCOSE, CAPILLARY
Glucose-Capillary: 148 mg/dL — ABNORMAL HIGH (ref 70–99)
Glucose-Capillary: 159 mg/dL — ABNORMAL HIGH (ref 70–99)
Glucose-Capillary: 149 mg/dL — ABNORMAL HIGH (ref 70–99)
Glucose-Capillary: 133 mg/dL — ABNORMAL HIGH (ref 70–99)
Glucose-Capillary: 115 mg/dL — ABNORMAL HIGH (ref 70–99)

## 2021-10-18 LAB — HEPATITIS B SURFACE ANTIGEN: Hepatitis B Surface Ag: NONREACTIVE

## 2021-10-18 LAB — COMPREHENSIVE METABOLIC PANEL
ALT: 15 U/L (ref 0–44)
AST: 35 U/L (ref 15–41)
Albumin: 2.7 g/dL — ABNORMAL LOW (ref 3.5–5.0)
Alkaline Phosphatase: 68 U/L (ref 38–126)
Anion gap: 12 (ref 5–15)
BUN: 65 mg/dL — ABNORMAL HIGH (ref 8–23)
CO2: 22 mmol/L (ref 22–32)
Calcium: 7.8 mg/dL — ABNORMAL LOW (ref 8.9–10.3)
Chloride: 104 mmol/L (ref 98–111)
Creatinine, Ser: 6.62 mg/dL — ABNORMAL HIGH (ref 0.44–1.00)
GFR, Estimated: 6 mL/min — ABNORMAL LOW (ref >60)
Glucose, Bld: 125 mg/dL — ABNORMAL HIGH (ref 70–99)
Potassium: 4.8 mmol/L (ref 3.5–5.1)
Sodium: 138 mmol/L (ref 135–145)
Total Bilirubin: 0.7 mg/dL (ref 0.3–1.2)
Total Protein: 6.3 g/dL — ABNORMAL LOW (ref 6.5–8.1)

## 2021-10-18 LAB — MRSA NEXT GEN BY PCR, NASAL: MRSA by PCR Next Gen: NOT DETECTED

## 2021-10-18 LAB — HEMOGLOBIN A1C
Hgb A1c MFr Bld: 4.3 % — ABNORMAL LOW (ref 4.8–5.6)
Mean Plasma Glucose: 76.71 mg/dL

## 2021-10-18 LAB — C-REACTIVE PROTEIN: CRP: 6.9 mg/dL — ABNORMAL HIGH (ref <1.0)

## 2021-10-18 LAB — D-DIMER, QUANTITATIVE: D-Dimer, Quant: 1.8 ug/mL-FEU — ABNORMAL HIGH (ref 0.00–0.50)

## 2021-10-18 LAB — HEPATITIS B SURFACE ANTIBODY,QUALITATIVE: Hep B S Ab: NONREACTIVE

## 2021-10-18 LAB — FERRITIN: Ferritin: 1912 ng/mL — ABNORMAL HIGH (ref 11–307)

## 2021-10-18 MED ORDER — SODIUM CHLORIDE 0.9 % IV SOLN: 100.0000 mL | INTRAVENOUS | Status: DC | PRN

## 2021-10-18 MED ORDER — ALTEPLASE 2 MG IJ SOLR: 2.0000 mg | Freq: Once | INTRAMUSCULAR | Status: DC | PRN

## 2021-10-18 MED ORDER — NEPRO/CARBSTEADY PO LIQD
237.0000 mL | Freq: Three times a day (TID) | ORAL | Status: DC
  Administered 2021-10-18 – 2021-10-20 (×5): 237 mL via ORAL

## 2021-10-18 MED ORDER — LIDOCAINE-PRILOCAINE 2.5-2.5 % EX CREA
1.0000 "application " | TOPICAL_CREAM | CUTANEOUS | Status: DC | PRN
  Filled 2021-10-18: qty 5

## 2021-10-18 MED ORDER — MUPIROCIN 2 % EX OINT
TOPICAL_OINTMENT | Freq: Two times a day (BID) | CUTANEOUS | Status: DC
  Administered 2021-10-19: 1 via TOPICAL
  Filled 2021-10-18: qty 22

## 2021-10-18 MED ORDER — RENA-VITE PO TABS
1.0000 | ORAL_TABLET | Freq: Every day | ORAL | Status: DC
  Administered 2021-10-18 – 2021-10-19 (×2): 1 via ORAL
  Filled 2021-10-18 (×2): qty 1

## 2021-10-18 MED ORDER — LIDOCAINE HCL (PF) 1 % IJ SOLN
5.0000 mL | INTRAMUSCULAR | Status: DC | PRN
  Filled 2021-10-18: qty 5

## 2021-10-18 MED ORDER — PENTAFLUOROPROP-TETRAFLUOROETH EX AERO
1.0000 "application " | INHALATION_SPRAY | CUTANEOUS | Status: DC | PRN
  Filled 2021-10-18: qty 30

## 2021-10-18 MED ORDER — HEPARIN SODIUM (PORCINE) 1000 UNIT/ML DIALYSIS: 1000.0000 [IU] | INTRAMUSCULAR | Status: DC | PRN

## 2021-10-18 MED ORDER — CHLORHEXIDINE GLUCONATE CLOTH 2 % EX PADS
6.0000 | MEDICATED_PAD | Freq: Every day | CUTANEOUS | Status: DC
  Administered 2021-10-18 – 2021-10-20 (×3): 6 via TOPICAL

## 2021-10-18 NOTE — Progress Notes (Signed)
Notified by NT [place] patient had perianal bulge. Notified MD of possible prolapse; requested MD to bedside. Vaginal ultrasound ordered; MD states he will assess on arrival.

## 2021-10-18 NOTE — TOC Initial Note (Signed)
Transition of Care [facility]) - Initial/Assessment Note    Patient Details  Name: Krista Gordon MRN: [id number] Date of Birth: [birth year]  Transition of Care [facility]) CM/SW Contact:    Beverly Sessions, RN Phone Number: 10/18/2021, 2:57 PM  Clinical Narrative:                  Transition of Care (TOC) Screening Note   Patient Details  Name: Krista Gordon Date of Birth: Sep 11, 1941   Transition of Care [facility]) CM/SW Contact:    Beverly Sessions, RN Phone Number: 10/18/2021, 2:57 PM    Transition of Care Department [facility]) has reviewed patient and no TOC needs have been identified at this time. We will continue to monitor patient advancement through interdisciplinary progression rounds. If new patient transition needs arise, please place a TOC consult.          Patient Goals and CMS Choice        Expected Discharge Plan and Services                                                Prior Living Arrangements/Services                       Activities of Daily Living Home Assistive Devices/Equipment: Dentures (specify type) ADL Screening (condition at time of admission) Patient's cognitive ability adequate to safely complete daily activities?: Yes Is the patient deaf or have difficulty hearing?: No Does the patient have difficulty seeing, even when wearing glasses/contacts?: No Does the patient have difficulty concentrating, remembering, or making decisions?: No Patient able to express need for assistance with ADLs?: Yes Does the patient have difficulty dressing or bathing?: No Independently performs ADLs?: Yes (appropriate for developmental age) Does the patient have difficulty walking or climbing stairs?: Yes Weakness of Legs: Both Weakness of Arms/Hands: None  Permission Sought/Granted                  Emotional Assessment              Admission diagnosis:  Thrombocytopenia ([place]) [D69.6] Hypotension, unspecified hypotension type  [I95.9] Pneumonia due to COVID-19 virus [U07.1, J12.82] COVID-19 [U07.1] Patient Active Problem List   Diagnosis Date Noted   Pneumonia due to COVID-19 virus 10/16/2021   CKD (chronic kidney disease), stage V ([place]) 06/21/2018   Nephrolithiasis 06/20/2018   Chronic kidney disease, stage III (moderate) ([place]) 06/20/2018   Diabetes ([place]) 06/20/2018   Essential hypertension 06/20/2018   Hematuria 06/20/2018   HLD (hyperlipidemia) 06/20/2018   Proteinuria 06/20/2018   PCP:  Marguerita Merles, MD Pharmacy:   [id number] - [place], [place] - [address] [place] [address] Phone: [phone] Fax: [phone]     Social Determinants of Health (SDOH) Interventions    Readmission Risk Interventions No flowsheet data found.

## 2021-10-18 NOTE — Progress Notes (Signed)
PROGRESS NOTE    Krista Gordon  LOV:[id number] DOB: 25-May-1941 DOA: 10/16/2021 PCP: Marguerita Merles, MD    Brief Narrative:  81 y.o. 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 intake.  She has been having cough productive of whitish sputum over the last 3 days without dyspnea or fever or chills.  She denies any nausea or vomiting or diarrhea.  No loss of taste or smell.  No chest pain or palpitations. Pt was found to be covid pos  Assessment & Plan:   Principal Problem:   Pneumonia due to COVID-19 virus    1.  Multifocal pneumonia secondary to COVID-19. -Given multifocal pneumonia pt was started on empiric IV Rocephin and Zithromax for possible bacterial superinfection only with elevated Procalcitonin of over 0.7 -cont bronchodilator as needed - Cont on IV Remdesivir and IV steroid therapy with IV Solu-Medrol. -The patient will be placed on vitamin D3, vitamin C, zinc sulfate, p.o. Pepcid and aspirin.  2.  End-stage renal disease on hemodialysis. - Nephrology consulted. - Plan for HD scheduled today  3.  Dyslipidemia. - We will continue statin therapy. -LFT's stable  4.  Hypertension - We will continue her antihypertensives. -BP stable  5.  Type 2 diabetes mellitus. - cont on SSI as needed -glycemic trends reviewed, stable    DVT prophylaxis: Heparin subq Code Status: DNR Family Communication: Pt in room, family not at bedside  Status is: Inpatient  Remains inpatient appropriate because: severity of illness     Consultants:  Nephrology  Procedures:    Antimicrobials: Anti-infectives (From admission, onward)    Start     Dose/Rate Route Frequency Ordered Stop   10/17/21 1000  remdesivir 100 mg in  sodium chloride 0.9 % 100 mL IVPB       See Hyperspace for full Linked Orders Report.   100 mg 200 mL/hr over 30 Minutes Intravenous Daily 10/16/21 2304 10/21/21 0959   10/17/21 1000  remdesivir 100 mg in sodium chloride 0.9 % 100 mL IVPB  Status:  Discontinued       See Hyperspace for full Linked Orders Report.   100 mg 200 mL/hr over 30 Minutes Intravenous Daily 10/16/21 2348 10/16/21 2355   10/17/21 0000  remdesivir 200 mg in sodium chloride 0.9% 250 mL IVPB       See Hyperspace for full Linked Orders Report.   200 mg 580 mL/hr over 30 Minutes Intravenous Once 10/16/21 2304 10/17/21 0132   10/17/21 0000  remdesivir 200 mg in sodium chloride 0.9% 250 mL IVPB  Status:  Discontinued       See Hyperspace for full Linked Orders Report.   200 mg 580 mL/hr over 30 Minutes Intravenous Once 10/16/21 2348 10/16/21 2355       Subjective: Reports feeling better today  Objective: Vitals:   10/18/21 0100 10/18/21 0511 10/18/21 0732 10/18/21 1508  BP: 135/71 109/79 (!) 99/59 119/61  Pulse: 70 79 71 69  Resp: 20 18 19 19   Temp: 97.9 F (36.6 C) 98.4 F (36.9 C) 98.5 F (36.9 C) 98.2 F (36.8 C)  TempSrc: Oral Oral Oral Oral  SpO2: 95% 100% 94% 96%  Weight: 63.6 kg     Height: 5' 2.01" (1.575 m)       Intake/Output Summary (Last  24 hours) at 10/18/2021 1759 Last data filed at 10/18/2021 1515 Gross per 24 hour  Intake 200 ml  Output --  Net 200 ml   Filed Weights   10/16/21 1413 10/16/21 2238 10/18/21 0100  Weight: 63 kg 62.4 kg 63.6 kg    Examination: General exam: Conversant, in no acute distress Respiratory system: normal chest rise, clear, no audible wheezing Cardiovascular system: regular rhythm, s1-s2 Gastrointestinal system: Nondistended, nontender, pos BS Central nervous system: No seizures, no tremors Extremities: No cyanosis, no joint deformities Skin: No rashes, no pallor Psychiatry: Affect normal // no auditory hallucinations   Data Reviewed: I have personally  reviewed following labs and imaging studies  CBC: Recent Labs  Lab 10/16/21 1419 10/16/21 2356 10/17/21 0500 10/18/21 0538  WBC 6.7 5.1 4.5 7.4  NEUTROABS  --   --  3.9 6.1  HGB 10.7* 10.2* 10.1* 10.1*  HCT 37.0 34.6* 34.8* 32.6*  MCV 106.6* 107.1* 106.7* 101.6*  PLT 98* 88* 90* 125*    Basic Metabolic Panel: Recent Labs  Lab 10/16/21 1419 10/16/21 2356 10/17/21 0500 10/18/21 0538  NA 136  --  138 138  K 4.4  --  5.1 4.8  CL 105  --  106 104  CO2 23  --  24 22  GLUCOSE 88  --  96 125*  BUN 25*  --  37* 65*  CREATININE 5.27* 5.98* 6.11* 6.62*  CALCIUM 8.1*  --  8.1* 7.8*    GFR: Estimated Creatinine Clearance: 6.1 mL/min (A) (by C-G formula based on SCr of 6.62 mg/dL (H)). Liver Function Tests: Recent Labs  Lab 10/16/21 1419 10/17/21 0500 10/18/21 0538  AST 44* 40 35  ALT 18 16 15   ALKPHOS 67 67 68  BILITOT 0.8 0.9 0.7  PROT 6.7 6.3* 6.3*  ALBUMIN 2.8* 2.7* 2.7*    No results for input(s): LIPASE, AMYLASE in the last 168 hours. No results for input(s): AMMONIA in the last 168 hours. Coagulation Profile: No results for input(s): INR, PROTIME in the last 168 hours. Cardiac Enzymes: No results for input(s): CKTOTAL, CKMB, CKMBINDEX, TROPONINI in the last 168 hours. BNP (last 3 results) No results for input(s): PROBNP in the last 8760 hours. HbA1C: Recent Labs    10/17/21 0511  HGBA1C 4.3*   CBG: Recent Labs  Lab 10/17/21 1142 10/17/21 2334 10/18/21 0727 10/18/21 1124 10/18/21 1626  GLUCAP 91 146* 148* 159* 149*    Lipid Profile: No results for input(s): CHOL, HDL, LDLCALC, TRIG, CHOLHDL, LDLDIRECT in the last 72 hours. Thyroid Function Tests: No results for input(s): TSH, T4TOTAL, FREET4, T3FREE, THYROIDAB in the last 72 hours. Anemia Panel: Recent Labs    10/17/21 0500 10/18/21 0538  FERRITIN 2,247* 1,912*    Sepsis Labs: Recent Labs  Lab 10/16/21 2356  PROCALCITON 0.74     Recent Results (from the past 240 hour(s))  Resp  Panel by RT-PCR (Flu A&B, Covid) Nasopharyngeal Swab     Status: Abnormal   Collection Time: 10/16/21  2:19 PM   Specimen: Nasopharyngeal Swab; Nasopharyngeal(NP) swabs in vial transport medium  Result Value Ref Range Status   SARS Coronavirus 2 by RT PCR POSITIVE (A) NEGATIVE Final    Comment: (NOTE) SARS-CoV-2 target nucleic acids are DETECTED.  The SARS-CoV-2 RNA is generally detectable in upper respiratory specimens during the acute phase of infection. Positive results are indicative of the presence of the identified virus, but do not rule out bacterial infection or co-infection with other pathogens not detected by the  test. Clinical correlation with patient history and other diagnostic information is necessary to determine patient infection status. The expected result is Negative.  Fact Sheet for Patients: EntrepreneurPulse.com.au  Fact Sheet for Healthcare Providers: IncredibleEmployment.be  This test is not yet approved or cleared by the [place] FDA and  has been authorized for detection and/or diagnosis of SARS-CoV-2 by FDA under an Emergency Use Authorization (EUA).  This EUA will remain in effect (meaning this test can be used) for the duration of  the COVID-19 declaration under Section 564(b)(1) of the A ct, 21 U.S.C. section 360bbb-3(b)(1), unless the authorization is terminated or revoked sooner.     Influenza A by PCR NEGATIVE NEGATIVE Final   Influenza B by PCR NEGATIVE NEGATIVE Final    Comment: (NOTE) The Xpert Xpress SARS-CoV-2/FLU/RSV plus assay is intended as an aid in the diagnosis of influenza from Nasopharyngeal swab specimens and should not be used as a sole basis for treatment. Nasal washings and aspirates are unacceptable for Xpert Xpress SARS-CoV-2/FLU/RSV testing.  Fact Sheet for Patients: EntrepreneurPulse.com.au  Fact Sheet for Healthcare  Providers: IncredibleEmployment.be  This test is not yet approved or cleared by the [place] FDA and has been authorized for detection and/or diagnosis of SARS-CoV-2 by FDA under an Emergency Use Authorization (EUA). This EUA will remain in effect (meaning this test can be used) for the duration of the COVID-19 declaration under Section 564(b)(1) of the Act, 21 U.S.C. section 360bbb-3(b)(1), unless the authorization is terminated or revoked.  Performed at [facility], [address] [place], [place] [address]   Expectorated Sputum Assessment w Gram Stain, Rflx to Resp Cult     Status: None   Collection Time: 10/17/21  5:00 AM   Specimen: Sputum  Result Value Ref Range Status   Specimen Description   Final    SPUTUM Performed at [facility], [address] [place], [place] [address]    Special Requests   Final    NONE Performed at [facility], [address] [place], [place] [address]    Sputum evaluation   Final    Sputum specimen not acceptable for testing.  Please recollect.   Results Called to: N. POLIUM 10/17/21 @ 1603 FH Performed at [facility], [place] [address], [place], [place] [address]    Report Status 10/17/2021 FINAL  Final  MRSA Next Gen by PCR, Nasal     Status: None   Collection Time: 10/18/21  5:22 AM   Specimen: Nasal Mucosa; Nasal Swab  Result Value Ref Range Status   MRSA by PCR Next Gen NOT DETECTED NOT DETECTED Final    Comment: (NOTE) The GeneXpert MRSA Assay (FDA approved for NASAL specimens only), is one component of a comprehensive MRSA colonization surveillance program. It is not intended to diagnose MRSA infection nor to guide or monitor treatment for MRSA infections. Test performance is not FDA approved in patients less than 79 years old. Performed at [facility], [address], [place], [place] [address]       Radiology Studies: DG Chest 2 View  Result Date:  10/16/2021 CLINICAL DATA:  COVID. Decreased appetite for 3 days. Weakness. Dialysis patient. EXAM: CHEST - 2 VIEW COMPARISON:  None. FINDINGS: The heart is enlarged. Diffuse interstitial prominence noted. This is greatest at the lung bases. No significant airspace consolidation is present. Fused anterior osteophytes are present within the thoracic spine. IMPRESSION: 1. Cardiomegaly with diffuse interstitial prominence. While this may represent edema, viral  infection is also considered. Electronically Signed   By: San Morelle M.D.   On: 10/16/2021 20:24    Scheduled Meds:  vitamin C  500 mg Oral Daily   aspirin EC  81 mg Oral QHS   atorvastatin  40 mg Oral QHS   calcitonin (salmon)  1 spray Alternating Nares QHS   Chlorhexidine Gluconate Cloth  6 each Topical Q0600   cholecalciferol  2,000 Units Oral Daily   colchicine  0.6 mg Oral QODAY   famotidine  10 mg Oral BID   feeding supplement (NEPRO CARB STEADY)  237 mL Oral TID BM   ferrous sulfate  325 mg Oral Daily   heparin  5,000 Units Subcutaneous Q8H   insulin aspart  0-5 Units Subcutaneous QHS   insulin aspart  0-9 Units Subcutaneous TID WC   methylPREDNISolone (SOLU-MEDROL) injection  1 mg/kg Intravenous Q12H   Followed by   Derrill Memo ON 10/20/2021] predniSONE  50 mg Oral Daily   multivitamin  1 tablet Oral QHS   mupirocin ointment   Topical BID   sodium bicarbonate  1,300 mg Oral BID   torsemide  10 mg Oral Daily   zinc sulfate  220 mg Oral Daily   Continuous Infusions:  remdesivir 100 mg in NS 100 mL Stopped (10/18/21 1254)     LOS: 2 days   Marylu Lund, MD [facility] Pager On Amion  If 7PM-7AM, please contact night-coverage 10/18/2021, 5:59 PM

## 2021-10-18 NOTE — Progress Notes (Signed)
HD completed, however no fluid removal done d/t the episode of symptomatic hypotension.   After that episode systolic bp keeps on going down to 90s. Although, pt is already asymptomatic, UF off was maintained through out tx. Dr Candiss Norse was aware and he ordered to give another 250 cc Nss. Post HD bp 111/60. Pt has no complaints and asymptomatic.

## 2021-10-18 NOTE — Consult Note (Signed)
[facility]  CONSULT NOTE    Date: 10/18/2021                  Patient Name:  Krista Gordon  MRN: [id number]  DOB: [birth year]  Age / Sex: 81 y.o., female         PCP: Marguerita Merles, MD                 Service Requesting Consult: [facility]                 Reason for Consult: End-stage renal disease on dialysis            History of Present Illness: Krista Gordon is a 82 y.o.  female with a past medical history including dyslipidemia, hypertension, diabetes, and end-stage renal disease on hemodialysis, who was admitted to [facility] on 10/16/2021 for Thrombocytopenia ([place]) [D69.6] Hypotension, unspecified hypotension type [I95.9] Pneumonia due to COVID-19 virus [U07.1, J12.82] COVID-19 [U07.1]  Patient currently receives outpatient dialysis at [facility], supervised by [facility] physicians.  Patient presents to the emergency department with complaints of fatigue and tiredness for 4 days.  She does report decreased appetite but denies nausea and vomiting.  Currently denies shortness of breath, cough, and diarrhea.  Patient is currently confused and unable to follow conversation.  Chart review determined patient last received dialysis on Saturday.  Patient is found to be COVID-positive.  We have been consulted to provide dialysis during this admission.  Dialysis scheduled for later today.   Medications: Outpatient medications: Medications Prior to Admission  Medication Sig Dispense Refill Last Dose   Cholecalciferol (VITAMIN D) 2000 units tablet Take 2,000 Units by mouth daily.   10/15/2021   ferrous sulfate 325 (65 FE) MG tablet Take 325 mg by mouth daily.    10/15/2021   torsemide (DEMADEX) 10 MG tablet Take 10 mg by mouth daily.   10/16/2021   alendronate (FOSAMAX) 70 MG tablet Take 70 mg by mouth every Monday. Take with a full glass of water on an empty stomach.    10/10/2021   amLODipine (NORVASC) 5 MG tablet Take 5 mg by mouth at bedtime.    10/15/2021    aspirin 81 MG EC tablet Take 81 mg by mouth at bedtime. Swallow whole.    10/15/2021   atorvastatin (LIPITOR) 40 MG tablet Take 40 mg by mouth at bedtime.    10/15/2021   calcitonin, salmon, (MIACALCIN/FORTICAL) 200 UNIT/ACT nasal spray Place 1 spray into alternate nostrils at bedtime.   10/15/2021   colchicine 0.6 MG tablet Take 0.6 mg by mouth at bedtime.    prn at unknown   glipiZIDE (GLUCOTROL XL) 5 MG 24 hr tablet Take 5 mg by mouth at bedtime.   10/15/2021   linagliptin (TRADJENTA) 5 MG TABS tablet Take 5 mg by mouth at bedtime.    10/15/2021   Multiple Vitamin (MULTIVITAMIN WITH MINERALS) TABS tablet Take 1 tablet by mouth at bedtime.   10/15/2021   PROAIR HFA 108 (90 Base) MCG/ACT inhaler Inhale 2 puffs into the lungs every 6 (six) hours as needed for shortness of breath.  2 prn at unknown   quinapril (ACCUPRIL) 40 MG tablet Take 40 mg by mouth at bedtime.   10/15/2021   sodium bicarbonate 650 MG tablet Take 650 mg by mouth daily.   3 10/15/2021    Current medications: Current Facility-Administered Medications  Medication Dose Route Frequency Provider Last Rate Last Admin   acetaminophen (  TYLENOL) tablet 650 mg  650 mg Oral Q6H PRN Mansy, Arvella Merles, MD       albuterol (VENTOLIN HFA) 108 (90 Base) MCG/ACT inhaler 2 puff  2 puff Inhalation Q6H PRN Mansy, Jan A, MD       ascorbic acid (VITAMIN C) tablet 500 mg  500 mg Oral Daily Mansy, Jan A, MD   500 mg at 10/18/21 1127   aspirin EC tablet 81 mg  81 mg Oral QHS Mansy, Jan A, MD   81 mg at 10/18/21 0003   atorvastatin (LIPITOR) tablet 40 mg  40 mg Oral QHS Mansy, Jan A, MD   40 mg at 10/18/21 0003   calcitonin (salmon) (MIACALCIN/FORTICAL) nasal spray 1 spray  1 spray Alternating Nares QHS Mansy, Jan A, MD       Chlorhexidine Gluconate Cloth 2 % PADS 6 each  6 each Topical Q0600 Colon Flattery, NP   6 each at 10/18/21 1127   chlorpheniramine-HYDROcodone ([place]) 10-8 MG/5ML suspension 5 mL  5 mL Oral Q12H PRN Mansy, Jan A, MD        cholecalciferol (VITAMIN D3) tablet 2,000 Units  2,000 Units Oral Daily Mansy, Jan A, MD   2,000 Units at 10/18/21 1126   colchicine tablet 0.6 mg  0.6 mg Oral QODAY Mansy, Jan A, MD   0.6 mg at 10/18/21 1127   famotidine (PEPCID) tablet 10 mg  10 mg Oral BID Mansy, Jan A, MD   10 mg at 10/18/21 1127   ferrous sulfate tablet 325 mg  325 mg Oral Daily Mansy, Jan A, MD   325 mg at 10/18/21 1126   guaiFENesin-dextromethorphan (ROBITUSSIN DM) 100-10 MG/5ML syrup 10 mL  10 mL Oral Q4H PRN Mansy, Jan A, MD       heparin injection 5,000 Units  5,000 Units Subcutaneous Q8H Mansy, Jan A, MD   5,000 Units at 10/18/21 5427   HYDROcodone-acetaminophen (NORCO/VICODIN) 5-325 MG per tablet 1 tablet  1 tablet Oral Q6H PRN Mansy, Jan A, MD       insulin aspart (novoLOG) injection 0-5 Units  0-5 Units Subcutaneous QHS Donne Hazel, MD       insulin aspart (novoLOG) injection 0-9 Units  0-9 Units Subcutaneous TID WC Donne Hazel, MD   1 Units at 10/18/21 0623   magnesium hydroxide (MILK OF MAGNESIA) suspension 30 mL  30 mL Oral Daily PRN Mansy, Jan A, MD       methylPREDNISolone sodium succinate (SOLU-MEDROL) 125 mg/2 mL injection 62.5 mg  1 mg/kg Intravenous Q12H Mansy, Jan A, MD   62.5 mg at 10/18/21 1128   Followed by   Derrill Memo ON 10/20/2021] predniSONE (DELTASONE) tablet 50 mg  50 mg Oral Daily Mansy, Jan A, MD       multivitamin with minerals tablet 1 tablet  1 tablet Oral Daily Mansy, Jan A, MD   1 tablet at 10/17/21 1028   ondansetron (ZOFRAN) tablet 4 mg  4 mg Oral Q6H PRN Mansy, Jan A, MD       Or   ondansetron [facility]) injection 4 mg  4 mg Intravenous Q6H PRN Mansy, Jan A, MD       remdesivir 100 mg in sodium chloride 0.9 % 100 mL IVPB  100 mg Intravenous Daily Renda Rolls, RPH 200 mL/hr at 10/18/21 1132 100 mg at 10/18/21 1132   sodium bicarbonate tablet 1,300 mg  1,300 mg Oral BID Mansy, Jan A, MD   1,300 mg at 10/18/21 1127  torsemide (DEMADEX) tablet 10 mg  10 mg Oral Daily Mansy, Jan A, MD    10 mg at 10/18/21 1126   traZODone (DESYREL) tablet 25 mg  25 mg Oral QHS PRN Mansy, Jan A, MD       zinc sulfate capsule 220 mg  220 mg Oral Daily Mansy, Jan A, MD   220 mg at 10/18/21 1128      Allergies: Allergies  Allergen Reactions   Mircera [Methoxy Polyethylene Glycol-Epoetin Beta]       Past Medical History: Past Medical History:  Diagnosis Date   Anemia    Diabetes mellitus without complication ([place])    Gall stone    Gout    Hyperlipidemia    Hypertension    Renal disorder    kidney disease     Past Surgical History: Past Surgical History:  Procedure Laterality Date   A/V SHUNTOGRAM Left 02/16/2020   Procedure: A/V SHUNTOGRAM;  Surgeon: Algernon Huxley, MD;  Location: [place];  Service: Cardiovascular;  Laterality: Left;   AV FISTULA PLACEMENT Left 01/15/2020   Procedure: INSERTION OF ARTERIOVENOUS (AV) GORE-TEX GRAFT ARM;  Surgeon: Algernon Huxley, MD;  Location: [facility] ORS;  Service: Vascular;  Laterality: Left;   COLONOSCOPY WITH PROPOFOL N/A 01/27/2016   Procedure: COLONOSCOPY WITH PROPOFOL;  Surgeon: Hulen Luster, MD;  Location: [facility] ENDOSCOPY;  Service: Gastroenterology;  Laterality: N/A;   TUBAL LIGATION       Family History: Family History  Problem Relation Age of Onset   Breast cancer Sister 81     Social History: Social History   Socioeconomic History   Marital status: Married    Spouse name: Not on file   Number of children: Not on file   Years of education: Not on file   Highest education level: Not on file  Occupational History   Not on file  Tobacco Use   Smoking status: Former   Smokeless tobacco: Never  Vaping Use   Vaping Use: Never used  Substance and Sexual Activity   Alcohol use: No   Drug use: No   Sexual activity: Not on file  Other Topics Concern   Not on file  Social History Narrative   Not on file   Social Determinants of Health   Financial Resource Strain: Not on file  Food Insecurity: Not on file   Transportation Needs: Not on file  Physical Activity: Not on file  Stress: Not on file  Social Connections: Not on file  Intimate Partner Violence: Not on file     Review of Systems: Review of Systems  Constitutional:  Positive for malaise/fatigue. Negative for chills and fever.  HENT:  Negative for congestion, sore throat and tinnitus.   Eyes:  Negative for blurred vision and redness.  Respiratory:  Negative for cough, shortness of breath and wheezing.   Cardiovascular:  Negative for chest pain, palpitations, claudication and leg swelling.  Gastrointestinal:  Negative for abdominal pain, blood in stool, diarrhea, nausea and vomiting.  Genitourinary:  Negative for flank pain, frequency and hematuria.  Musculoskeletal:  Negative for back pain, falls and myalgias.  Skin:  Negative for rash.  Neurological:  Negative for dizziness, weakness and headaches.  Endo/Heme/Allergies:  Does not bruise/bleed easily.  Psychiatric/Behavioral:  Negative for depression. The patient is not nervous/anxious and does not have insomnia.    Vital Signs: Blood pressure (!) 99/59, pulse 71, temperature 98.5 F (36.9 C), temperature source Oral, resp. rate 19, height 5' 2.01" (1.575  m), weight 63.6 kg, SpO2 94 %.  Weight trends: Filed Weights   10/16/21 1413 10/16/21 2238 10/18/21 0100  Weight: 63 kg 62.4 kg 63.6 kg    Physical Exam: General: NAD, sitting up in bed  Head: Normocephalic, atraumatic. Moist oral mucosal membranes  Eyes: Anicteric  Lungs:  Clear to auscultation, normal effort  Heart: Regular rate and rhythm  Abdomen:  Soft, nontender, nondistended  Extremities: Note peripheral edema.  Neurologic: Nonfocal, moving all four extremities  Skin: No lesions  Access: Left AVG     Lab results: Basic Metabolic Panel: Recent Labs  Lab 10/16/21 1419 10/16/21 2356 10/17/21 0500 10/18/21 0538  NA 136  --  138 138  K 4.4  --  5.1 4.8  CL 105  --  106 104  CO2 23  --  24 22  GLUCOSE  88  --  96 125*  BUN 25*  --  37* 65*  CREATININE 5.27* 5.98* 6.11* 6.62*  CALCIUM 8.1*  --  8.1* 7.8*    Liver Function Tests: Recent Labs  Lab 10/16/21 1419 10/17/21 0500 10/18/21 0538  AST 44* 40 35  ALT 18 16 15   ALKPHOS 67 67 68  BILITOT 0.8 0.9 0.7  PROT 6.7 6.3* 6.3*  ALBUMIN 2.8* 2.7* 2.7*   No results for input(s): LIPASE, AMYLASE in the last 168 hours. No results for input(s): AMMONIA in the last 168 hours.  CBC: Recent Labs  Lab 10/16/21 1419 10/16/21 2356 10/17/21 0500 10/18/21 0538  WBC 6.7 5.1 4.5 7.4  NEUTROABS  --   --  3.9 6.1  HGB 10.7* 10.2* 10.1* 10.1*  HCT 37.0 34.6* 34.8* 32.6*  MCV 106.6* 107.1* 106.7* 101.6*  PLT 98* 88* 90* 125*    Cardiac Enzymes: No results for input(s): CKTOTAL, CKMB, CKMBINDEX, TROPONINI in the last 168 hours.  BNP: Invalid input(s): POCBNP  CBG: Recent Labs  Lab 10/17/21 1142 10/17/21 2334 10/18/21 0727 10/18/21 1124  GLUCAP 91 146* 148* 159*    Microbiology: Results for orders placed or performed during the hospital encounter of 10/16/21  Resp Panel by RT-PCR (Flu A&B, Covid) Nasopharyngeal Swab     Status: Abnormal   Collection Time: 10/16/21  2:19 PM   Specimen: Nasopharyngeal Swab; Nasopharyngeal(NP) swabs in vial transport medium  Result Value Ref Range Status   SARS Coronavirus 2 by RT PCR POSITIVE (A) NEGATIVE Final    Comment: (NOTE) SARS-CoV-2 target nucleic acids are DETECTED.  The SARS-CoV-2 RNA is generally detectable in upper respiratory specimens during the acute phase of infection. Positive results are indicative of the presence of the identified virus, but do not rule out bacterial infection or co-infection with other pathogens not detected by the test. Clinical correlation with patient history and other diagnostic information is necessary to determine patient infection status. The expected result is Negative.  Fact Sheet for  Patients: EntrepreneurPulse.com.au  Fact Sheet for Healthcare Providers: IncredibleEmployment.be  This test is not yet approved or cleared by the [place] FDA and  has been authorized for detection and/or diagnosis of SARS-CoV-2 by FDA under an Emergency Use Authorization (EUA).  This EUA will remain in effect (meaning this test can be used) for the duration of  the COVID-19 declaration under Section 564(b)(1) of the A ct, 21 U.S.C. section 360bbb-3(b)(1), unless the authorization is terminated or revoked sooner.     Influenza A by PCR NEGATIVE NEGATIVE Final   Influenza B by PCR NEGATIVE NEGATIVE Final    Comment: (NOTE) The Xpert  Xpress SARS-CoV-2/FLU/RSV plus assay is intended as an aid in the diagnosis of influenza from Nasopharyngeal swab specimens and should not be used as a sole basis for treatment. Nasal washings and aspirates are unacceptable for Xpert Xpress SARS-CoV-2/FLU/RSV testing.  Fact Sheet for Patients: EntrepreneurPulse.com.au  Fact Sheet for Healthcare Providers: IncredibleEmployment.be  This test is not yet approved or cleared by the [place] FDA and has been authorized for detection and/or diagnosis of SARS-CoV-2 by FDA under an Emergency Use Authorization (EUA). This EUA will remain in effect (meaning this test can be used) for the duration of the COVID-19 declaration under Section 564(b)(1) of the Act, 21 U.S.C. section 360bbb-3(b)(1), unless the authorization is terminated or revoked.  Performed at [facility], [address] [place], [place] [address]   Expectorated Sputum Assessment w Gram Stain, Rflx to Resp Cult     Status: None   Collection Time: 10/17/21  5:00 AM   Specimen: Sputum  Result Value Ref Range Status   Specimen Description   Final    SPUTUM Performed at [facility], [address] [place], [place] [address]    Special  Requests   Final    NONE Performed at [facility], [address] [place], [place] [address]    Sputum evaluation   Final    Sputum specimen not acceptable for testing.  Please recollect.   Results Called to: N. POLIUM 10/17/21 @ 1603 FH Performed at [facility], [place] [address] [place], [place] [address]    Report Status 10/17/2021 FINAL  Final  MRSA Next Gen by PCR, Nasal     Status: None   Collection Time: 10/18/21  5:22 AM   Specimen: Nasal Mucosa; Nasal Swab  Result Value Ref Range Status   MRSA by PCR Next Gen NOT DETECTED NOT DETECTED Final    Comment: (NOTE) The GeneXpert MRSA Assay (FDA approved for NASAL specimens only), is one component of a comprehensive MRSA colonization surveillance program. It is not intended to diagnose MRSA infection nor to guide or monitor treatment for MRSA infections. Test performance is not FDA approved in patients less than 26 years old. Performed at [facility], [address] [place], [place] [address]     Coagulation Studies: No results for input(s): LABPROT, INR in the last 72 hours.  Urinalysis: No results for input(s): COLORURINE, LABSPEC, PHURINE, GLUCOSEU, HGBUR, BILIRUBINUR, KETONESUR, PROTEINUR, UROBILINOGEN, NITRITE, LEUKOCYTESUR in the last 72 hours.  Invalid input(s): APPERANCEUR    Imaging: DG Chest 2 View  Result Date: 10/16/2021 CLINICAL DATA:  COVID. Decreased appetite for 3 days. Weakness. Dialysis patient. EXAM: CHEST - 2 VIEW COMPARISON:  None. FINDINGS: The heart is enlarged. Diffuse interstitial prominence noted. This is greatest at the lung bases. No significant airspace consolidation is present. Fused anterior osteophytes are present within the thoracic spine. IMPRESSION: 1. Cardiomegaly with diffuse interstitial prominence. While this may represent edema, viral infection is also considered. Electronically Signed   By: San Morelle M.D.   On: 10/16/2021 20:24     Assessment &  Plan: Krista Gordon is a 81 y.o.  female with a past medical history including dyslipidemia, hypertension, diabetes, and end-stage renal disease on hemodialysis, who was admitted to [facility] on 10/16/2021 for Thrombocytopenia ([place]) [D69.6] Hypotension, unspecified hypotension type [I95.9] Pneumonia due to COVID-19 virus [U07.1, J12.82] COVID-19 [U07.1]  [facility]/TTS/left AVG/64 kg  1.  End-stage renal disease on hemodialysis.  Will maintain outpatient schedule, if  possible.  Plan to dialyze patient later today on COVID shift.  Dialysis coordinator aware of patient and COVID status.  We will make appropriate arrangements for outpatient treatment at discharge.  Next scheduled treatment on Thursday.  2 Anemia of chronic kidney disease Lab Results  Component Value Date   HGB 10.1 (L) 10/18/2021  Epogen and Mircera given outpatient. Hemoglobin within acceptable range.  We will continue to monitor  3. Secondary Hyperparathyroidism: with outpatient labs: PTH 345, phosphorus 3.7, calcium 9.0 on 09/27/21.   Lab Results  Component Value Date   CALCIUM 7.8 (L) 10/18/2021   CAION 1.13 (L) 01/15/2020    Calcium within acceptable range.  We will obtain updated phosphorus in a.m.  4.  Hypertension with chronic kidney disease.  Home regimen includes amlodipine, quinapril, and torsemide.  All currently held at this time.  BP currently 99/59.  5. 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 Home regimen includes glipizide and Tradjenta, currently held. Most recent hemoglobin A1c is 4.3 on 10/17/21.   6.  COVID-19 pneumonia.  Currently receiving remdesivir and steroid therapy.     LOS: 2 [place] 1/3/20231:08 PM

## 2021-10-18 NOTE — Progress Notes (Signed)
HIPAA compliant voicemail left for daughter Mariann Laster @ [phone] 10/18/21

## 2021-10-18 NOTE — Progress Notes (Signed)
Initial Nutrition Assessment  DOCUMENTATION CODES:   Not applicable  INTERVENTION:   Nepro Shake po TID, each supplement provides 425 kcal and 19 grams protein  Rena-vit po daily   Liberalize diet   Pt at refeed risk; recommend monitor potassium, magnesium and phosphorus labs daily until stable  NUTRITION DIAGNOSIS:   Increased nutrient needs related to chronic illness (ESRD on HD, COVID 19) as evidenced by estimated needs.  GOAL:   Patient will meet greater than or equal to 90% of their needs  MONITOR:   PO intake, Supplement acceptance, Labs, Weight trends, Skin, I & O's  REASON FOR ASSESSMENT:   Malnutrition Screening Tool    ASSESSMENT:   81 y/o female with h/o ESRD in HD, DM, HTN, HLD, CKD III and gout who is admitted with COVID 19.  Met with pt in room today. Pt reports poor appetite and oral intake at baseline. Pt reports that her oral intake is poor in hospital. Pt ate 25% of her lunch today. Pt reports that she enjoys Nepro and drinks this during dialysis. RD will add supplements and vitamins and vitamins to help pt meet her estimated needs. RD will also liberalize pt's diet. Pt is likely at refeed risk. Per chart, pt appears weight stable at baseline.   Medications reviewed and include: vitamin C, aspirin, D3, pepcid, ferrous sulfate, heparin, insulin, solu-medrol, MVI, Na bicarbonate, torsemide, zinc  Labs reviewed: K 4.8 wnl, BUN 65(H), creat 6.62(H) Hgb 10.1(L), Hct 32.6(L) Cbgs- 159, 148 x 24 hrs  NUTRITION - FOCUSED PHYSICAL EXAM:  Flowsheet Row Most Recent Value  Orbital Region No depletion  Upper Arm Region Mild depletion  Thoracic and Lumbar Region No depletion  Buccal Region No depletion  Temple Region No depletion  Clavicle Bone Region No depletion  Clavicle and Acromion Bone Region No depletion  Scapular Bone Region No depletion  Dorsal Hand Mild depletion  Patellar Region Moderate depletion  Anterior Thigh Region Moderate depletion   Posterior Calf Region Moderate depletion  Edema (RD Assessment) None  Hair Reviewed  Eyes Reviewed  Mouth Reviewed  Skin Reviewed  Nails Reviewed   Diet Order:   Diet Order             Diet renal with fluid restriction Fluid restriction: 1200 mL Fluid; Room service appropriate? Yes; Fluid consistency: Thin  Diet effective now                  EDUCATION NEEDS:   Education needs have been addressed  Skin:  Skin Assessment: Reviewed RN Assessment (skin tear to left thigh)  Last BM:  1/2- TYPE 7  Height:   Ht Readings from Last 1 Encounters:  10/18/21 5' 2.01" (1.575 m)    Weight:   Wt Readings from Last 1 Encounters:  10/18/21 63.6 kg    Ideal Body Weight:  50 kg  BMI:  Body mass index is 25.64 kg/m.  Estimated Nutritional Needs:   Kcal:  1600-1800kcal/day  Protein:  80-90g/day  Fluid:  UOP +1L  Koleen Distance MS, RD, LDN Please refer to [facility] for RD and/or RD on-call/weekend/after hours pager

## 2021-10-18 NOTE — Consult Note (Signed)
[place] Nurse Consult Note: Patient receiving care in [facility]. Patient is Covid +. Consult completed remotely after review of record and image of wound. Reason for Consult: skin tear to left thigh Wound type: presumed trauma injury to site. Pressure Injury POA: Yes/No/NA Measurement: see flowsheet Wound bed: see photo Drainage (amount, consistency, odor)  Periwound: intact Dressing procedure/placement/frequency: Wash left thigh wound with soap and water, pat dry. Apply Mupirocin into the wound, cover with a small foam dressing. Perform twice daily.  [place] nurse will not follow at this time.  Please re-consult the [place] team if needed.  Val Riles, RN, MSN, CWOCN, CNS-BC, pager [phone]

## 2021-10-18 NOTE — Progress Notes (Signed)
Pt complained of being hot and is sweaty. Blood sugar was checked and is 133 . BP checked and it dropped to 73/55. UF off and UF goal decreased to 0.5L net. Then Flushed 250 cc NSS. Repeat  bp -113/51 and pt verbalized feeling better.

## 2021-10-19 LAB — GLUCOSE, CAPILLARY
Glucose-Capillary: 116 mg/dL — ABNORMAL HIGH (ref 70–99)
Glucose-Capillary: 189 mg/dL — ABNORMAL HIGH (ref 70–99)
Glucose-Capillary: 210 mg/dL — ABNORMAL HIGH (ref 70–99)
Glucose-Capillary: 146 mg/dL — ABNORMAL HIGH (ref 70–99)

## 2021-10-19 LAB — COMPREHENSIVE METABOLIC PANEL
ALT: 13 U/L (ref 0–44)
AST: 29 U/L (ref 15–41)
Albumin: 2.4 g/dL — ABNORMAL LOW (ref 3.5–5.0)
Alkaline Phosphatase: 57 U/L (ref 38–126)
Anion gap: 9 (ref 5–15)
BUN: 36 mg/dL — ABNORMAL HIGH (ref 8–23)
CO2: 28 mmol/L (ref 22–32)
Calcium: 7.5 mg/dL — ABNORMAL LOW (ref 8.9–10.3)
Chloride: 97 mmol/L — ABNORMAL LOW (ref 98–111)
Creatinine, Ser: 3.64 mg/dL — ABNORMAL HIGH (ref 0.44–1.00)
GFR, Estimated: 12 mL/min — ABNORMAL LOW (ref >60)
Glucose, Bld: 103 mg/dL — ABNORMAL HIGH (ref 70–99)
Potassium: 3.7 mmol/L (ref 3.5–5.1)
Sodium: 134 mmol/L — ABNORMAL LOW (ref 135–145)
Total Bilirubin: 0.9 mg/dL (ref 0.3–1.2)
Total Protein: 5.9 g/dL — ABNORMAL LOW (ref 6.5–8.1)

## 2021-10-19 LAB — CBC WITH DIFFERENTIAL/PLATELET
Abs Immature Granulocytes: 0.11 10*3/uL — ABNORMAL HIGH (ref 0.00–0.07)
Basophils Absolute: 0 10*3/uL (ref 0.0–0.1)
Basophils Relative: 0 %
Eosinophils Absolute: 0 10*3/uL (ref 0.0–0.5)
Eosinophils Relative: 0 %
HCT: 28.8 % — ABNORMAL LOW (ref 36.0–46.0)
Hemoglobin: 9.2 g/dL — ABNORMAL LOW (ref 12.0–15.0)
Immature Granulocytes: 1 %
Lymphocytes Relative: 9 %
Lymphs Abs: 0.8 10*3/uL (ref 0.7–4.0)
MCH: 30.7 pg (ref 26.0–34.0)
MCHC: 31.9 g/dL (ref 30.0–36.0)
MCV: 96 fL (ref 80.0–100.0)
Monocytes Absolute: 0.4 10*3/uL (ref 0.1–1.0)
Monocytes Relative: 4 %
Neutro Abs: 7.2 10*3/uL (ref 1.7–7.7)
Neutrophils Relative %: 86 %
Platelets: 124 10*3/uL — ABNORMAL LOW (ref 150–400)
RBC: 3 MIL/uL — ABNORMAL LOW (ref 3.87–5.11)
RDW: 13.2 % (ref 11.5–15.5)
WBC: 8.4 10*3/uL (ref 4.0–10.5)
nRBC: 0 % (ref 0.0–0.2)

## 2021-10-19 LAB — D-DIMER, QUANTITATIVE: D-Dimer, Quant: 2.63 ug/mL-FEU — ABNORMAL HIGH (ref 0.00–0.50)

## 2021-10-19 LAB — C-REACTIVE PROTEIN: CRP: 4.2 mg/dL — ABNORMAL HIGH (ref <1.0)

## 2021-10-19 LAB — FERRITIN: Ferritin: 1995 ng/mL — ABNORMAL HIGH (ref 11–307)

## 2021-10-19 LAB — PHOSPHORUS: Phosphorus: 3.7 mg/dL (ref 2.5–4.6)

## 2021-10-19 NOTE — Evaluation (Signed)
Physical Therapy Evaluation Patient Details Name: Krista Gordon MRN: [id number] DOB: January 26, 1941 Today's Date: 10/19/2021  History of Present Illness  Krista Gordon is a 22yoF who [facility] after 3 days annorexia and weakness. Pt is COVID (+) here. PMH: DM2, HTN, ESRD on HD TTS. Pt admitted with multifocal PNA d/t COVID19.  Clinical Impression  Pt admitted with above diagnosis. Pt currently with functional limitations due to the deficits listed below (see "PT Problem List"). Patient agreeable to PT evaluation. Patient provides detailed description of PLOF and home environment. Patient's assessment this date reveals the patient requires additional time and effort to complete their typical ADL. At baseline, the patient is able to perform ADL with modified independence. Patient is near baseline, all education completed, and time is given to address all questions/concerns. No additional skilled PT services needed at this time, PT signing off. PT recommends daily ambulation ad lib or with nursing staff as needed to prevent deconditioning.         Recommendations for follow up therapy are one component of a multi-disciplinary discharge planning process, led by the attending physician.  Recommendations may be updated based on patient status, additional functional criteria and insurance authorization.  Follow Up Recommendations No PT follow up    Assistance Recommended at Discharge None  Patient can return home with the following       Equipment Recommendations None recommended by PT  Recommendations for Other Services       Functional Status Assessment Patient has had a recent decline in their functional status and demonstrates the ability to make significant improvements in function in a reasonable and predictable amount of time.     Precautions / Restrictions Precautions Precautions: Fall Restrictions Weight Bearing Restrictions: No      Mobility  Bed Mobility Overal bed  mobility: Modified Independent             General bed mobility comments: author managing tele leads    Transfers Overall transfer level: Modified independent                 General transfer comment: Chief Strategy Officer managing tele leads    Ambulation/Gait Ambulation/Gait assistance: Modified independent (Device/Increase time) Gait Distance (Feet): 120 Feet Assistive device: None Gait Pattern/deviations: WFL(Within Functional Limits)       General Gait Details: Art gallery manager tele leads  Stairs            Wheelchair Mobility    Modified Rankin (Stroke Patients Only)       Balance                                             Pertinent Vitals/Pain Pain Assessment: No/denies pain    Home Living Family/patient expects to be discharged to:: Private residence Living Arrangements: Children Available Help at Discharge: Family;Available 24 hours/day Type of Home: House Home Access: Stairs to enter Entrance Stairs-Rails: None Entrance Stairs-Number of Steps: 4   Home Layout: One level Home Equipment: Conservation officer, nature (2 wheels)      Prior Function Prior Level of Function : Independent/Modified Independent               ADLs Comments: independent     Hand Dominance        Extremity/Trunk Assessment   Upper Extremity Assessment Upper Extremity Assessment: Overall WFL for tasks assessed    Lower  Extremity Assessment Lower Extremity Assessment: Overall WFL for tasks assessed       Communication      Cognition Arousal/Alertness: Awake/alert Behavior During Therapy: WFL for tasks assessed/performed Overall Cognitive Status: Within Functional Limits for tasks assessed                                          General Comments      Exercises     Assessment/Plan    PT Assessment All further PT needs can be met in the next venue of care  PT Problem List Decreased strength;Decreased activity  tolerance;Decreased mobility       PT Treatment Interventions      PT Goals (Current goals can be found in the Care Plan section)  Acute Rehab PT Goals PT Goal Formulation: All assessment and education complete, DC therapy    Frequency       Co-evaluation               AM-PAC PT "6 Clicks" Mobility  Outcome Measure Help needed turning from your back to your side while in a flat bed without using bedrails?: None Help needed moving from lying on your back to sitting on the side of a flat bed without using bedrails?: None Help needed moving to and from a bed to a chair (including a wheelchair)?: None Help needed standing up from a chair using your arms (e.g., wheelchair or bedside chair)?: None Help needed to walk in hospital room?: None Help needed climbing 3-5 steps with a railing? : A Little 6 Click Score: 23    End of Session   Activity Tolerance: Patient tolerated treatment well;No increased pain Patient left: in bed;with call bell/phone within reach Nurse Communication: Mobility status PT Visit Diagnosis: Muscle weakness (generalized) (M62.81)    Time: 8208-1388 PT Time Calculation (min) (ACUTE ONLY): 14 min   Charges:   PT Evaluation $PT Eval Low Complexity: 1 Low         1:53 PM, 10/19/21 Etta Grandchild, PT, DPT Physical Therapist - [facility]  [phone] ([place])    [place] 10/19/2021, 1:51 PM

## 2021-10-19 NOTE — Progress Notes (Signed)
PROGRESS NOTE    Krista Gordon  GQQ:[id number] DOB: 02/21/1941 DOA: 10/16/2021 PCP: Marguerita Merles, MD    Brief Narrative:  81 y.o. 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 intake.  She has been having cough productive of whitish sputum over the last 3 days without dyspnea or fever or chills.  She denies any nausea or vomiting or diarrhea.  No loss of taste or smell.  No chest pain or palpitations. Pt was found to be covid pos.  10/19/2021: Mild dyspnea with exertion.  We will have PT OT assess for DC planning.  Assessment & Plan:   Principal Problem:   Pneumonia due to COVID-19 virus    1.  Multifocal pneumonia secondary to COVID-19. -Given multifocal pneumonia pt was started on empiric IV Rocephin and Zithromax for possible bacterial superinfection only with elevated Procalcitonin of over 0.7 Personally reviewed chest x-ray done on admission which shows bilateral pulmonary infiltrates. -cont bronchodilator as needed Continue IV Remdesivir and IV steroid therapy with IV Solu-Medrol. Continue vitamin D3, vitamin C, zinc sulfate, p.o. Pepcid and aspirin.  2.  End-stage renal disease on hemodialysis TTS. - Nephrology consulted. Last HD on 10/18/2021.  3.  Dyslipidemia. -  statin therapy. -LFT's stable  4.  Hypertension - We will continue her antihypertensives. -BP stable  5.  Type 2 diabetes mellitus. 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  Physical debility PT OT to assess Follow-up precautions.    DVT prophylaxis: Heparin subq 3 times daily Code Status: DNR Family Communication: Pt in  room, family not at bedside  Status is: Inpatient  Remains inpatient appropriate because: severity of illness     Consultants:  Nephrology  Procedures:    Antimicrobials: Anti-infectives (From admission, onward)    Start     Dose/Rate Route Frequency Ordered Stop   10/17/21 1000  remdesivir 100 mg in sodium chloride 0.9 % 100 mL IVPB       See Hyperspace for full Linked Orders Report.   100 mg 200 mL/hr over 30 Minutes Intravenous Daily 10/16/21 2304 10/21/21 0959   10/17/21 1000  remdesivir 100 mg in sodium chloride 0.9 % 100 mL IVPB  Status:  Discontinued       See Hyperspace for full Linked Orders Report.   100 mg 200 mL/hr over 30 Minutes Intravenous Daily 10/16/21 2348 10/16/21 2355   10/17/21 0000  remdesivir 200 mg in sodium chloride 0.9% 250 mL IVPB       See Hyperspace for full Linked Orders Report.   200 mg 580 mL/hr over 30 Minutes Intravenous Once 10/16/21 2304 10/17/21 0132   10/17/21 0000  remdesivir 200 mg in sodium chloride 0.9% 250 mL IVPB  Status:  Discontinued       See Hyperspace for full Linked Orders Report.   200 mg 580 mL/hr over 30 Minutes Intravenous Once 10/16/21 2348 10/16/21 2355        Objective: Vitals:   10/18/21 2158 10/18/21 2232 10/19/21 0418 10/19/21 0820  BP: (!) 97/48 (!) 114/51 108/62 (!) 114/58  Pulse: 63 61 74 69  Resp: 17 (!) 22 18 18   Temp: 97.7 F (36.5 C)  97.8 F (36.6 C) 98.4 F (36.9 C) 98.4 F (36.9 C)  TempSrc: Oral Oral Oral Oral  SpO2: 96% 96% 93% 95%  Weight: 63.1 kg     Height:        Intake/Output Summary (Last 24 hours) at 10/19/2021 1238 Last data filed at 10/18/2021 2144 Gross per 24 hour  Intake 200 ml  Output -351 ml  Net 551 ml   Filed Weights   10/18/21 0100 10/18/21 1855 10/18/21 2158  Weight: 63.6 kg 60.4 kg 63.1 kg    Examination: General exam: Well-developed well-nourished in no acute distress.  She is alert and oriented x3.   Respiratory system: Mild rales at bases mild wheezing noted.   Poor respiratory effort. Cardiovascular system: Regular rate and rhythm no rubs or gallops.   Gastrointestinal system: Nontender nondistended normal bowel sounds present. Central nervous system: Nonfocal exam.  Moves all 4 extremities. Extremities: No deformities or cyanosis. Skin: No rashes.  No ulcerative lesions. Psychiatry: Mood is appropriate for condition and setting.  Data Reviewed: I have personally reviewed following labs and imaging studies  CBC: Recent Labs  Lab 10/16/21 1419 10/16/21 2356 10/17/21 0500 10/18/21 0538 10/19/21 0409  WBC 6.7 5.1 4.5 7.4 8.4  NEUTROABS  --   --  3.9 6.1 7.2  HGB 10.7* 10.2* 10.1* 10.1* 9.2*  HCT 37.0 34.6* 34.8* 32.6* 28.8*  MCV 106.6* 107.1* 106.7* 101.6* 96.0  PLT 98* 88* 90* 125* 144*   Basic Metabolic Panel: Recent Labs  Lab 10/16/21 1419 10/16/21 2356 10/17/21 0500 10/18/21 0538 10/19/21 0409  NA 136  --  138 138 134*  K 4.4  --  5.1 4.8 3.7  CL 105  --  106 104 97*  CO2 23  --  24 22 28   GLUCOSE 88  --  96 125* 103*  BUN 25*  --  37* 65* 36*  CREATININE 5.27* 5.98* 6.11* 6.62* 3.64*  CALCIUM 8.1*  --  8.1* 7.8* 7.5*  PHOS  --   --   --   --  3.7   GFR: Estimated Creatinine Clearance: 11.1 mL/min (A) (by C-G formula based on SCr of 3.64 mg/dL (H)). Liver Function Tests: Recent Labs  Lab 10/16/21 1419 10/17/21 0500 10/18/21 0538 10/19/21 0409  AST 44* 40 35 29  ALT 18 16 15 13   ALKPHOS 67 67 68 57  BILITOT 0.8 0.9 0.7 0.9  PROT 6.7 6.3* 6.3* 5.9*  ALBUMIN 2.8* 2.7* 2.7* 2.4*   No results for input(s): LIPASE, AMYLASE in the last 168 hours. No results for input(s): AMMONIA in the last 168 hours. Coagulation Profile: No results for input(s): INR, PROTIME in the last 168 hours. Cardiac Enzymes: No results for input(s): CKTOTAL, CKMB, CKMBINDEX, TROPONINI in the last 168 hours. BNP (last 3 results) No results for input(s): PROBNP in the last 8760 hours. HbA1C: Recent Labs    10/17/21 0511  HGBA1C 4.3*    CBG: Recent Labs  Lab 10/18/21 1626 10/18/21 2015 10/18/21 2235 10/19/21 0817 10/19/21 1143  GLUCAP 149* 133* 115* 116* 189*   Lipid Profile: No results for input(s): CHOL, HDL, LDLCALC, TRIG, CHOLHDL, LDLDIRECT in the last 72 hours. Thyroid Function Tests: No results for input(s): TSH, T4TOTAL, FREET4, T3FREE, THYROIDAB in the last 72 hours. Anemia Panel: Recent Labs    10/18/21 0538 10/19/21 0409  FERRITIN 1,912* 1,995*   Sepsis Labs: Recent Labs  Lab 10/16/21 2356  PROCALCITON 0.74    Recent Results (from the past 240 hour(s))  Resp  Panel by RT-PCR (Flu A&B, Covid) Nasopharyngeal Swab     Status: Abnormal   Collection Time: 10/16/21  2:19 PM   Specimen: Nasopharyngeal Swab; Nasopharyngeal(NP) swabs in vial transport medium  Result Value Ref Range Status   SARS Coronavirus 2 by RT PCR POSITIVE (A) NEGATIVE Final    Comment: (NOTE) SARS-CoV-2 target nucleic acids are DETECTED.  The SARS-CoV-2 RNA is generally detectable in upper respiratory specimens during the acute phase of infection. Positive results are indicative of the presence of the identified virus, but do not rule out bacterial infection or co-infection with other pathogens not detected by the test. Clinical correlation with patient history and other diagnostic information is necessary to determine patient infection status. The expected result is Negative.  Fact Sheet for Patients: EntrepreneurPulse.com.au  Fact Sheet for Healthcare Providers: IncredibleEmployment.be  This test is not yet approved or cleared by the [place] FDA and  has been authorized for detection and/or diagnosis of SARS-CoV-2 by FDA under an Emergency Use Authorization (EUA).  This EUA will remain in effect (meaning this test can be used) for the duration of  the COVID-19 declaration under Section 564(b)(1) of the A ct, 21 U.S.C. section 360bbb-3(b)(1), unless the authorization  is terminated or revoked sooner.     Influenza A by PCR NEGATIVE NEGATIVE Final   Influenza B by PCR NEGATIVE NEGATIVE Final    Comment: (NOTE) The Xpert Xpress SARS-CoV-2/FLU/RSV plus assay is intended as an aid in the diagnosis of influenza from Nasopharyngeal swab specimens and should not be used as a sole basis for treatment. Nasal washings and aspirates are unacceptable for Xpert Xpress SARS-CoV-2/FLU/RSV testing.  Fact Sheet for Patients: EntrepreneurPulse.com.au  Fact Sheet for Healthcare Providers: IncredibleEmployment.be  This test is not yet approved or cleared by the [place] FDA and has been authorized for detection and/or diagnosis of SARS-CoV-2 by FDA under an Emergency Use Authorization (EUA). This EUA will remain in effect (meaning this test can be used) for the duration of the COVID-19 declaration under Section 564(b)(1) of the Act, 21 U.S.C. section 360bbb-3(b)(1), unless the authorization is terminated or revoked.  Performed at [facility], [address] [place], [place] [address]   Expectorated Sputum Assessment w Gram Stain, Rflx to Resp Cult     Status: None   Collection Time: 10/17/21  5:00 AM   Specimen: Sputum  Result Value Ref Range Status   Specimen Description   Final    SPUTUM Performed at [facility], [address] [place], [place] [address]    Special Requests   Final    NONE Performed at [facility], [address] [place], [place] [address]    Sputum evaluation   Final    Sputum specimen not acceptable for testing.  Please recollect.   Results Called to: N. POLIUM 10/17/21 @ 1603 FH Performed at [facility], [place] [address], [place], [place] [address]    Report Status 10/17/2021 FINAL  Final  MRSA Next Gen by PCR, Nasal     Status: None   Collection Time: 10/18/21  5:22 AM   Specimen: Nasal Mucosa; Nasal Swab  Result Value Ref Range Status   MRSA by  PCR Next Gen NOT DETECTED NOT DETECTED Final    Comment: (NOTE) The GeneXpert MRSA Assay (FDA approved for NASAL specimens only), is one component of a comprehensive MRSA colonization surveillance program. It is not intended to diagnose MRSA infection nor to guide or monitor treatment for MRSA  infections. Test performance is not FDA approved in patients less than 76 years old. Performed at [facility], [address] [place], [place] [address]      Radiology Studies: No results found.  Scheduled Meds:  vitamin C  500 mg Oral Daily   aspirin EC  81 mg Oral QHS   atorvastatin  40 mg Oral QHS   calcitonin (salmon)  1 spray Alternating Nares QHS   Chlorhexidine Gluconate Cloth  6 each Topical Q0600   cholecalciferol  2,000 Units Oral Daily   colchicine  0.6 mg Oral QODAY   famotidine  10 mg Oral BID   feeding supplement (NEPRO CARB STEADY)  237 mL Oral TID BM   ferrous sulfate  325 mg Oral Daily   heparin  5,000 Units Subcutaneous Q8H   insulin aspart  0-5 Units Subcutaneous QHS   insulin aspart  0-9 Units Subcutaneous TID WC   methylPREDNISolone (SOLU-MEDROL) injection  1 mg/kg Intravenous Q12H   Followed by   Derrill Memo ON 10/20/2021] predniSONE  50 mg Oral Daily   multivitamin  1 tablet Oral QHS   mupirocin ointment   Topical BID   sodium bicarbonate  1,300 mg Oral BID   torsemide  10 mg Oral Daily   zinc sulfate  220 mg Oral Daily   Continuous Infusions:  remdesivir 100 mg in NS 100 mL 100 mg (10/19/21 0932)     LOS: 3 days   Kayleen Memos, MD [facility] Pager On Amion  If 7PM-7AM, please contact night-coverage 10/19/2021, 12:38 PM

## 2021-10-19 NOTE — Progress Notes (Signed)
[facility]  ROUNDING NOTE   Subjective:   Patient sitting up in bed Currently eating breakfast Denies pain and discomfort Tolerating meals without nausea and vomiting.  Dialysis received yesterday, tolerated fair   Objective:  Vital signs in last 24 hours:  Temp:  [97.7 F (36.5 C)-98.4 F (36.9 C)] 98.4 F (36.9 C) (01/04 0820) Pulse Rate:  [53-74] 69 (01/04 0820) Resp:  [17-23] 18 (01/04 0820) BP: (73-119)/(48-63) 114/58 (01/04 0820) SpO2:  [93 %-96 %] 95 % (01/04 0820) Weight:  [60.4 kg-63.1 kg] 63.1 kg (01/03 2158)  Weight change: -3.2 kg Filed Weights   10/18/21 0100 10/18/21 1855 10/18/21 2158  Weight: 63.6 kg 60.4 kg 63.1 kg    Intake/Output: I/O last 3 completed shifts: In: 200 [IV Piggyback:200] Out: -351    Intake/Output this shift:  No intake/output data recorded.  Physical Exam: General: NAD, resting in bed  Head: Normocephalic, atraumatic. Moist oral mucosal membranes  Eyes: Anicteric  Lungs:  Clear to auscultation, normal effort  Heart: Regular rate and rhythm  Abdomen:  Soft, nontender  Extremities: No peripheral edema.  Neurologic: Nonfocal, moving all four extremities  Skin: No lesions  Access: LUE AVG    Basic Metabolic Panel: Recent Labs  Lab 10/16/21 1419 10/16/21 2356 10/17/21 0500 10/18/21 0538 10/19/21 0409  NA 136  --  138 138 134*  K 4.4  --  5.1 4.8 3.7  CL 105  --  106 104 97*  CO2 23  --  24 22 28   GLUCOSE 88  --  96 125* 103*  BUN 25*  --  37* 65* 36*  CREATININE 5.27* 5.98* 6.11* 6.62* 3.64*  CALCIUM 8.1*  --  8.1* 7.8* 7.5*  PHOS  --   --   --   --  3.7    Liver Function Tests: Recent Labs  Lab 10/16/21 1419 10/17/21 0500 10/18/21 0538 10/19/21 0409  AST 44* 40 35 29  ALT 18 16 15 13   ALKPHOS 67 67 68 57  BILITOT 0.8 0.9 0.7 0.9  PROT 6.7 6.3* 6.3* 5.9*  ALBUMIN 2.8* 2.7* 2.7* 2.4*   No results for input(s): LIPASE, AMYLASE in the last 168 hours. No results for input(s): AMMONIA in the  last 168 hours.  CBC: Recent Labs  Lab 10/16/21 1419 10/16/21 2356 10/17/21 0500 10/18/21 0538 10/19/21 0409  WBC 6.7 5.1 4.5 7.4 8.4  NEUTROABS  --   --  3.9 6.1 7.2  HGB 10.7* 10.2* 10.1* 10.1* 9.2*  HCT 37.0 34.6* 34.8* 32.6* 28.8*  MCV 106.6* 107.1* 106.7* 101.6* 96.0  PLT 98* 88* 90* 125* 124*    Cardiac Enzymes: No results for input(s): CKTOTAL, CKMB, CKMBINDEX, TROPONINI in the last 168 hours.  BNP: Invalid input(s): POCBNP  CBG: Recent Labs  Lab 10/18/21 1626 10/18/21 2015 10/18/21 2235 10/19/21 0817 10/19/21 1143  GLUCAP 149* 133* 115* 116* 189*    Microbiology: Results for orders placed or performed during the hospital encounter of 10/16/21  Resp Panel by RT-PCR (Flu A&B, Covid) Nasopharyngeal Swab     Status: Abnormal   Collection Time: 10/16/21  2:19 PM   Specimen: Nasopharyngeal Swab; Nasopharyngeal(NP) swabs in vial transport medium  Result Value Ref Range Status   SARS Coronavirus 2 by RT PCR POSITIVE (A) NEGATIVE Final    Comment: (NOTE) SARS-CoV-2 target nucleic acids are DETECTED.  The SARS-CoV-2 RNA is generally detectable in upper respiratory specimens during the acute phase of infection. Positive results are indicative of the presence of  the identified virus, but do not rule out bacterial infection or co-infection with other pathogens not detected by the test. Clinical correlation with patient history and other diagnostic information is necessary to determine patient infection status. The expected result is Negative.  Fact Sheet for Patients: EntrepreneurPulse.com.au  Fact Sheet for Healthcare Providers: IncredibleEmployment.be  This test is not yet approved or cleared by the [place] FDA and  has been authorized for detection and/or diagnosis of SARS-CoV-2 by FDA under an Emergency Use Authorization (EUA).  This EUA will remain in effect (meaning this test can be used) for the duration of  the  COVID-19 declaration under Section 564(b)(1) of the A ct, 21 U.S.C. section 360bbb-3(b)(1), unless the authorization is terminated or revoked sooner.     Influenza A by PCR NEGATIVE NEGATIVE Final   Influenza B by PCR NEGATIVE NEGATIVE Final    Comment: (NOTE) The Xpert Xpress SARS-CoV-2/FLU/RSV plus assay is intended as an aid in the diagnosis of influenza from Nasopharyngeal swab specimens and should not be used as a sole basis for treatment. Nasal washings and aspirates are unacceptable for Xpert Xpress SARS-CoV-2/FLU/RSV testing.  Fact Sheet for Patients: EntrepreneurPulse.com.au  Fact Sheet for Healthcare Providers: IncredibleEmployment.be  This test is not yet approved or cleared by the [place] FDA and has been authorized for detection and/or diagnosis of SARS-CoV-2 by FDA under an Emergency Use Authorization (EUA). This EUA will remain in effect (meaning this test can be used) for the duration of the COVID-19 declaration under Section 564(b)(1) of the Act, 21 U.S.C. section 360bbb-3(b)(1), unless the authorization is terminated or revoked.  Performed at [facility], [address] [place], [place] [address]   Expectorated Sputum Assessment w Gram Stain, Rflx to Resp Cult     Status: None   Collection Time: 10/17/21  5:00 AM   Specimen: Sputum  Result Value Ref Range Status   Specimen Description   Final    SPUTUM Performed at [facility], [address], [place], [place] [address]    Special Requests   Final    NONE Performed at [facility], [address] [place], [place] [address]    Sputum evaluation   Final    Sputum specimen not acceptable for testing.  Please recollect.   Results Called to: N. POLIUM 10/17/21 @ 1603 FH Performed at [facility], [place] [address] [place], [place] [address]    Report Status 10/17/2021 FINAL  Final  MRSA Next Gen by PCR, Nasal     Status: None    Collection Time: 10/18/21  5:22 AM   Specimen: Nasal Mucosa; Nasal Swab  Result Value Ref Range Status   MRSA by PCR Next Gen NOT DETECTED NOT DETECTED Final    Comment: (NOTE) The GeneXpert MRSA Assay (FDA approved for NASAL specimens only), is one component of a comprehensive MRSA colonization surveillance program. It is not intended to diagnose MRSA infection nor to guide or monitor treatment for MRSA infections. Test performance is not FDA approved in patients less than 89 years old. Performed at [facility], [address] [place], [place] [address]     Coagulation Studies: No results for input(s): LABPROT, INR in the last 72 hours.  Urinalysis: No results for input(s): COLORURINE, LABSPEC, PHURINE, GLUCOSEU, HGBUR, BILIRUBINUR, KETONESUR, PROTEINUR, UROBILINOGEN, NITRITE, LEUKOCYTESUR in the last 72 hours.  Invalid input(s): APPERANCEUR    Imaging: No results found.   Medications:    remdesivir 100 mg in NS  100 mL 100 mg (10/19/21 0932)    vitamin C  500 mg Oral Daily   aspirin EC  81 mg Oral QHS   atorvastatin  40 mg Oral QHS   calcitonin (salmon)  1 spray Alternating Nares QHS   Chlorhexidine Gluconate Cloth  6 each Topical Q0600   cholecalciferol  2,000 Units Oral Daily   colchicine  0.6 mg Oral QODAY   famotidine  10 mg Oral BID   feeding supplement (NEPRO CARB STEADY)  237 mL Oral TID BM   ferrous sulfate  325 mg Oral Daily   heparin  5,000 Units Subcutaneous Q8H   insulin aspart  0-5 Units Subcutaneous QHS   insulin aspart  0-9 Units Subcutaneous TID WC   methylPREDNISolone (SOLU-MEDROL) injection  1 mg/kg Intravenous Q12H   Followed by   Derrill Memo ON 10/20/2021] predniSONE  50 mg Oral Daily   multivitamin  1 tablet Oral QHS   mupirocin ointment   Topical BID   sodium bicarbonate  1,300 mg Oral BID   torsemide  10 mg Oral Daily   zinc sulfate  220 mg Oral Daily   acetaminophen, albuterol, chlorpheniramine-HYDROcodone,  guaiFENesin-dextromethorphan, HYDROcodone-acetaminophen, magnesium hydroxide, ondansetron **OR** ondansetron (ZOFRAN) IV, traZODone  Assessment/ Plan:  Ms. RHEGAN TRUNNELL is a 81 y.o.  female with a past medical history including dyslipidemia, hypertension, diabetes, and end-stage renal disease on hemodialysis, who was admitted to [facility] on 10/16/2021 for Thrombocytopenia ([place]) [D69.6] Hypotension, unspecified hypotension type [I95.9] Pneumonia due to COVID-19 virus [U07.1, J12.82] COVID-19 [U07.1]   [facility]/TTS/left AVG/64 kg  1.  End-stage renal disease on hemodialysis.  Will maintain outpatient schedule, if possible.  Dialysis coordinator aware of patient and COVID status.  We will make appropriate arrangements for outpatient treatment at discharge.   -Received dialysis yesterday evening, experienced hypotension during her treatment and was given 250 mL normal saline bolus -Next scheduled treatment on Thursday with no UF   2 Anemia of chronic kidney disease Epogen and Mircera given outpatient. We will continue to monitor   3. Secondary Hyperparathyroidism: with outpatient labs: PTH 345, phosphorus 3.7, calcium 9.0 on 09/27/21.   Phosphorus 3.7 today Continue calcitonin, cholecalciferol and offering Nepro shakes between meals.   4.  Hypertension with chronic kidney disease.  Home regimen includes amlodipine, quinapril, and torsemide.  Torsemide restarted at 10 mg daily.  May consider dosing this on nondialysis days.  BP 114/58   5. 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 Home regimen includes glipizide and Tradjenta, currently held. Most recent hemoglobin A1c is 4.3 on 10/17/21.    6.  COVID-19 pneumonia.  Continue remdesivir and steroid therapy.    LOS: [place] 1/4/202312:18 PM

## 2021-10-19 NOTE — Progress Notes (Signed)
OT Cancellation Note  Patient Details Name: Krista Gordon MRN: [id number] DOB: 1941/03/14   Cancelled Treatment:    Reason Eval/Treat Not Completed: OT screened, no needs identified, will sign off. Order received, chart reviewed. Per mobility tech and PT, pt near baseline functional independence. No skilled acute OT needs identified. Will sign off. Please re-consult if additional needs arise.   Dessie Coma, M.S. OTR/L  10/19/21, 2:30 PM  ascom [phone]

## 2021-10-19 NOTE — Progress Notes (Signed)
Mobility Specialist - Progress Note   10/19/21 1000  Mobility  Activity Ambulated in room;Ambulated to bathroom;Transferred:  Bed to chair  Level of Assistance Standby assist, set-up cues, supervision of patient - no hands on  Assistive Device None  Distance Ambulated (ft) 25 ft  Mobility Out of bed for toileting;Out of bed to chair with meals;Ambulated with assistance in room  Mobility Response Tolerated well  Mobility performed by Mobility specialist  $Mobility charge 1 Mobility    Pt lying in bed upon arrival, utilizing RA. Pt modI for bed mobility. Ambulated to bathroom for BM prior to ambulating to chair, supervision for OOB and peri-care. No LOB. No complaints. O2 92%, HR 85-100 bpm. Pt left in recliner with alarm set, needs in reach.    Kathee Delton Mobility Specialist 10/19/21, 10:47 AM

## 2021-10-20 LAB — COMPREHENSIVE METABOLIC PANEL
ALT: 17 U/L (ref 0–44)
AST: 34 U/L (ref 15–41)
Albumin: 2.6 g/dL — ABNORMAL LOW (ref 3.5–5.0)
Alkaline Phosphatase: 62 U/L (ref 38–126)
Anion gap: 14 (ref 5–15)
BUN: 69 mg/dL — ABNORMAL HIGH (ref 8–23)
CO2: 28 mmol/L (ref 22–32)
Calcium: 7.3 mg/dL — ABNORMAL LOW (ref 8.9–10.3)
Chloride: 96 mmol/L — ABNORMAL LOW (ref 98–111)
Creatinine, Ser: 4.72 mg/dL — ABNORMAL HIGH (ref 0.44–1.00)
GFR, Estimated: 9 mL/min — ABNORMAL LOW (ref >60)
Glucose, Bld: 212 mg/dL — ABNORMAL HIGH (ref 70–99)
Potassium: 3.9 mmol/L (ref 3.5–5.1)
Sodium: 138 mmol/L (ref 135–145)
Total Bilirubin: 0.8 mg/dL (ref 0.3–1.2)
Total Protein: 5.8 g/dL — ABNORMAL LOW (ref 6.5–8.1)

## 2021-10-20 LAB — CBC WITH DIFFERENTIAL/PLATELET
Abs Immature Granulocytes: 0.07 10*3/uL (ref 0.00–0.07)
Basophils Absolute: 0 10*3/uL (ref 0.0–0.1)
Basophils Relative: 0 %
Eosinophils Absolute: 0 10*3/uL (ref 0.0–0.5)
Eosinophils Relative: 0 %
HCT: 29.6 % — ABNORMAL LOW (ref 36.0–46.0)
Hemoglobin: 9.7 g/dL — ABNORMAL LOW (ref 12.0–15.0)
Immature Granulocytes: 1 %
Lymphocytes Relative: 8 %
Lymphs Abs: 0.6 10*3/uL — ABNORMAL LOW (ref 0.7–4.0)
MCH: 31.5 pg (ref 26.0–34.0)
MCHC: 32.8 g/dL (ref 30.0–36.0)
MCV: 96.1 fL (ref 80.0–100.0)
Monocytes Absolute: 0.3 10*3/uL (ref 0.1–1.0)
Monocytes Relative: 5 %
Neutro Abs: 6.1 10*3/uL (ref 1.7–7.7)
Neutrophils Relative %: 86 %
Platelets: 136 10*3/uL — ABNORMAL LOW (ref 150–400)
RBC: 3.08 MIL/uL — ABNORMAL LOW (ref 3.87–5.11)
RDW: 13.3 % (ref 11.5–15.5)
WBC: 7.1 10*3/uL (ref 4.0–10.5)
nRBC: 0 % (ref 0.0–0.2)

## 2021-10-20 LAB — C-REACTIVE PROTEIN: CRP: 3 mg/dL — ABNORMAL HIGH (ref <1.0)

## 2021-10-20 LAB — D-DIMER, QUANTITATIVE: D-Dimer, Quant: 2.26 ug/mL-FEU — ABNORMAL HIGH (ref 0.00–0.50)

## 2021-10-20 LAB — HEPATITIS B SURFACE ANTIBODY, QUANTITATIVE: Hep B S AB Quant (Post): <3.1 m[IU]/mL — ABNORMAL LOW (ref >9.9)

## 2021-10-20 LAB — GLUCOSE, CAPILLARY
Glucose-Capillary: 189 mg/dL — ABNORMAL HIGH (ref 70–99)
Glucose-Capillary: 112 mg/dL — ABNORMAL HIGH (ref 70–99)

## 2021-10-20 LAB — FERRITIN: Ferritin: 2088 ng/mL — ABNORMAL HIGH (ref 11–307)

## 2021-10-20 MED ORDER — SODIUM BICARBONATE 650 MG PO TABS: 1300.0000 mg | ORAL_TABLET | Freq: Two times a day (BID) | ORAL | 0 refills | Status: AC

## 2021-10-20 MED ORDER — ZINC SULFATE 220 (50 ZN) MG PO CAPS: 220.0000 mg | ORAL_CAPSULE | Freq: Every day | ORAL | 0 refills | Status: AC

## 2021-10-20 MED ORDER — FAMOTIDINE 10 MG PO TABS: 10.0000 mg | ORAL_TABLET | Freq: Every day | ORAL | 0 refills | Status: DC

## 2021-10-20 MED ORDER — RENA-VITE PO TABS: 1.0000 | ORAL_TABLET | Freq: Every day | ORAL | 0 refills | Status: DC

## 2021-10-20 MED ORDER — MIDODRINE HCL 2.5 MG PO TABS: 2.5000 mg | ORAL_TABLET | Freq: Two times a day (BID) | ORAL | 0 refills | Status: AC

## 2021-10-20 MED ORDER — ASCORBIC ACID 500 MG PO TABS: 500.0000 mg | ORAL_TABLET | Freq: Every day | ORAL | 0 refills | Status: AC

## 2021-10-20 MED ORDER — MUPIROCIN 2 % EX OINT: TOPICAL_OINTMENT | Freq: Two times a day (BID) | CUTANEOUS | 0 refills | Status: DC

## 2021-10-20 MED ORDER — MIDODRINE HCL 5 MG PO TABS
2.5000 mg | ORAL_TABLET | Freq: Two times a day (BID) | ORAL | Status: DC
  Administered 2021-10-20: 2.5 mg via ORAL
  Filled 2021-10-20: qty 1

## 2021-10-20 NOTE — Discharge Summary (Signed)
Discharge Summary  Krista Gordon KKX:[id number] DOB: 05-Feb-1941  PCP: Marguerita Merles, MD  Admit date: 10/16/2021 Discharge date: 10/20/2021  Time spent: 35 minutes.  Recommendations for Outpatient Follow-up:  Follow-up with your PCP Keep your hemodialysis appointments  Discharge Diagnoses:  Active Hospital Problems   Diagnosis Date Noted   Pneumonia due to COVID-19 virus 10/16/2021    Resolved Hospital Problems  No resolved problems to display.    Discharge Condition: Stable  Diet recommendation: Resume previous diet  Vitals:   10/20/21 1200 10/20/21 1312  BP: (!) 127/43 (!) 125/52  Pulse: (!) 47 (!) 56  Resp: (!) 25 20  Temp: 98.4 F (36.9 C) 98.3 F (36.8 C)  SpO2: 98% 98%    History of present illness:   81 y.o. 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 intake.  She has been having cough productive of whitish sputum over the last 3 days without dyspnea or fever or chills.  She denies any nausea or vomiting or diarrhea.  No loss of taste or smell.  No chest pain or palpitations. Pt was found to be covid pos. she completed her course of IV antiviral, remdesivir.  Also received bronchodilators, IV antibiotics, IV steroids, and pulmonary toilet.   10/20/2021: Seen at hemodialysis center.  There were no acute events overnight.  She has no new complaints.  She is eager to go home.  Hospital Course:  Principal Problem:   Pneumonia due to COVID-19 virus  Treated multifocal pneumonia secondary to COVID-19. -Given multifocal pneumonia pt was started on empiric IV Rocephin and Zithromax for suspected superimposed bacterial pulmonary infection with elevated Procalcitonin of over 0.7 and positive COVID-19 screening  test. Personally reviewed chest x-ray done on admission which shows bilateral pulmonary infiltrates. Completed course of IV Remdesivir, IV steroid therapy with IV Solu-Medrol, IV antibiotics. Received vitamin D3, vitamin C, zinc sulfate, p.o. Pepcid and aspirin.  End-stage renal disease on hemodialysis TTS. Seen by nephrology. Last HD on 10/20/2021.  Dyslipidemia. -  statin therapy. -LFT's stable Restarted Lipitor 40 mg daily Follow-up with your PCP  Hypertension, BPs have been soft Continue to hold off home oral antihypertensive Small dose midodrine 2.5 mg twice daily added to avoid hypotension. Maintain MAP greater than 65. Follow-up with your PCP.  History of type 2 diabetes mellitus. Hemoglobin A1c 4.3 on 10/17/2021 Insulin held Avoid hypoglycemia in the setting of ESRD.   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 No overt bleeding.   Physical debility PT OT to assess, no need identified, and no further recommendations. Continue fall precautions.      Consultants:  Nephrology   Procedures:      Antimicrobials: Anti-infectives (From admission, onward)        Start     Dose/Rate Route Frequency Ordered Stop    10/17/21 1000   remdesivir 100 mg in sodium chloride 0.9 % 100 mL IVPB       See Hyperspace for full Linked Orders Report.   100 mg 200 mL/hr over 30 Minutes Intravenous Daily 10/16/21 2304 10/21/21 0959    10/17/21 1000   remdesivir 100 mg in sodium chloride 0.9 % 100 mL IVPB  Status:  Discontinued       See Hyperspace for full  Linked Orders Report.   100 mg 200 mL/hr over 30 Minutes Intravenous Daily 10/16/21 2348 10/16/21 2355    10/17/21 0000   remdesivir 200 mg in sodium chloride 0.9% 250 mL IVPB       See Hyperspace for full Linked Orders Report.   200 mg 580 mL/hr over 30 Minutes Intravenous Once 10/16/21 2304 10/17/21 0132    10/17/21 0000   remdesivir 200 mg in sodium chloride 0.9% 250 mL IVPB  Status:   Discontinued       See Hyperspace for full Linked Orders Report.   200 mg 580 mL/hr over 30 Minutes Intravenous Once 10/16/21 2348 10/16/21 2355              Procedures: Hemodialysis  Consultations: Nephrology  Discharge Exam: BP (!) 125/52    Pulse (!) 56    Temp 98.3 F (36.8 C)    Resp 20    Ht 5' 2.01" (1.575 m)    Wt 62.3 kg    SpO2 98%    BMI 25.11 kg/m  General: 81 y.o. year-old female well developed well nourished in no acute distress.  Alert and oriented x3. Cardiovascular: Regular rate and rhythm with no rubs or gallops.  No thyromegaly or JVD noted.   Respiratory: Clear to auscultation with no wheezes or rales. Good inspiratory effort. Abdomen: Soft nontender nondistended with normal bowel sounds x4 quadrants. Musculoskeletal: No lower extremity edema. 2/4 pulses in all 4 extremities. Skin: No ulcerative lesions noted or rashes, Psychiatry: Mood is appropriate for condition and setting  Discharge Instructions You were cared for by a hospitalist during your hospital stay. If you have any questions about your discharge medications or the care you received while you were in the hospital after you are discharged, you can call the unit and asked to speak with the hospitalist on call if the hospitalist that took care of you is not available. Once you are discharged, your primary care physician will handle any further medical issues. Please note that NO REFILLS for any discharge medications will be authorized once you are discharged, as it is imperative that you return to your primary care physician (or establish a relationship with a primary care physician if you do not have one) for your aftercare needs so that they can reassess your need for medications and monitor your lab values.   Allergies as of 10/20/2021       Reactions   Mircera [methoxy Polyethylene Glycol-epoetin Beta]         Medication List     STOP taking these medications    alendronate 70 MG  tablet Commonly known as: FOSAMAX   amLODipine 5 MG tablet Commonly known as: NORVASC   calcitonin (salmon) 200 UNIT/ACT nasal spray Commonly known as: MIACALCIN/FORTICAL   glipiZIDE 5 MG 24 hr tablet Commonly known as: GLUCOTROL XL   linagliptin 5 MG Tabs tablet Commonly known as: TRADJENTA   multivitamin with minerals Tabs tablet   quinapril 40 MG tablet Commonly known as: ACCUPRIL   Vitamin D 50 MCG (2000 UT) tablet       TAKE these medications    ascorbic acid 500 MG tablet Commonly known as: VITAMIN C Take 1 tablet (500 mg total) by mouth daily.   aspirin 81 MG EC tablet Take 81 mg by mouth at bedtime. Swallow whole.   atorvastatin 40 MG tablet Commonly known as: LIPITOR Take 40 mg by mouth at bedtime.   colchicine 0.6 MG tablet Take 0.6 mg by  mouth at bedtime.   famotidine 10 MG tablet Commonly known as: PEPCID Take 1 tablet (10 mg total) by mouth daily.   ferrous sulfate 325 (65 FE) MG tablet Take 325 mg by mouth daily.   midodrine 2.5 MG tablet Commonly known as: PROAMATINE Take 1 tablet (2.5 mg total) by mouth 2 (two) times daily with a meal.   multivitamin Tabs tablet Take 1 tablet by mouth at bedtime.   mupirocin ointment 2 % Commonly known as: BACTROBAN Apply topically 2 (two) times daily.   ProAir HFA 108 (90 Base) MCG/ACT inhaler Generic drug: albuterol Inhale 2 puffs into the lungs every 6 (six) hours as needed for shortness of breath.   sodium bicarbonate 650 MG tablet Take 2 tablets (1,300 mg total) by mouth 2 (two) times daily. What changed:  how much to take when to take this   torsemide 10 MG tablet Commonly known as: DEMADEX Take 10 mg by mouth daily.   zinc sulfate 220 (50 Zn) MG capsule Take 1 capsule (220 mg total) by mouth daily.       Allergies  Allergen Reactions   Mircera [Methoxy Polyethylene Glycol-Epoetin Beta]     Follow-up Information     Marguerita Merles, MD. Go on 10/28/2021.   Specialty: Family  Medicine Why: Please call for a posthospital follow-up appointment. 10:40am appointment Contact information: [place] [address] [phone]                  The results of significant diagnostics from this hospitalization (including imaging, microbiology, ancillary and laboratory) are listed below for reference.    Significant Diagnostic Studies: DG Chest 2 View  Result Date: 10/16/2021 CLINICAL DATA:  COVID. Decreased appetite for 3 days. Weakness. Dialysis patient. EXAM: CHEST - 2 VIEW COMPARISON:  None. FINDINGS: The heart is enlarged. Diffuse interstitial prominence noted. This is greatest at the lung bases. No significant airspace consolidation is present. Fused anterior osteophytes are present within the thoracic spine. IMPRESSION: 1. Cardiomegaly with diffuse interstitial prominence. While this may represent edema, viral infection is also considered. Electronically Signed   By: San Morelle M.D.   On: 10/16/2021 20:24    Microbiology: Recent Results (from the past 240 hour(s))  Resp Panel by RT-PCR (Flu A&B, Covid) Nasopharyngeal Swab     Status: Abnormal   Collection Time: 10/16/21  2:19 PM   Specimen: Nasopharyngeal Swab; Nasopharyngeal(NP) swabs in vial transport medium  Result Value Ref Range Status   SARS Coronavirus 2 by RT PCR POSITIVE (A) NEGATIVE Final    Comment: (NOTE) SARS-CoV-2 target nucleic acids are DETECTED.  The SARS-CoV-2 RNA is generally detectable in upper respiratory specimens during the acute phase of infection. Positive results are indicative of the presence of the identified virus, but do not rule out bacterial infection or co-infection with other pathogens not detected by the test. Clinical correlation with patient history and other diagnostic information is necessary to determine patient infection status. The expected result is Negative.  Fact Sheet for  Patients: EntrepreneurPulse.com.au  Fact Sheet for Healthcare Providers: IncredibleEmployment.be  This test is not yet approved or cleared by the [place] FDA and  has been authorized for detection and/or diagnosis of SARS-CoV-2 by FDA under an Emergency Use Authorization (EUA).  This EUA will remain in effect (meaning this test can be used) for the duration of  the COVID-19 declaration under Section 564(b)(1) of the A ct, 21 U.S.C. section 360bbb-3(b)(1), unless the authorization is terminated or  revoked sooner.     Influenza A by PCR NEGATIVE NEGATIVE Final   Influenza B by PCR NEGATIVE NEGATIVE Final    Comment: (NOTE) The Xpert Xpress SARS-CoV-2/FLU/RSV plus assay is intended as an aid in the diagnosis of influenza from Nasopharyngeal swab specimens and should not be used as a sole basis for treatment. Nasal washings and aspirates are unacceptable for Xpert Xpress SARS-CoV-2/FLU/RSV testing.  Fact Sheet for Patients: EntrepreneurPulse.com.au  Fact Sheet for Healthcare Providers: IncredibleEmployment.be  This test is not yet approved or cleared by the [place] FDA and has been authorized for detection and/or diagnosis of SARS-CoV-2 by FDA under an Emergency Use Authorization (EUA). This EUA will remain in effect (meaning this test can be used) for the duration of the COVID-19 declaration under Section 564(b)(1) of the Act, 21 U.S.C. section 360bbb-3(b)(1), unless the authorization is terminated or revoked.  Performed at [facility], [address] [place], [place] [address]   Expectorated Sputum Assessment w Gram Stain, Rflx to Resp Cult     Status: None   Collection Time: 10/17/21  5:00 AM   Specimen: Sputum  Result Value Ref Range Status   Specimen Description   Final    SPUTUM Performed at [facility], [address], [place], [place] [address]    Special  Requests   Final    NONE Performed at [facility], [address] [place], [place] [address]    Sputum evaluation   Final    Sputum specimen not acceptable for testing.  Please recollect.   Results Called to: N. POLIUM 10/17/21 @ 1603 FH Performed at [facility], [place] [address] [place], [place] [address]    Report Status 10/17/2021 FINAL  Final  MRSA Next Gen by PCR, Nasal     Status: None   Collection Time: 10/18/21  5:22 AM   Specimen: Nasal Mucosa; Nasal Swab  Result Value Ref Range Status   MRSA by PCR Next Gen NOT DETECTED NOT DETECTED Final    Comment: (NOTE) The GeneXpert MRSA Assay (FDA approved for NASAL specimens only), is one component of a comprehensive MRSA colonization surveillance program. It is not intended to diagnose MRSA infection nor to guide or monitor treatment for MRSA infections. Test performance is not FDA approved in patients less than 74 years old. Performed at [facility], [address] [place], [place] [address]      Labs: Basic Metabolic Panel: Recent Labs  Lab 10/16/21 1419 10/16/21 2356 10/17/21 0500 10/18/21 0538 10/19/21 0409 10/20/21 0501  NA 136  --  138 138 134* 138  K 4.4  --  5.1 4.8 3.7 3.9  CL 105  --  106 104 97* 96*  CO2 23  --  24 22 28 28   GLUCOSE 88  --  96 125* 103* 212*  BUN 25*  --  37* 65* 36* 69*  CREATININE 5.27* 5.98* 6.11* 6.62* 3.64* 4.72*  CALCIUM 8.1*  --  8.1* 7.8* 7.5* 7.3*  PHOS  --   --   --   --  3.7  --    Liver Function Tests: Recent Labs  Lab 10/16/21 1419 10/17/21 0500 10/18/21 0538 10/19/21 0409 10/20/21 0501  AST 44* 40 35 29 34  ALT 18 16 15 13 17   ALKPHOS 67 67 68 57 62  BILITOT 0.8 0.9 0.7 0.9 0.8  PROT 6.7 6.3* 6.3* 5.9* 5.8*  ALBUMIN 2.8* 2.7* 2.7* 2.4* 2.6*   No results for  input(s): LIPASE, AMYLASE in the last 168 hours. No results for input(s): AMMONIA in the last 168 hours. CBC: Recent Labs  Lab 10/16/21 2356 10/17/21 0500 10/18/21 0538  10/19/21 0409 10/20/21 0501  WBC 5.1 4.5 7.4 8.4 7.1  NEUTROABS  --  3.9 6.1 7.2 6.1  HGB 10.2* 10.1* 10.1* 9.2* 9.7*  HCT 34.6* 34.8* 32.6* 28.8* 29.6*  MCV 107.1* 106.7* 101.6* 96.0 96.1  PLT 88* 90* 125* 124* 136*   Cardiac Enzymes: No results for input(s): CKTOTAL, CKMB, CKMBINDEX, TROPONINI in the last 168 hours. BNP: BNP (last 3 results) Recent Labs    10/16/21 1419 10/16/21 2356  BNP 92.9 64.8    ProBNP (last 3 results) No results for input(s): PROBNP in the last 8760 hours.  CBG: Recent Labs  Lab 10/19/21 1143 10/19/21 1643 10/19/21 2107 10/20/21 0820 10/20/21 1310  GLUCAP 189* 210* 146* 189* 112*       Signed:  Kayleen Memos, MD [facility] 10/20/2021, 3:30 PM

## 2021-10-20 NOTE — TOC Transition Note (Signed)
Transition of Care [facility]) - CM/SW Discharge Note   Patient Details  Name: Krista Gordon MRN: [id number] Date of Birth: 01-10-1941  [facility] CM/SW Contact:  Beverly Sessions, RN Phone Number: 10/20/2021, 1:13 PM   Clinical Narrative:    Confirmed with patient that she does not have issues getting to and from facility.  [name] dialysis liaison notified of discharge           Patient Goals and CMS Choice        Discharge Placement                       Discharge Plan and Services                                     Social Determinants of Health (SDOH) Interventions     Readmission Risk Interventions No flowsheet data found.

## 2021-10-20 NOTE — Progress Notes (Signed)
Patient completes a scheduled 2.5 hr. HD session. LUA AVG cannulates with ease, however, VP high and outside of BFR parameters. BFR reduced to avoid recirculation and ensure adequacy. No UF goal set for this HD session, patient below targeted outpatient dry weight. No meds or labs this session, report given to primary nurse patient to return to assigned room.

## 2021-10-20 NOTE — Progress Notes (Signed)
[facility]  ROUNDING NOTE   Subjective:   Patient seen and evaluated today during dialysis   HEMODIALYSIS FLOWSHEET:  Blood Flow Rate (mL/min): 350 mL/min Arterial Pressure (mmHg): -150 mmHg Venous Pressure (mmHg): 240 mmHg Transmembrane Pressure (mmHg): 60 mmHg Ultrafiltration Rate (mL/min): 0 mL/min Dialysate Flow Rate (mL/min): 500 ml/min Conductivity: Machine : 13.9 Conductivity: Machine : 13.9 Dialysis Fluid Bolus: Normal Saline Bolus Amount (mL): 250 mL  No complaints at this time Tolerating treatment well   Objective:  Vital signs in last 24 hours:  Temp:  [98.1 F (36.7 C)-98.6 F (37 C)] 98.3 F (36.8 C) (01/05 1312) Pulse Rate:  [47-71] 56 (01/05 1312) Resp:  [16-25] 20 (01/05 1312) BP: (90-127)/(43-61) 125/52 (01/05 1312) SpO2:  [94 %-100 %] 98 % (01/05 1312) Weight:  [62.3 kg] 62.3 kg (01/05 0908)  Weight change:  Filed Weights   10/18/21 1855 10/18/21 2158 10/20/21 0908  Weight: 60.4 kg 63.1 kg 62.3 kg    Intake/Output: I/O last 3 completed shifts: In: -  Out: -351    Intake/Output this shift:  Total I/O In: -  Out: -479   Physical Exam: General: NAD, resting in bed  Head: Normocephalic, atraumatic. Moist oral mucosal membranes  Eyes: Anicteric  Lungs:  Clear to auscultation, normal effort  Heart: Regular rate and rhythm  Abdomen:  Soft, nontender  Extremities: No peripheral edema.  Neurologic: Nonfocal, moving all four extremities  Skin: No lesions  Access: LUE AVG    Basic Metabolic Panel: Recent Labs  Lab 10/16/21 1419 10/16/21 2356 10/17/21 0500 10/18/21 0538 10/19/21 0409 10/20/21 0501  NA 136  --  138 138 134* 138  K 4.4  --  5.1 4.8 3.7 3.9  CL 105  --  106 104 97* 96*  CO2 23  --  24 22 28 28   GLUCOSE 88  --  96 125* 103* 212*  BUN 25*  --  37* 65* 36* 69*  CREATININE 5.27* 5.98* 6.11* 6.62* 3.64* 4.72*  CALCIUM 8.1*  --  8.1* 7.8* 7.5* 7.3*  PHOS  --   --   --   --  3.7  --      Liver Function  Tests: Recent Labs  Lab 10/16/21 1419 10/17/21 0500 10/18/21 0538 10/19/21 0409 10/20/21 0501  AST 44* 40 35 29 34  ALT 18 16 15 13 17   ALKPHOS 67 67 68 57 62  BILITOT 0.8 0.9 0.7 0.9 0.8  PROT 6.7 6.3* 6.3* 5.9* 5.8*  ALBUMIN 2.8* 2.7* 2.7* 2.4* 2.6*    No results for input(s): LIPASE, AMYLASE in the last 168 hours. No results for input(s): AMMONIA in the last 168 hours.  CBC: Recent Labs  Lab 10/16/21 2356 10/17/21 0500 10/18/21 0538 10/19/21 0409 10/20/21 0501  WBC 5.1 4.5 7.4 8.4 7.1  NEUTROABS  --  3.9 6.1 7.2 6.1  HGB 10.2* 10.1* 10.1* 9.2* 9.7*  HCT 34.6* 34.8* 32.6* 28.8* 29.6*  MCV 107.1* 106.7* 101.6* 96.0 96.1  PLT 88* 90* 125* 124* 136*     Cardiac Enzymes: No results for input(s): CKTOTAL, CKMB, CKMBINDEX, TROPONINI in the last 168 hours.  BNP: Invalid input(s): POCBNP  CBG: Recent Labs  Lab 10/19/21 1143 10/19/21 1643 10/19/21 2107 10/20/21 0820 10/20/21 1310  GLUCAP 189* 210* 146* 189* 112*     Microbiology: Results for orders placed or performed during the hospital encounter of 10/16/21  Resp Panel by RT-PCR (Flu A&B, Covid) Nasopharyngeal Swab     Status: Abnormal  Collection Time: 10/16/21  2:19 PM   Specimen: Nasopharyngeal Swab; Nasopharyngeal(NP) swabs in vial transport medium  Result Value Ref Range Status   SARS Coronavirus 2 by RT PCR POSITIVE (A) NEGATIVE Final    Comment: (NOTE) SARS-CoV-2 target nucleic acids are DETECTED.  The SARS-CoV-2 RNA is generally detectable in upper respiratory specimens during the acute phase of infection. Positive results are indicative of the presence of the identified virus, but do not rule out bacterial infection or co-infection with other pathogens not detected by the test. Clinical correlation with patient history and other diagnostic information is necessary to determine patient infection status. The expected result is Negative.  Fact Sheet for  Patients: EntrepreneurPulse.com.au  Fact Sheet for Healthcare Providers: IncredibleEmployment.be  This test is not yet approved or cleared by the [place] FDA and  has been authorized for detection and/or diagnosis of SARS-CoV-2 by FDA under an Emergency Use Authorization (EUA).  This EUA will remain in effect (meaning this test can be used) for the duration of  the COVID-19 declaration under Section 564(b)(1) of the A ct, 21 U.S.C. section 360bbb-3(b)(1), unless the authorization is terminated or revoked sooner.     Influenza A by PCR NEGATIVE NEGATIVE Final   Influenza B by PCR NEGATIVE NEGATIVE Final    Comment: (NOTE) The Xpert Xpress SARS-CoV-2/FLU/RSV plus assay is intended as an aid in the diagnosis of influenza from Nasopharyngeal swab specimens and should not be used as a sole basis for treatment. Nasal washings and aspirates are unacceptable for Xpert Xpress SARS-CoV-2/FLU/RSV testing.  Fact Sheet for Patients: EntrepreneurPulse.com.au  Fact Sheet for Healthcare Providers: IncredibleEmployment.be  This test is not yet approved or cleared by the [place] FDA and has been authorized for detection and/or diagnosis of SARS-CoV-2 by FDA under an Emergency Use Authorization (EUA). This EUA will remain in effect (meaning this test can be used) for the duration of the COVID-19 declaration under Section 564(b)(1) of the Act, 21 U.S.C. section 360bbb-3(b)(1), unless the authorization is terminated or revoked.  Performed at [facility], [address] [place], [place] [address]   Expectorated Sputum Assessment w Gram Stain, Rflx to Resp Cult     Status: None   Collection Time: 10/17/21  5:00 AM   Specimen: Sputum  Result Value Ref Range Status   Specimen Description   Final    SPUTUM Performed at [facility], [address], [place], [place] [address]    Special  Requests   Final    NONE Performed at [facility], [address] [place], [place] [address]    Sputum evaluation   Final    Sputum specimen not acceptable for testing.  Please recollect.   Results Called to: N. POLIUM 10/17/21 @ 1603 FH Performed at [facility], [address], [place], [place] [address]    Report Status 10/17/2021 FINAL  Final  MRSA Next Gen by PCR, Nasal     Status: None   Collection Time: 10/18/21  5:22 AM   Specimen: Nasal Mucosa; Nasal Swab  Result Value Ref Range Status   MRSA by PCR Next Gen NOT DETECTED NOT DETECTED Final    Comment: (NOTE) The GeneXpert MRSA Assay (FDA approved for NASAL specimens only), is one component of a comprehensive MRSA colonization surveillance program. It is not intended to diagnose MRSA infection nor to guide or monitor treatment for MRSA infections. Test performance is not FDA approved in patients less than 38 years old. Performed at  [address] [place], [place] [address]     Coagulation Studies: No results for input(s): LABPROT, INR in the last 72 hours.  Urinalysis: No results for input(s): COLORURINE, LABSPEC, PHURINE, GLUCOSEU, HGBUR, BILIRUBINUR, KETONESUR, PROTEINUR, UROBILINOGEN, NITRITE, LEUKOCYTESUR in the last 72 hours.  Invalid input(s): APPERANCEUR    Imaging: No results found.   Medications:      vitamin C  500 mg Oral Daily   aspirin EC  81 mg Oral QHS   atorvastatin  40 mg Oral QHS   calcitonin (salmon)  1 spray Alternating Nares QHS   Chlorhexidine Gluconate Cloth  6 each Topical Q0600   cholecalciferol  2,000 Units Oral Daily   colchicine  0.6 mg Oral QODAY   famotidine  10 mg Oral BID   feeding supplement (NEPRO CARB STEADY)  237 mL Oral TID BM   ferrous sulfate  325 mg Oral Daily   heparin  5,000 Units Subcutaneous Q8H   insulin aspart  0-5 Units Subcutaneous QHS   insulin aspart  0-9 Units Subcutaneous TID WC   midodrine  2.5 mg Oral BID WC    multivitamin  1 tablet Oral QHS   mupirocin ointment   Topical BID   predniSONE  50 mg Oral Daily   sodium bicarbonate  1,300 mg Oral BID   torsemide  10 mg Oral Daily   zinc sulfate  220 mg Oral Daily   acetaminophen, albuterol, chlorpheniramine-HYDROcodone, guaiFENesin-dextromethorphan, HYDROcodone-acetaminophen, magnesium hydroxide, ondansetron **OR** ondansetron (ZOFRAN) IV, traZODone  Assessment/ Plan:  Ms. Krista Gordon is a 81 y.o.  female with a past medical history including dyslipidemia, hypertension, diabetes, and end-stage renal disease on hemodialysis, who was admitted to [facility] on 10/16/2021 for Thrombocytopenia ([place]) [D69.6] Hypotension, unspecified hypotension type [I95.9] Pneumonia due to COVID-19 virus [U07.1, J12.82] COVID-19 [U07.1]   [facility] [place]/TTS/left AVG/64 kg  1.  End-stage renal disease on hemodialysis.  Will maintain outpatient schedule, if possible.  Dialysis coordinator aware of patient and COVID status.  We will make appropriate arrangements for outpatient treatment at discharge.   -Currently receiving dialysis, tolerating well.  UF goal 0. -Next scheduled treatment on Saturday   2 Anemia of chronic kidney disease Epogen and Mircera given outpatient. Hemoglobin remains within acceptable target.   3. Secondary Hyperparathyroidism: with outpatient labs: PTH 345, phosphorus 3.7, calcium 9.0 on 09/27/21.   Calcium remains below target Continue calcitonin, cholecalciferol and offering Nepro shakes between meals.   4.  Hypertension with chronic kidney disease.  Home regimen includes amlodipine, quinapril, and torsemide.  Torsemide restarted at 10 mg daily.  Consider dosing diuretic on nondialysis days.  BP currently 125/52   5. 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 Home regimen includes glipizide and Tradjenta, currently held. Most recent hemoglobin A1c is 4.3 on 10/17/21.  Glucose stable   6.  COVID-19  pneumonia.  Scheduled to receive last dose of remdesivir today.    LOS: [place] 1/5/20232:34 PM

## 2021-10-24 ENCOUNTER — Encounter: Admission: RE | Payer: Self-pay | Source: Home / Self Care

## 2021-10-24 ENCOUNTER — Ambulatory Visit: Admission: RE | Admit: 2021-10-24 | Payer: Medicare Other | Source: Home / Self Care | Admitting: Vascular Surgery

## 2021-10-24 DIAGNOSIS — N186 End stage renal disease: Secondary | ICD-10-CM

## 2021-10-24 SURGERY — A/V FISTULAGRAM
Anesthesia: Moderate Sedation | Laterality: Right

## 2021-11-07 ENCOUNTER — Ambulatory Visit: Payer: Medicare Other | Admitting: Urology

## 2021-11-10 ENCOUNTER — Telehealth (INDEPENDENT_AMBULATORY_CARE_PROVIDER_SITE_OTHER): Payer: Self-pay

## 2021-11-10 NOTE — Telephone Encounter (Signed)
I attempted to contact the patient to reschedule the left arm fistulagram and a message was left for a return call.

## 2021-11-30 ENCOUNTER — Telehealth (INDEPENDENT_AMBULATORY_CARE_PROVIDER_SITE_OTHER): Payer: Self-pay

## 2021-11-30 NOTE — Telephone Encounter (Signed)
I contacted [facility] to get help in getting the patient rescheduled for a left arm fistulagram with Dr. Lucky Cowboy. A message was left with them to have the patient call me to be rescheduled.

## 2021-12-01 NOTE — Telephone Encounter (Signed)
Patient returned my call and is scheduled with Dr. Lucky Cowboy for a left arm fistulagram with a 1:00 pm arrival time to the MM. Pre-procedure instructions were discussed and patient stated she understood.

## 2021-12-05 ENCOUNTER — Other Ambulatory Visit: Payer: Self-pay

## 2021-12-05 ENCOUNTER — Other Ambulatory Visit (INDEPENDENT_AMBULATORY_CARE_PROVIDER_SITE_OTHER): Payer: Self-pay | Admitting: Nurse Practitioner

## 2021-12-05 ENCOUNTER — Ambulatory Visit
Admission: RE | Admit: 2021-12-05 | Discharge: 2021-12-05 | Disposition: A | Discharge status: Home / Self Care | Payer: Medicare Other | Attending: Vascular Surgery | Admitting: Vascular Surgery

## 2021-12-05 ENCOUNTER — Encounter
Admission: RE | Disposition: A | Discharge status: Home / Self Care | Payer: Self-pay | Source: Home / Self Care | Attending: Vascular Surgery

## 2021-12-05 ENCOUNTER — Encounter: Payer: Self-pay | Admitting: Vascular Surgery

## 2021-12-05 DIAGNOSIS — N186 End stage renal disease: Secondary | ICD-10-CM | POA: Diagnosis not present

## 2021-12-05 DIAGNOSIS — Z992 Dependence on renal dialysis: Secondary | ICD-10-CM | POA: Diagnosis not present

## 2021-12-05 DIAGNOSIS — T82858A Stenosis of vascular prosthetic devices, implants and grafts, initial encounter: Secondary | ICD-10-CM | POA: Insufficient documentation

## 2021-12-05 DIAGNOSIS — Y841 Kidney dialysis as the cause of abnormal reaction of the patient, or of later complication, without mention of misadventure at the time of the procedure: Secondary | ICD-10-CM | POA: Insufficient documentation

## 2021-12-05 HISTORY — DX: COVID-19: U07.1

## 2021-12-05 HISTORY — PX: A/V FISTULAGRAM: CATH118298

## 2021-12-05 LAB — POTASSIUM (ARMC VASCULAR LAB ONLY): Potassium (ARMC vascular lab): 4.5 (ref 3.5–5.1)

## 2021-12-05 SURGERY — A/V FISTULAGRAM
Anesthesia: Moderate Sedation | Laterality: Left

## 2021-12-05 MED ORDER — MIDAZOLAM HCL 2 MG/2ML IJ SOLN
INTRAMUSCULAR | Status: DC | PRN
  Administered 2021-12-05 (×2): 1 mg via INTRAVENOUS

## 2021-12-05 MED ORDER — MIDAZOLAM HCL 2 MG/ML PO SYRP: 8.0000 mg | ORAL_SOLUTION | Freq: Once | ORAL | Status: DC | PRN

## 2021-12-05 MED ORDER — HYDROMORPHONE HCL 1 MG/ML IJ SOLN: 1.0000 mg | Freq: Once | INTRAMUSCULAR | Status: DC | PRN

## 2021-12-05 MED ORDER — DIPHENHYDRAMINE HCL 50 MG/ML IJ SOLN: 50.0000 mg | Freq: Once | INTRAMUSCULAR | Status: DC | PRN

## 2021-12-05 MED ORDER — HEPARIN SODIUM (PORCINE) 1000 UNIT/ML IJ SOLN
INTRAMUSCULAR | Status: DC | PRN
  Administered 2021-12-05: 3000 [IU] via INTRAVENOUS

## 2021-12-05 MED ORDER — CEFAZOLIN SODIUM-DEXTROSE 1-4 GM/50ML-% IV SOLN: 1.0000 g | Freq: Once | INTRAVENOUS | Status: DC

## 2021-12-05 MED ORDER — CEFAZOLIN SODIUM-DEXTROSE 1-4 GM/50ML-% IV SOLN
INTRAVENOUS | Status: DC | PRN
Start: 2021-12-05 — End: 2021-12-05
  Administered 2021-12-05: 1 g via INTRAVENOUS

## 2021-12-05 MED ORDER — FENTANYL CITRATE (PF) 100 MCG/2ML IJ SOLN
INTRAMUSCULAR | Status: DC | PRN
  Administered 2021-12-05: 50 ug via INTRAVENOUS

## 2021-12-05 MED ORDER — SODIUM CHLORIDE 0.9 % IV SOLN: INTRAVENOUS | Status: DC

## 2021-12-05 MED ORDER — FENTANYL CITRATE PF 50 MCG/ML IJ SOSY
PREFILLED_SYRINGE | INTRAMUSCULAR | Status: AC
  Filled 2021-12-05: qty 1

## 2021-12-05 MED ORDER — FAMOTIDINE 20 MG PO TABS: 40.0000 mg | ORAL_TABLET | Freq: Once | ORAL | Status: DC | PRN

## 2021-12-05 MED ORDER — HEPARIN SODIUM (PORCINE) 1000 UNIT/ML IJ SOLN
INTRAMUSCULAR | Status: AC
  Filled 2021-12-05: qty 10

## 2021-12-05 MED ORDER — MIDAZOLAM HCL 2 MG/2ML IJ SOLN
INTRAMUSCULAR | Status: AC
  Filled 2021-12-05: qty 2

## 2021-12-05 MED ORDER — METHYLPREDNISOLONE SODIUM SUCC 125 MG IJ SOLR: 125.0000 mg | Freq: Once | INTRAMUSCULAR | Status: DC | PRN

## 2021-12-05 MED ORDER — IODIXANOL 320 MG/ML IV SOLN
INTRAVENOUS | Status: DC | PRN
  Administered 2021-12-05: 25 mL

## 2021-12-05 MED ORDER — ONDANSETRON HCL 4 MG/2ML IJ SOLN: 4.0000 mg | Freq: Four times a day (QID) | INTRAMUSCULAR | Status: DC | PRN

## 2021-12-05 SURGICAL SUPPLY — 17 items
BALLN DORADO 7X80X80 (BALLOONS) ×2
BALLN LUTONIX DCB 6X60X130 (BALLOONS) ×2
BALLOON DORADO 7X80X80 (BALLOONS) IMPLANT
BALLOON LUTONIX DCB 6X60X130 (BALLOONS) IMPLANT
CANNULA 5F STIFF (CANNULA) ×1 IMPLANT
CATH BEACON 5 .035 40 KMP TP (CATHETERS) IMPLANT
CATH BEACON 5 .038 40 KMP TP (CATHETERS) ×1
COVER PROBE U/S 5X48 (MISCELLANEOUS) ×1 IMPLANT
DRAPE BRACHIAL (DRAPES) ×1 IMPLANT
GLIDEWIRE ADV .035X180CM (WIRE) ×1 IMPLANT
KIT ENCORE 26 ADVANTAGE (KITS) ×1 IMPLANT
PACK ANGIOGRAPHY (CUSTOM PROCEDURE TRAY) ×2 IMPLANT
SHEATH BRITE TIP 6FRX5.5 (SHEATH) ×1 IMPLANT
SHEATH BRITE TIP 7FRX5.5 (SHEATH) ×1 IMPLANT
STENT VIABAHN 8X7.5X120 (Permanent Stent) ×1 IMPLANT
SUT MNCRL AB 4-0 PS2 18 (SUTURE) ×1 IMPLANT
WIRE G V18X300CM (WIRE) ×1 IMPLANT

## 2021-12-05 NOTE — Op Note (Signed)
[facility]    OPERATIVE NOTE   PROCEDURE: 1.  Left loop forearm arteriovenous graft cannulation under ultrasound guidance 2.  Left arm shuntogram 3.  Percutaneous transluminal angioplasty of the venous anastomosis with 6 mm diameter Lutonix drug-coated angioplasty balloon 4.  Viabahn stent placement across the venous anastomosis into the brachial vein with 8 mm diameter by 7.5 cm length stent for greater than 50% residual stenosis after angioplasty  PRE-OPERATIVE DIAGNOSIS: 1. ESRD 2. Malfunctioning left loop forearm arteriovenous graft  POST-OPERATIVE DIAGNOSIS: same as above   SURGEON: Leotis Pain, MD  ANESTHESIA: local with MCS  ESTIMATED BLOOD LOSS: 5 cc  FINDING(S): Brachial vein occluded at and just proximal to the venous anastomosis over several centimeters with the graft being kept open by collaterals retrograde to the actual anastomosis off the distal brachial vein.  After the short segment occlusion, the brachial vein was normal to the axillary vein and the central venous circulation was widely patent.  The remainder of the graft was patent without significant stenosis.  SPECIMEN(S):  None  CONTRAST: 25 cc  FLUORO TIME: 2.1 minutes  MODERATE CONSCIOUS SEDATION TIME:  Approximately 25 minutes using 2 mg of Versed and 50 mcg of Fentanyl  INDICATIONS: Krista Gordon is a 81 y.o. female who presents with malfunctioning left loop forearm arteriovenous graft.  The patient is scheduled for left arm shuntogram.  The patient is aware the risks include but are not limited to: bleeding, infection, thrombosis of the cannulated access, and possible anaphylactic reaction to the contrast.  The patient is aware of the risks of the procedure and elects to proceed forward.  DESCRIPTION: After full informed written consent was obtained, the patient was brought back to the angiography suite and placed supine upon the angiography table.  The patient was connected  to monitoring equipment. Moderate conscious sedation was administered during a face to face encounter throughout the procedure with my supervision of the RN administering medicines and monitoring the patient's vital signs, pulse oximetry, telemetry and mental status throughout from the start of the procedure until the patient was taken to the recovery room The left arm was prepped and draped in the standard fashion for a percutaneous access intervention.  Under ultrasound guidance, the left loop forearm arteriovenous graft was cannulated with a micropuncture needle under direct ultrasound guidance were it was patent and a permanent image was performed.  The microwire was advanced into the graft and the needle was exchanged for the a microsheath.  I then upsized to a 6 Fr Sheath and imaging was performed.  Hand injections were completed to image the access including the central venous system. This demonstrated that the brachial vein occluded at and just proximal to the venous anastomosis over several centimeters with the graft being kept open by collaterals retrograde to the actual anastomosis off the distal brachial vein.  After the short segment occlusion, the brachial vein was normal to the axillary vein and the central venous circulation was widely patent.  The remainder of the graft was patent without significant stenosis..  Based on the images, this patient will need treatment of this venous anastomotic lesion to salvage the graft. I then gave the patient 3000 units of intravenous heparin.  I then crossed the stenosis/occlusion with a Magic Tourqe wire.  Based on the imaging, a 6 mm x 6 cm  Lutonix drug coated angioplasty balloon was selected.  The balloon was centered around the stenosis and inflated to 14 ATM for 1  minute(s).  On completion imaging, a >50 % residual stenosis was present.  I elected to stent this area.  Upsized to a 7 [place] sheath and exchanged for a 0.018 wire and placed an 8 mm diameter  by 7.5 cm long Viabahn stent and deployed this across the venous anastomosis and into the brachial vein for several centimeters.  This was then postdilated with 7 mm diameter high-pressure angioplasty balloon with less than 10% residual stenosis after stent placement   Based on the completion imaging, no further intervention is necessary.  The wire and balloon were removed from the sheath.  A 4-0 Monocryl purse-string suture was sewn around the sheath.  The sheath was removed while tying down the suture.  A sterile bandage was applied to the puncture site.  COMPLICATIONS: None  CONDITION: Stable   Leotis Pain  12/05/2021 3:03 PM    This note was created with Dragon Medical transcription system. Any errors in dictation are purely unintentional.

## 2021-12-06 ENCOUNTER — Encounter: Payer: Self-pay | Admitting: Vascular Surgery

## 2022-01-04 ENCOUNTER — Ambulatory Visit (INDEPENDENT_AMBULATORY_CARE_PROVIDER_SITE_OTHER): Payer: Medicare Other | Admitting: Nurse Practitioner

## 2022-01-04 ENCOUNTER — Other Ambulatory Visit (INDEPENDENT_AMBULATORY_CARE_PROVIDER_SITE_OTHER): Payer: Self-pay | Admitting: Vascular Surgery

## 2022-01-04 ENCOUNTER — Encounter (INDEPENDENT_AMBULATORY_CARE_PROVIDER_SITE_OTHER): Payer: Medicare Other

## 2022-01-04 DIAGNOSIS — N186 End stage renal disease: Secondary | ICD-10-CM

## 2022-01-04 DIAGNOSIS — Z95820 Peripheral vascular angioplasty status with implants and grafts: Secondary | ICD-10-CM

## 2022-01-09 ENCOUNTER — Ambulatory Visit (INDEPENDENT_AMBULATORY_CARE_PROVIDER_SITE_OTHER): Payer: Medicare Other | Admitting: Urology

## 2022-01-09 ENCOUNTER — Other Ambulatory Visit: Payer: Self-pay

## 2022-01-09 VITALS — BP 168/85 | HR 85

## 2022-01-09 DIAGNOSIS — N8111 Cystocele, midline: Secondary | ICD-10-CM | POA: Diagnosis not present

## 2022-01-09 NOTE — Progress Notes (Signed)
? ?01/09/2022 ?9:37 AM  ? ?Tedra Coupe ?Oct 02, 1941 ?[id number] ? ?Referring provider: Marguerita Merles, MD ?[address] ?[place],  [place] [address] ? ?Chief Complaint  ?Patient presents with  ? Follow-up  ? ? ?HPI: ?I was consulted to assess the patient's prolapse.  She feels vaginal bulging and sometimes reduces it.  She is not had a hysterectomy. ? ?She sometimes leaks with coughing sneezing but not bending lifting.  Sometimes she has urge incontinence.  She wears 2 pads a day that are damp.  She does not have enuresis. ? ?Her flow is sometimes good sometimes poor ? ?She voids 3 times a day and 3 times at night ? ?She has been on hemodialysis for 2 years.  There was thought that she might be on the transplant list but she does not know any further detail ? ?She has not had bladder surgery.  She has had surgery for a kidney stone years ago.  She does not get bladder infections.  She is to be a diabetic and that she lost weight.  Bowel function normal   ?  ?Pelvic examination patient had complete uterine vault prolapse.  She lost the majority of her length anteriorly and only had approximately 3 cm in length posteriorly at full descensus.  The cervix look normal.  It was easy to reduce the prolapse.  She had a mild cystocele with the apex reduced and some shortening of the anterior vaginal wall.  She had no distal rectocele with a vault  ?  ?Patient has mild mixed incontinence.  She has uterine vault prolapse.  She is on hemodialysis.  She has some medical morbidities.  She had a CT scan in 2020 demonstrating no hydronephrosis and severe right renal scarring and atrophy.  She had a normal left kidney.  Picture drawn.  Role of repeat CT scan to assess for silent hydronephrosis discussed.  Role of urodynamics discussed.  I mentioned a robotic sacrocolpopexy with hysterectomy likely the best operation for her.  She would not need a distal rectocele repair.  She would need medical clearance but it appears she is in  reasonable health recognizing she is on chronic.  She will need leak point pressures with and without the prolapse reduced during urodynamics.  Pessary and watchful waiting discussed.  She would like to pursue treatment because it is uncomfortable to sit.  She is a very nice patient I think she understood things well today and that eventually surgery would be done in [place] by my partner ?  ?Today ?Frequency stable.  Prolapse stable.  I reviewed the CT scan.  She had a small scarred right kidney.  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  She had stones.  Left kidney normal.  She had not had urodynamics for unknown reason ?  ?Picture was drawn.  Follow left kidney conservatively.  I will make sure that the urodynamics are done.  I will speak to my staff.  I will get the patient to eventually follow-up to Dr. Louis Meckel. ?  ?Today ?Frequency stable.  Prolapse stable.  On urodynamics patient had not voided and was catheterized for a few milliliters.  Maximum bladder capacity was 250 mL.  Bladder was unstable reaching pressures of 77 cm of water.  She felt urgency and leaked a moderate amount with the contractions.  The bladder overactivity was impressive and repetitive.  Some of the contractions were triggered by Valsalva.  It was difficult to get enough fluid in  her bladder to get leak point pressures.  She was having a lot of bladder overactivity with leakage.  No stress incontinence with a Valsalva pressure 75 cm water with prolapse reduced.  During voluntary voiding she voided 63 mL.  Maximum voiding pressure was 13 cm water.  Residual was 135 mL.  I do believe she had an after contraction noted reaching a pressure 33 cm of water.  Bladder neck descended 2 to 3 cm.  Some of the contraction she did not feel another she felt a lot of urgency.  He was actually triggering with positional changes as well.  The details of the urodynamics are signed and dictated ?    ?Patient has uterine vault  prolapse.  She will be referred to my partner for robotic sacrocolpopexy.  At baseline she has mild mixed incontinence with based on upon urodynamics she has significant overactive bladder.  She understands that surgery could unmask her mixed incontinence.  This could be of different severities.  It could be treated with medical and behavioral therapy or even stress incontinence surgery in the future.  My personal opinion does not to do a sling at the same time as her prolapse repair.  I think should be very pleased that the prolapse is reduced well and we can proceed accordingly if her mixed incontinence continues or worsens.  In a small percentage cases it can improve after prolapse surgery.  I will copy this note into my chart in [facility] for Dr. Louis Meckel ?  ?Conservative management versus pessary versus surgery once again was gone over with patient and daughter but she like to proceed with surgery.  She understands she would need medical clearance.  I will set up the referral ? ?Today ?Frequency stable.  Prolapse stable.  I could not tell why the patient did not see Dr. Louis Meckel.  If I recall a similar lack of follow-up occurred when I ordered the urodynamics. ? ? ?PMH: ?Past Medical History:  ?Diagnosis Date  ? Anemia   ? COVID   ? patient states had COVID "over a month ago and in hospital 3 days"  ? Diabetes mellitus without complication ([place])   ? Gall stone   ? Gout   ? Hyperlipidemia   ? Hypertension   ? Renal disorder   ? kidney disease  ? ? ?Surgical History: ?Past Surgical History:  ?Procedure Laterality Date  ? A/V FISTULAGRAM Left 12/05/2021  ? Procedure: A/V Fistulagram;  Surgeon: Algernon Huxley, MD;  Location: [facility];  Service: Cardiovascular;  Laterality: Left;  ? A/V SHUNTOGRAM Left 02/16/2020  ? Procedure: A/V SHUNTOGRAM;  Surgeon: Algernon Huxley, MD;  Location: [facility];  Service: Cardiovascular;  Laterality: Left;  ? AV FISTULA PLACEMENT Left 01/15/2020  ? Procedure: INSERTION OF  ARTERIOVENOUS (AV) GORE-TEX GRAFT ARM;  Surgeon: Algernon Huxley, MD;  Location: [facility];  Service: Vascular;  Laterality: Left;  ? COLONOSCOPY WITH PROPOFOL N/A 01/27/2016  ? Procedure: COLONOSCOPY WITH PROPOFOL;  Surgeon: Hulen Luster, MD;  Location: [facility];  Service: Gastroenterology;  Laterality: N/A;  ? TUBAL LIGATION    ? ? ?Home Medications:  ?Allergies as of 01/09/2022   ? ?   Reactions  ? Mircera [methoxy Polyethylene Glycol-epoetin Beta]   ? ?  ? ?  ?Medication List  ?  ? ?  ? Accurate as of January 09, 2022  9:37 AM. If you have any questions, ask your nurse or doctor.  ?  ?  ? ?  ? ?  amLODipine 5 MG tablet ?Commonly known as: NORVASC ?Take 5 mg by mouth daily. ?  ?aspirin 81 MG EC tablet ?Take 81 mg by mouth at bedtime. Swallow whole. ?  ?atorvastatin 40 MG tablet ?Commonly known as: LIPITOR ?Take 40 mg by mouth at bedtime. ?  ?colchicine 0.6 MG tablet ?Take 0.6 mg by mouth at bedtime. ?  ?famotidine 10 MG tablet ?Commonly known as: PEPCID ?Take 1 tablet (10 mg total) by mouth daily. ?  ?ferrous sulfate 325 (65 FE) MG tablet ?Take 325 mg by mouth daily. ?  ?lisinopril 40 MG tablet ?Commonly known as: ZESTRIL ?Take 40 mg by mouth daily. ?  ?loratadine 10 MG tablet ?Commonly known as: CLARITIN ?Take 10 mg by mouth every other day. ?  ?multivitamin Tabs tablet ?Take 1 tablet by mouth at bedtime. ?  ?mupirocin ointment 2 % ?Commonly known as: BACTROBAN ?Apply topically 2 (two) times daily. ?  ?ProAir HFA 108 (90 Base) MCG/ACT inhaler ?Generic drug: albuterol ?Inhale 2 puffs into the lungs every 6 (six) hours as needed for shortness of breath. ?  ?sodium bicarbonate 650 MG tablet ?Take 650 mg by mouth 2 (two) times daily. ?  ?torsemide 10 MG tablet ?Commonly known as: DEMADEX ?Take 10 mg by mouth daily. ?  ?vitamin C 500 MG tablet ?Commonly known as: ASCORBIC ACID ?Take 500 mg by mouth daily. ?  ? ?  ? ? ?Allergies:  ?Allergies  ?Allergen Reactions  ? Mircera [Methoxy Polyethylene Glycol-Epoetin Beta]    ? ? ?Family History: ?Family History  ?Problem Relation Age of Onset  ? Breast cancer Sister 69  ? ? ?Social History:  reports that she has quit smoking. She has never used smokeless tobacco. She reports th

## 2022-03-03 ENCOUNTER — Other Ambulatory Visit: Payer: Self-pay | Admitting: Urology

## 2022-05-15 NOTE — Patient Instructions (Signed)
DUE TO SPACE LIMITATIONS, ONLY TWO VISITORS  (aged 79 and older) ARE ALLOWED TO COME WITH YOU AND STAY IN THE WAITING ROOM DURING YOUR PRE OP AND PROCEDURE.   **NO VISITORS ARE ALLOWED IN THE SHORT STAY AREA OR RECOVERY ROOM!!**  IF YOU WILL BE ADMITTED INTO THE HOSPITAL YOU ARE ALLOWED ONLY FOUR SUPPORT PEOPLE DURING VISITATION HOURS (7 AM -8PM)   The support person(s) must pass our screening, and use Hand sanitizing gel. Visitors GUEST BADGE MUST BE WORN VISIBLY  One adult visitor may remain with you overnight and MUST be in the room by 8 P.M.   You are not required to quarantine at this time prior to your surgery. However, you must do this: Hand Hygiene often Do NOT share personal items Notify your provider if you are in close contact with someone who has COVID or you develop fever 100.4 or greater, new onset of sneezing, cough, sore throat, shortness of breath or body aches.       Your procedure is scheduled on:  Friday June 02, 2022  Report to [facility] Main Entrance.  Report to admitting at:  05:15 AM  +++++Call this number if you have any questions or problems the morning of surgery [phone]  Do not eat food :After Midnight the night prior to your surgery/procedure.  After Midnight you may have the following liquids until    04:30 AM DAY OF SURGERY  Clear Liquid Diet Water Black Coffee (sugar ok, NO MILK/CREAM OR CREAMERS)  Tea (sugar ok, NO MILK/CREAM OR CREAMERS) regular and decaf                             Plain Jell-O (NO RED)                                           Fruit ices (not with fruit pulp, NO RED)                                     Popsicles (NO RED)                                                                  Juice: apple, WHITE grape, WHITE cranberry              Limit your liquids as per your usual dialysis regimen, any questions, contact your Dialysis Center.     FOLLOW BOWEL PREP AND ANY ADDITIONAL PRE OP INSTRUCTIONS YOU  RECEIVED FROM YOUR SURGEON'S OFFICE!!!   Oral Hygiene is also important to reduce your risk of infection.        Remember - BRUSH YOUR TEETH THE MORNING OF SURGERY WITH YOUR REGULAR TOOTHPASTE   Take ONLY these medicines the morning of surgery with A SIP OF WATER: Amlodipine, Famotidine, Sodium Bicarbonate, if needed you may use your ProAir inhaler.   Bring CPAP mask and tubing day of surgery.                   You may  not have any metal on your body including hair pins, jewelry, and body piercing  Do not wear make-up, lotions, powders, perfumes, or deodorant  Do not wear nail polish including gel and S&S, artificial / acrylic nails, or any other type of covering on natural nails including finger and toenails. If you have artificial nails, gel coating, etc., that needs to be removed by a nail salon, Please have this removed prior to surgery. Not doing so may mean that your surgery could be cancelled or delayed if the Surgeon or anesthesia staff feels like they are unable to monitor you safely.   Do not shave 48 hours prior to surgery to avoid nicks in your skin which may contribute to postoperative infections.   Contacts, Hearing Aids, dentures or bridgework may not be worn into surgery.   You may bring a small overnight bag with you on the day of surgery, only pack items that are not valuable .[facility] IS NOT RESPONSIBLE   FOR VALUABLES THAT ARE LOST OR STOLEN.   DO NOT [place]. PHARMACY WILL DISPENSE MEDICATIONS LISTED ON YOUR MEDICATION LIST TO YOU DURING YOUR ADMISSION [place]!   Patients discharged on the day of surgery will not be allowed to drive home.  Someone NEEDS to stay with you for the first 24 hours after anesthesia.  Special Instructions: Bring a copy of your healthcare power of attorney and living will documents the day of surgery, if you wish to have them scanned into your [facility] Records- EPIC  Please read over  the following fact sheets you were given: IF YOU HAVE QUESTIONS ABOUT YOUR PRE-OP INSTRUCTIONS, PLEASE CALL [phone]  ([place])   [facility] - Preparing for Surgery Before surgery, you can play an important role.  Because skin is not sterile, your skin needs to be as free of germs as possible.  You can reduce the number of germs on your skin by washing with CHG (chlorahexidine gluconate) soap before surgery.  CHG is an antiseptic cleaner which kills germs and bonds with the skin to continue killing germs even after washing. Please DO NOT use if you have an allergy to CHG or antibacterial soaps.  If your skin becomes reddened/irritated stop using the CHG and inform your nurse when you arrive at Short Stay. Do not shave (including legs and underarms) for at least 48 hours prior to the first CHG shower.  You may shave your face/neck.  Please follow these instructions carefully:  1.  Shower with CHG Soap the night before surgery and the  morning of surgery.  2.  If you choose to wash your hair, wash your hair first as usual with your normal  shampoo.  3.  After you shampoo, rinse your hair and body thoroughly to remove the shampoo.                             4.  Use CHG as you would any other liquid soap.  You can apply chg directly to the skin and wash.  Gently with a scrungie or clean washcloth.  5.  Apply the CHG Soap to your body ONLY FROM THE NECK DOWN.   Do not use on face/ open                           Wound or open sores. Avoid contact with eyes, ears  mouth and genitals (private parts).                       Wash face,  Genitals (private parts) with your normal soap.             6.  Wash thoroughly, paying special attention to the area where your  surgery  will be performed.  7.  Thoroughly rinse your body with warm water from the neck down.  8.  DO NOT shower/wash with your normal soap after using and rinsing off the CHG Soap.            9.  Pat yourself dry with a clean towel.             10.  Wear clean pajamas.            11.  Place clean sheets on your bed the night of your first shower and do not  sleep with pets.  ON THE DAY OF SURGERY : Do not apply any lotions/deodorants the morning of surgery.  Please wear clean clothes to the hospital/surgery center.    FAILURE TO FOLLOW THESE INSTRUCTIONS MAY RESULT IN THE CANCELLATION OF YOUR SURGERY  PATIENT SIGNATURE_________________________________  NURSE SIGNATURE__________________________________  ________________________________________________________________________

## 2022-05-15 NOTE — Progress Notes (Signed)
COVID Vaccine received:  '[]'$  No '[x]'$  Yes  Date of any COVID positive Test in last 90 days:  PCP - Delight Stare, MD Cardiologist -   Chest x-ray - 10-16-21  Epic EKG -  10-21-21  Epic Stress Test -  ECHO -  Cardiac Cath -   Pacemaker/ICD device     '[]'$  N/A Spinal Cord Stimulator:'[]'$  No '[]'$  Yes   Other Implants:   Bowel Prep -   History of Sleep Apnea? '[]'$  No '[]'$  Yes  '[]'$  unknown Sleep Study Date:   CPAP used?- '[]'$  No '[]'$  Yes  (Instruct to bring their mask & Tubing)  Does the patient monitor blood sugar? 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 Patient was also educated of the need to review these PAT instructions again prior to his/her surgery.I reviewed the appropriate phone numbers to call if they have any and questions or concerns.

## 2022-05-17 ENCOUNTER — Encounter (HOSPITAL_COMMUNITY)
Admission: RE | Admit: 2022-05-17 | Discharge: 2022-05-17 | Disposition: A | Discharge status: Home / Self Care | Payer: Medicare Other | Source: Ambulatory Visit | Attending: Anesthesiology | Admitting: Anesthesiology

## 2022-05-18 ENCOUNTER — Encounter: Payer: Self-pay | Admitting: Intensive Care

## 2022-05-18 ENCOUNTER — Emergency Department: Payer: Medicare Other

## 2022-05-18 ENCOUNTER — Other Ambulatory Visit: Payer: Self-pay

## 2022-05-18 ENCOUNTER — Emergency Department
Admission: EM | Admit: 2022-05-18 | Discharge: 2022-05-18 | Disposition: A | Discharge status: Home / Self Care | Payer: Medicare Other | Attending: Emergency Medicine | Admitting: Emergency Medicine

## 2022-05-18 DIAGNOSIS — S92414A Nondisplaced fracture of proximal phalanx of right great toe, initial encounter for closed fracture: Secondary | ICD-10-CM | POA: Diagnosis not present

## 2022-05-18 DIAGNOSIS — W228XXA Striking against or struck by other objects, initial encounter: Secondary | ICD-10-CM | POA: Diagnosis not present

## 2022-05-18 DIAGNOSIS — I120 Hypertensive chronic kidney disease with stage 5 chronic kidney disease or end stage renal disease: Secondary | ICD-10-CM | POA: Diagnosis not present

## 2022-05-18 DIAGNOSIS — N185 Chronic kidney disease, stage 5: Secondary | ICD-10-CM | POA: Insufficient documentation

## 2022-05-18 DIAGNOSIS — Z8616 Personal history of COVID-19: Secondary | ICD-10-CM | POA: Insufficient documentation

## 2022-05-18 DIAGNOSIS — E1122 Type 2 diabetes mellitus with diabetic chronic kidney disease: Secondary | ICD-10-CM | POA: Insufficient documentation

## 2022-05-18 DIAGNOSIS — S99921A Unspecified injury of right foot, initial encounter: Secondary | ICD-10-CM | POA: Diagnosis present

## 2022-05-18 MED ORDER — ACETAMINOPHEN 325 MG PO TABS
650.0000 mg | ORAL_TABLET | Freq: Once | ORAL | Status: AC
  Administered 2022-05-18: 650 mg via ORAL
  Filled 2022-05-18: qty 2

## 2022-05-18 NOTE — Discharge Instructions (Addendum)
You may continue to wear your hard soled shoe while walking around, please remove it at night.  Please follow-up with the podiatrist.  You may rest, ice, elevate your foot.  Take Tylenol 650 mg every 6-8 hours as needed for pain.  Return for any new, worsening, or change in symptoms or other concerns.

## 2022-05-18 NOTE — ED Provider Notes (Signed)
[facility] Provider Note    Event Date/Time   First MD Initiated Contact with Patient 05/18/22 1158     (approximate)   History   Toe Injury   HPI  Krista Gordon is a 81 y.o. female with a past medical history of hypertension, CKD stage III, hyperlipidemia who presents today for evaluation of right great toe pain.  Patient reports that she stubbed it yesterday on a stair.  She did not fall to the ground.  She is unable to walk without difficulty.  No head strike or LOC.  She continues to have pain today.  She denies numbness or tingling.  She has not noticed any skin color changes.  She is still able to ambulate.  She has not taken anything for pain.  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/2018   Nephrolithiasis 06/20/2018   Chronic kidney disease, stage III (moderate) ([place]) 06/20/2018   Diabetes ([place]) 06/20/2018   Essential hypertension 06/20/2018   Hematuria 06/20/2018   HLD (hyperlipidemia) 06/20/2018   Proteinuria 06/20/2018          Physical Exam   Triage Vital Signs: ED Triage Vitals  Enc Vitals Group     BP 05/18/22 1145 120/65     Pulse Rate 05/18/22 1145 84     Resp 05/18/22 1145 16     Temp 05/18/22 1145 98.6 F (37 C)     Temp Source 05/18/22 1145 Oral     SpO2 05/18/22 1145 97 %     Weight 05/18/22 1149 136 lb (61.7 kg)     Height 05/18/22 1149 '5\' 2"'$  (1.575 m)     Head Circumference --      Peak Flow --      Pain Score 05/18/22 1149 1     Pain Loc --      Pain Edu? --      Excl. in [place]? --     Most recent vital signs: Vitals:   05/18/22 1145  BP: 120/65  Pulse: 84  Resp: 16  Temp: 98.6 F (37 C)  SpO2: 97%    Physical Exam Vitals and nursing note reviewed.  Constitutional:      General: Awake and alert. No acute distress.    Appearance: Normal appearance. The patient is normal weight.  HENT:      Head: Normocephalic and atraumatic.     Mouth: Mucous membranes are moist.  Eyes:     General: PERRL. Normal EOMs        Right eye: No discharge.        Left eye: No discharge.     Conjunctiva/sclera: Conjunctivae normal.  Cardiovascular:     Rate and Rhythm: Normal rate and regular rhythm.     Pulses: Normal pulses.     Heart sounds: Normal heart sounds Pulmonary:     Effort: Pulmonary effort is normal. No respiratory distress.     Breath sounds: Normal breath sounds.  Abdominal:     Abdomen is soft. There is no abdominal tenderness. No rebound or guarding. No distention. Musculoskeletal:        General: No swelling. Normal range of motion.     Cervical back: Normal range of motion and neck supple.  Right foot: No obvious deformity, swelling, ecchymosis, or erythema.  No open wounds.  No nail disruption.  No subungual hematoma.  Patient has  mild tenderness to palpation to her proximal phalanx.  Able to flex and extend all toes.  Normal pedal pulses.  Sensation intact light touch throughout.  No medial or lateral malleoli or tenderness. Skin:    General: Skin is warm and dry.     Capillary Refill: Capillary refill takes less than 2 seconds.     Findings: No rash.  Neurological:     Mental Status: The patient is awake and alert.      ED Results / Procedures / Treatments   Labs (all labs ordered are listed, but only abnormal results are displayed) Labs Reviewed - No data to display   EKG     RADIOLOGY I independently reviewed and interpreted imaging and agree with radiologists findings.     PROCEDURES:  Critical Care performed:   Procedures   MEDICATIONS ORDERED IN ED: Medications  acetaminophen (TYLENOL) tablet 650 mg (650 mg Oral Given 05/18/22 1217)     IMPRESSION / MDM / ASSESSMENT AND PLAN / ED COURSE  I reviewed the triage vital signs and the nursing notes.   Differential diagnosis includes, but is not limited to, fracture, contusion, dislocation.   Patient is awake and alert, hemodynamically stable and afebrile.  She demonstrates no acute distress.  She has normal distal pulses.  There is no skin color changes.  Her foot is warm and well-perfused.  She has mild tenderness to palpation along her great toe without swelling or obvious injury.  No subungual hematoma, open wounds, or nail disruption.  X-ray demonstrates a proximal phalanx fracture with intra-articular extension.  Her toes were buddy taped and she was placed in a hard sole shoe.  She was instructed to follow-up with podiatry.  We discussed rest, ice, elevation.  She was given Tylenol with good effect.  Discussed return precautions and the importance of close outpatient follow-up.  Patient understands and agrees with plan.  Discharged in stable condition.   Patient's presentation is most consistent with acute illness / injury with system symptoms.       FINAL CLINICAL IMPRESSION(S) / ED DIAGNOSES   Final diagnoses:  Closed nondisplaced fracture of proximal phalanx of right great toe, initial encounter     Rx / DC Orders   ED Discharge Orders     None        Note:  This document was prepared using Dragon voice recognition software and may include unintentional dictation errors.   Emeline Gins 05/18/22 1307    Carrie Mew, MD 05/18/22 1840

## 2022-05-18 NOTE — ED Triage Notes (Signed)
Patient reports stubbing toe on concrete. Right foot, great toe

## 2022-05-22 NOTE — Patient Instructions (Signed)
SURGICAL WAITING ROOM VISITATION Patients having surgery or a procedure may have no more than 2 support people in the waiting area - these visitors may rotate.   Children under the age of 29 must have an adult with them who is not the patient. If the patient needs to stay at the hospital during part of their recovery, the visitor guidelines for inpatient rooms apply. Pre-op nurse will coordinate an appropriate time for 1 support person to accompany patient in pre-op.  This support person may not rotate.    Please refer to the [facility] website for the visitor guidelines for Inpatients (after your surgery is over and you are in a regular room).    Your procedure is scheduled on: 06/02/22   Report to [facility] Main Entrance    Report to admitting at 5:15 AM   Call this number if you have problems the morning of surgery [phone]   Do not eat food :After Midnight.   After Midnight you may have the following liquids until 4:30 AM DAY OF SURGERY  Water Non-Citrus Juices (without pulp, NO RED) Carbonated Beverages Black Coffee (NO MILK/CREAM OR CREAMERS, sugar ok)  Clear Tea (NO MILK/CREAM OR CREAMERS, sugar ok) regular and decaf                             Plain Jell-O (NO RED)                                           Fruit ices (not with fruit pulp, NO RED)                                     Popsicles (NO RED)                                                               Sports drinks like Gatorade (NO RED)   FOLLOW BOWEL PREP AND ANY ADDITIONAL PRE OP INSTRUCTIONS YOU RECEIVED FROM YOUR SURGEON'S OFFICE!!!     Oral Hygiene is also important to reduce your risk of infection.                                    Remember - BRUSH YOUR TEETH THE MORNING OF SURGERY WITH YOUR REGULAR TOOTHPASTE   Take these medicines the morning of surgery with A SIP OF WATER: Amlodipine, Famotidine, Sodium bicarbonate   How to Manage Your Diabetes Before and After Surgery  Why is it  important to control my blood sugar before and after surgery? Improving blood sugar levels before and after surgery helps healing and can limit problems. A way of improving blood sugar control is eating a healthy diet by:  Eating less sugar and carbohydrates  Increasing activity/exercise  Talking with your doctor about reaching your blood sugar goals High blood sugars (greater than 180 mg/dL) can raise your risk of infections and slow your recovery, so you will need to focus on controlling your diabetes during  the weeks before surgery. Make sure that the doctor who takes care of your diabetes knows about your planned surgery including the date and location.  How do I manage my blood sugar before surgery? Check your blood sugar at least 4 times a day, starting 2 days before surgery, to make sure that the level is not too high or low. Check your blood sugar the morning of your surgery when you wake up and every 2 hours until you get to the Short Stay unit. If your blood sugar is less than 70 mg/dL, you will need to treat for low blood sugar: Do not take insulin. Treat a low blood sugar (less than 70 mg/dL) with  cup of clear juice (cranberry or apple), 4 glucose tablets, OR glucose gel. Recheck blood sugar in 15 minutes after treatment (to make sure it is greater than 70 mg/dL). If your blood sugar is not greater than 70 mg/dL on recheck, call [phone] for further instructions. Report your blood sugar to the short stay nurse when you get to Short Stay.  If you are admitted to the hospital after surgery: Your blood sugar will be checked by the staff and you will probably be given insulin after surgery (instead of oral diabetes medicines) to make sure you have good blood sugar levels. The goal for blood sugar control after surgery is 80-180 mg/dL.  Reviewed and Endorsed by [facility] Patient Education Committee, August 2015                               You may not have any metal on your  body including hair pins, jewelry, and body piercing             Do not wear make-up, lotions, powders, perfumes, or deodorant  Do not wear nail polish including gel and S&S, artificial/acrylic nails, or any other type of covering on natural nails including finger and toenails. If you have artificial nails, gel coating, etc. that needs to be removed by a nail salon please have this removed prior to surgery or surgery may need to be canceled/ delayed if the surgeon/ anesthesia feels like they are unable to be safely monitored.   Do not shave  48 hours prior to surgery.               Men may shave face and neck.   Do not bring valuables to the hospital. [place].   Contacts, dentures or bridgework may not be worn into surgery.   Bring small overnight bag day of surgery.   DO NOT [place]. PHARMACY WILL DISPENSE MEDICATIONS LISTED ON YOUR MEDICATION LIST TO YOU DURING YOUR ADMISSION [place]!   Special Instructions: Bring a copy of your healthcare power of attorney and living will documents         the day of surgery if you haven't scanned them before.              Please read over the following fact sheets you were given: IF YOU HAVE QUESTIONS ABOUT YOUR PRE-OP INSTRUCTIONS PLEASE CALL [facility] - Preparing for Surgery Before surgery, you can play an important role.  Because skin is not sterile, your skin needs to be as free of germs as possible.  You can reduce the number of germs on your skin by washing with CHG (chlorahexidine gluconate) soap before surgery.  CHG is an antiseptic cleaner which kills germs and bonds with the skin to continue killing germs even after washing. Please DO NOT use if you have an allergy to CHG or antibacterial soaps.  If your skin becomes reddened/irritated stop using the CHG and inform your nurse when you arrive at Short Stay. Do not shave  (including legs and underarms) for at least 48 hours prior to the first CHG shower.  You may shave your face/neck.  Please follow these instructions carefully:  1.  Shower with CHG Soap the night before surgery and the  morning of surgery.  2.  If you choose to wash your hair, wash your hair first as usual with your normal  shampoo.  3.  After you shampoo, rinse your hair and body thoroughly to remove the shampoo.                             4.  Use CHG as you would any other liquid soap.  You can apply chg directly to the skin and wash.  Gently with a scrungie or clean washcloth.  5.  Apply the CHG Soap to your body ONLY FROM THE NECK DOWN.   Do   not use on face/ open                           Wound or open sores. Avoid contact with eyes, ears mouth and   genitals (private parts).                       Wash face,  Genitals (private parts) with your normal soap.             6.  Wash thoroughly, paying special attention to the area where your    surgery  will be performed.  7.  Thoroughly rinse your body with warm water from the neck down.  8.  DO NOT shower/wash with your normal soap after using and rinsing off the CHG Soap.                9.  Pat yourself dry with a clean towel.            10.  Wear clean pajamas.            11.  Place clean sheets on your bed the night of your first shower and do not  sleep with pets. Day of Surgery : Do not apply any lotions/deodorants the morning of surgery.  Please wear clean clothes to the hospital/surgery center.  FAILURE TO FOLLOW THESE INSTRUCTIONS MAY RESULT IN THE CANCELLATION OF YOUR SURGERY  PATIENT SIGNATURE_________________________________  NURSE SIGNATURE__________________________________  ________________________________________________________________________  WHAT IS A BLOOD TRANSFUSION? Blood Transfusion Information  A transfusion is the replacement of blood or some of its parts. Blood is made up of multiple cells which provide  different functions. Red blood cells carry oxygen and are used for blood loss replacement. White blood cells fight against infection. Platelets control bleeding. Plasma helps clot blood. Other blood products are available for specialized needs, such as hemophilia or other clotting disorders. BEFORE THE TRANSFUSION  Who gives blood for transfusions?  Healthy volunteers who are fully evaluated to make sure their blood is safe. This is blood bank blood. Transfusion therapy  is the safest it has ever been in the practice of medicine. Before blood is taken from a donor, a complete history is taken to make sure that person has no history of diseases nor engages in risky social behavior (examples are intravenous drug use or sexual activity with multiple partners). The donor's travel history is screened to minimize risk of transmitting infections, such as malaria. The donated blood is tested for signs of infectious diseases, such as HIV and hepatitis. The blood is then tested to be sure it is compatible with you in order to minimize the chance of a transfusion reaction. If you or a relative donates blood, this is often done in anticipation of surgery and is not appropriate for emergency situations. It takes many days to process the donated blood. RISKS AND COMPLICATIONS Although transfusion therapy is very safe and saves many lives, the main dangers of transfusion include:  Getting an infectious disease. Developing a transfusion reaction. This is an allergic reaction to something in the blood you were given. Every precaution is taken to prevent this. The decision to have a blood transfusion has been considered carefully by your caregiver before blood is given. Blood is not given unless the benefits outweigh the risks. AFTER THE TRANSFUSION Right after receiving a blood transfusion, you will usually feel much better and more energetic. This is especially true if your red blood cells have gotten low (anemic).  The transfusion raises the level of the red blood cells which carry oxygen, and this usually causes an energy increase. The nurse administering the transfusion will monitor you carefully for complications. HOME CARE INSTRUCTIONS  No special instructions are needed after a transfusion. You may find your energy is better. Speak with your caregiver about any limitations on activity for underlying diseases you may have. SEEK MEDICAL CARE IF:  Your condition is not improving after your transfusion. You develop redness or irritation at the intravenous (IV) site. SEEK IMMEDIATE MEDICAL CARE IF:  Any of the following symptoms occur over the next 12 hours: Shaking chills. You have a temperature by mouth above 102 F (38.9 C), not controlled by medicine. Chest, back, or muscle pain. People around you feel you are not acting correctly or are confused. Shortness of breath or difficulty breathing. Dizziness and fainting. You get a rash or develop hives. You have a decrease in urine output. Your urine turns a dark color or changes to pink, red, or brown. Any of the following symptoms occur over the next 10 days: You have a temperature by mouth above 102 F (38.9 C), not controlled by medicine. Shortness of breath. Weakness after normal activity. The white part of the eye turns yellow (jaundice). You have a decrease in the amount of urine or are urinating less often. Your urine turns a dark color or changes to pink, red, or brown. Document Released: 09/29/2000 Document Revised: 12/25/2011 Document Reviewed: 05/18/2008 [facility] Patient Information 2014 [place], [place].  _______________________________________________________________________

## 2022-05-23 ENCOUNTER — Encounter (HOSPITAL_COMMUNITY)
Admission: RE | Admit: 2022-05-23 | Discharge: 2022-05-23 | Disposition: A | Discharge status: Home / Self Care | Payer: Medicare Other | Source: Ambulatory Visit | Attending: Urology | Admitting: Urology

## 2022-05-23 ENCOUNTER — Encounter (HOSPITAL_COMMUNITY): Payer: Self-pay

## 2022-05-23 DIAGNOSIS — Z01818 Encounter for other preprocedural examination: Secondary | ICD-10-CM | POA: Diagnosis present

## 2022-05-23 LAB — CBC
HCT: 40.2 % (ref 36.0–46.0)
Hemoglobin: 11.8 g/dL — ABNORMAL LOW (ref 12.0–15.0)
MCH: 30.1 pg (ref 26.0–34.0)
MCHC: 29.4 g/dL — ABNORMAL LOW (ref 30.0–36.0)
MCV: 102.6 fL — ABNORMAL HIGH (ref 80.0–100.0)
Platelets: 150 10*3/uL (ref 150–400)
RBC: 3.92 MIL/uL (ref 3.87–5.11)
RDW: 14.4 % (ref 11.5–15.5)
WBC: 6.8 10*3/uL (ref 4.0–10.5)
nRBC: 0 % (ref 0.0–0.2)

## 2022-05-23 LAB — GLUCOSE, CAPILLARY: Glucose-Capillary: 92 mg/dL (ref 70–99)

## 2022-05-23 NOTE — Progress Notes (Addendum)
COVID Vaccine Completed: yes  Date of COVID positive in last 90 days: no  PCP - Rhina Brackett, MD Cardiologist - n/a  Chest x-ray - 10/16/21 Epic EKG - 10/21/21 Epic Stress Test - n/a ECHO - n/a Cardiac Cath - n/a Pacemaker/ICD device last checked: n/a Spinal Cord Stimulator: n/a  Bowel Prep - light diet the day before  Sleep Study - yes, negative CPAP -   Fasting Blood Sugar - no longer per pt after she lost weight. No checks at home and no medications Checks Blood Sugar _____ times a day  Blood Thinner Instructions: Aspirin Instructions: ASA 81, hold 5-7 days Last Dose:  Activity level: Can go up a flight of stairs and perform activities of daily living without stopping and without symptoms of chest pain or shortness of breath.    Anesthesia review: ESRD on HD TThS, DM, HTN, broke rigth big toe 05/18/22 (ED encounter), open wound to left upper/inner thigh, pt reports wound has pus draining, is warm to the touch, and is painful  Patient denies shortness of breath, fever, cough and chest pain at PAT appointment  Patient verbalized understanding of instructions that were given to them at the PAT appointment. Patient was also instructed that they will need to review over the PAT instructions again at home before surgery.

## 2022-05-23 NOTE — Progress Notes (Signed)
During PAT appointment patient reported that she has a boil to her left upper/inner thigh that opened up a few weeks ago. She reports it has pus coming out of it, is warm to the touch, and is painful. She has been putting Neosporin on it and has been leaving it uncovered. Called Dr. Carlton Adam office and informed Krista Gordon. She will alert Dr. Louis Meckel. Also instructed patient to call her PCP. Patient verbalized understanding.

## 2022-05-24 LAB — URINE CULTURE

## 2022-05-24 NOTE — Progress Notes (Signed)
Urine culture with 'many species present' . Results routed to Dr. Louis Meckel

## 2022-05-25 NOTE — Progress Notes (Signed)
Anesthesia Chart Review:   Case: [id number] Date/Time: 06/02/22 0715   Procedure: XI ROBOTIC ASSISTED LAPAROSCOPIC SACROCOLPOPEXY SUPRACEVICAL HYSTERECTOMY AND BILATERAL SALPINGO OOPHERECTOMY (Bilateral) - 4.5 HRS FOR THIS CASE   Anesthesia type: General   Pre-op diagnosis: PELVIC ORGAN PROLAPSE   Location: [facility] ROOM 03 / [facility]   Surgeons: Ardis Hughs, MD       DISCUSSION: Pt is 81 years old with hx HTN, DM (pt reports no longer has DM after weight loss), ESRD on hemodialyiss (TTS), anemia  At pre-surgical testing, patient reported a boil to her left upper/inner thigh that opened up a few weeks ago. She reports it has pus coming out of it, is warm to the touch, and is painful. She has been putting Neosporin on it and has been leaving it uncovered. RN called Dr. Carlton Adam office and informed Virginia Crews who will alert Dr. Louis Meckel. Also instructed patient to call her PCP.  VS: BP 133/75   Pulse 84   Temp 37 C (Oral)   Resp 14   Ht '5\' 2"'$  (1.575 m)   Wt 56.2 kg   SpO2 95%   BMI 22.68 kg/m   PROVIDERS: - PCP is Marguerita Merles, MD   LABS: Labs reviewed: Acceptable for surgery. (all labs ordered are listed, but only abnormal results are displayed)  Labs Reviewed  URINE CULTURE - Abnormal; Notable for the following components:      Result Value   Culture MULTIPLE SPECIES PRESENT, SUGGEST RECOLLECTION (*)    All other components within normal limits  CBC - Abnormal; Notable for the following components:   Hemoglobin 11.8 (*)    MCV 102.6 (*)    MCHC 29.4 (*)    All other components within normal limits  GLUCOSE, CAPILLARY  TYPE AND SCREEN     IMAGES: CXR 10/16/21:  - Cardiomegaly with diffuse interstitial prominence. While this may represent edema, viral infection is also considered  EKG 10/16/21:  NSR When compared with ECG of 13-Jan-2020 08:50, Questionable change in QRS axis Nonspecific T wave abnormality now evident in Anterior leads  CV: N/A  Past Medical  History:  Diagnosis Date   Anemia    COVID    patient states had COVID "over a month ago and in hospital 3 days"   Diabetes mellitus without complication ([place])    Gall stone    Gout    Hyperlipidemia    Hypertension    Renal disorder    kidney disease    Past Surgical History:  Procedure Laterality Date   A/V FISTULAGRAM Left 12/05/2021   Procedure: A/V Fistulagram;  Surgeon: Algernon Huxley, MD;  Location: [place];  Service: Cardiovascular;  Laterality: Left;   A/V SHUNTOGRAM Left 02/16/2020   Procedure: A/V SHUNTOGRAM;  Surgeon: Algernon Huxley, MD;  Location: [place] [facility];  Service: Cardiovascular;  Laterality: Left;   AV FISTULA PLACEMENT Left 01/15/2020   Procedure: INSERTION OF ARTERIOVENOUS (AV) GORE-TEX GRAFT ARM;  Surgeon: Algernon Huxley, MD;  Location: [facility] ORS;  Service: Vascular;  Laterality: Left;   COLONOSCOPY WITH PROPOFOL N/A 01/27/2016   Procedure: COLONOSCOPY WITH PROPOFOL;  Surgeon: Hulen Luster, MD;  [facility] END[facility];  Service: Gastroenterology;  Laterality: N/A;   TUBAL LIGATION      MEDICATIONS:  amLODipine (NORVASC) 5 MG tablet   aspirin 81 MG EC tablet   atorvastatin (LIPITOR) 40 MG tablet   colchicine 0.6 MG tablet   famotidine (PEPCID) 10 MG tablet  ferrous sulfate 325 (65 FE) MG tablet   lisinopril (ZESTRIL) 40 MG tablet   loratadine (CLARITIN) 10 MG tablet   multivitamin (RENA-VIT) TABS tablet   mupirocin ointment (BACTROBAN) 2 %   PROAIR HFA 108 (90 Base) MCG/ACT inhaler   sodium bicarbonate 650 MG tablet   No current facility-administered medications for this encounter.    If open upper thigh wound/infection not an impediment to surgery per Dr. Louis Meckel, and no acute changes, I anticipate pt can proceed with surgery.   Willeen Cass, PhD, FNP-BC [facility]/Anesthesiology Phone: [phone] 05/25/2022 12:14 PM'

## 2022-05-25 NOTE — Anesthesia Preprocedure Evaluation (Addendum)
Anesthesia Evaluation  Patient identified by MRN, date of birth, ID band Patient awake    Reviewed: Allergy & Precautions, NPO status , Patient's Chart, lab work & pertinent test results  History of Anesthesia Complications Negative for: history of anesthetic complications  Airway Mallampati: II  TM Distance: >3 FB Neck ROM: Full    Dental  (+) Edentulous Lower, Edentulous Upper   Pulmonary former smoker,    Pulmonary exam normal        Cardiovascular hypertension, Pt. on medications Normal cardiovascular exam     Neuro/Psych negative neurological ROS  negative psych ROS   GI/Hepatic negative GI ROS, Neg liver ROS,   Endo/Other  diabetes, Type 2  Renal/GU Dialysis and ESRFRenal disease (HD T/Th/Sa)  negative genitourinary   Musculoskeletal negative musculoskeletal ROS (+)   Abdominal   Peds  Hematology  (+) Blood dyscrasia (Hgb 11.8), anemia ,   Anesthesia Other Findings Day of surgery medications reviewed with patient.  Reproductive/Obstetrics PELVIC ORGAN PROLAPSE                           Anesthesia Physical Anesthesia Plan  ASA: 3  Anesthesia Plan: General   Post-op Pain Management: Tylenol PO (pre-op)*   Induction: Intravenous  PONV Risk Score and Plan: 3 and Treatment may vary due to age or medical condition, Ondansetron and Dexamethasone  Airway Management Planned: Oral ETT  Additional Equipment: ClearSight  Intra-op Plan:   Post-operative Plan: Extubation in OR  Informed Consent: I have reviewed the patients History and Physical, chart, labs and discussed the procedure including the risks, benefits and alternatives for the proposed anesthesia with the patient or authorized representative who has indicated his/her understanding and acceptance.     Dental advisory given  Plan Discussed with: CRNA  Anesthesia Plan Comments: (Plan to use Clearsight (arterial line if  not available). Daiva Huge, MD)      Anesthesia Quick Evaluation

## 2022-06-02 ENCOUNTER — Other Ambulatory Visit: Payer: Self-pay

## 2022-06-02 ENCOUNTER — Observation Stay (HOSPITAL_COMMUNITY)
Admission: RE | Admit: 2022-06-02 | Discharge: 2022-06-03 | Disposition: A | Discharge status: Home / Self Care | Payer: Medicare Other | Source: Ambulatory Visit | Attending: Urology | Admitting: Urology

## 2022-06-02 ENCOUNTER — Encounter (HOSPITAL_COMMUNITY)
Admission: RE | Disposition: A | Discharge status: Home / Self Care | Payer: Self-pay | Source: Ambulatory Visit | Attending: Urology

## 2022-06-02 ENCOUNTER — Encounter (HOSPITAL_COMMUNITY): Payer: Self-pay | Admitting: Urology

## 2022-06-02 ENCOUNTER — Ambulatory Visit (HOSPITAL_BASED_OUTPATIENT_CLINIC_OR_DEPARTMENT_OTHER): Payer: Medicare Other | Admitting: Registered Nurse

## 2022-06-02 ENCOUNTER — Ambulatory Visit (HOSPITAL_COMMUNITY): Payer: Medicare Other | Admitting: Physician Assistant

## 2022-06-02 DIAGNOSIS — E1122 Type 2 diabetes mellitus with diabetic chronic kidney disease: Secondary | ICD-10-CM | POA: Insufficient documentation

## 2022-06-02 DIAGNOSIS — Z79899 Other long term (current) drug therapy: Secondary | ICD-10-CM | POA: Insufficient documentation

## 2022-06-02 DIAGNOSIS — I120 Hypertensive chronic kidney disease with stage 5 chronic kidney disease or end stage renal disease: Secondary | ICD-10-CM | POA: Insufficient documentation

## 2022-06-02 DIAGNOSIS — Z992 Dependence on renal dialysis: Secondary | ICD-10-CM | POA: Insufficient documentation

## 2022-06-02 DIAGNOSIS — N8111 Cystocele, midline: Secondary | ICD-10-CM | POA: Insufficient documentation

## 2022-06-02 DIAGNOSIS — N3946 Mixed incontinence: Secondary | ICD-10-CM | POA: Insufficient documentation

## 2022-06-02 DIAGNOSIS — Z8616 Personal history of COVID-19: Secondary | ICD-10-CM | POA: Insufficient documentation

## 2022-06-02 DIAGNOSIS — A419 Sepsis, unspecified organism: Secondary | ICD-10-CM | POA: Diagnosis not present

## 2022-06-02 DIAGNOSIS — Z794 Long term (current) use of insulin: Secondary | ICD-10-CM

## 2022-06-02 DIAGNOSIS — N186 End stage renal disease: Secondary | ICD-10-CM

## 2022-06-02 DIAGNOSIS — N819 Female genital prolapse, unspecified: Secondary | ICD-10-CM | POA: Diagnosis present

## 2022-06-02 DIAGNOSIS — N8189 Other female genital prolapse: Secondary | ICD-10-CM

## 2022-06-02 DIAGNOSIS — D631 Anemia in chronic kidney disease: Secondary | ICD-10-CM

## 2022-06-02 DIAGNOSIS — R4182 Altered mental status, unspecified: Secondary | ICD-10-CM | POA: Diagnosis not present

## 2022-06-02 HISTORY — PX: ROBOTIC ASSISTED LAPAROSCOPIC SACROCOLPOPEXY: SHX5388

## 2022-06-02 LAB — CBC WITH DIFFERENTIAL/PLATELET
Abs Immature Granulocytes: 0.01 10*3/uL (ref 0.00–0.07)
Basophils Absolute: 0.1 10*3/uL (ref 0.0–0.1)
Basophils Relative: 1 %
Eosinophils Absolute: 0.4 10*3/uL (ref 0.0–0.5)
Eosinophils Relative: 7 %
HCT: 35.7 % — ABNORMAL LOW (ref 36.0–46.0)
Hemoglobin: 10.1 g/dL — ABNORMAL LOW (ref 12.0–15.0)
Immature Granulocytes: 0 %
Lymphocytes Relative: 27 %
Lymphs Abs: 1.7 10*3/uL (ref 0.7–4.0)
MCH: 29.6 pg (ref 26.0–34.0)
MCHC: 28.3 g/dL — ABNORMAL LOW (ref 30.0–36.0)
MCV: 104.7 fL — ABNORMAL HIGH (ref 80.0–100.0)
Monocytes Absolute: 0.6 10*3/uL (ref 0.1–1.0)
Monocytes Relative: 9 %
Neutro Abs: 3.5 10*3/uL (ref 1.7–7.7)
Neutrophils Relative %: 56 %
Platelets: 121 10*3/uL — ABNORMAL LOW (ref 150–400)
RBC: 3.41 MIL/uL — ABNORMAL LOW (ref 3.87–5.11)
RDW: 15.4 % (ref 11.5–15.5)
WBC: 6.2 10*3/uL (ref 4.0–10.5)
nRBC: 0 % (ref 0.0–0.2)

## 2022-06-02 LAB — TYPE AND SCREEN
ABO/RH(D): B POS
Antibody Screen: NEGATIVE

## 2022-06-02 LAB — COMPREHENSIVE METABOLIC PANEL
ALT: 10 U/L (ref 0–44)
AST: 21 U/L (ref 15–41)
Albumin: 3.2 g/dL — ABNORMAL LOW (ref 3.5–5.0)
Alkaline Phosphatase: 82 U/L (ref 38–126)
Anion gap: 8 (ref 5–15)
BUN: 12 mg/dL (ref 8–23)
CO2: 29 mmol/L (ref 22–32)
Calcium: 9 mg/dL (ref 8.9–10.3)
Chloride: 107 mmol/L (ref 98–111)
Creatinine, Ser: 3.79 mg/dL — ABNORMAL HIGH (ref 0.44–1.00)
GFR, Estimated: 12 mL/min — ABNORMAL LOW (ref >60)
Glucose, Bld: 84 mg/dL (ref 70–99)
Potassium: 4 mmol/L (ref 3.5–5.1)
Sodium: 144 mmol/L (ref 135–145)
Total Bilirubin: 0.7 mg/dL (ref 0.3–1.2)
Total Protein: 7 g/dL (ref 6.5–8.1)

## 2022-06-02 LAB — GLUCOSE, CAPILLARY
Glucose-Capillary: 70 mg/dL (ref 70–99)
Glucose-Capillary: 194 mg/dL — ABNORMAL HIGH (ref 70–99)

## 2022-06-02 LAB — HEMOGLOBIN A1C
Hgb A1c MFr Bld: 4 % — ABNORMAL LOW (ref 4.8–5.6)
Mean Plasma Glucose: 68.1 mg/dL

## 2022-06-02 SURGERY — SACROCOLPOPEXY, ROBOT-ASSISTED, LAPAROSCOPIC
Anesthesia: General | Laterality: Bilateral

## 2022-06-02 MED ORDER — HYDROMORPHONE HCL 1 MG/ML IJ SOLN
INTRAMUSCULAR | Status: AC
  Filled 2022-06-02: qty 2

## 2022-06-02 MED ORDER — EPHEDRINE 5 MG/ML INJ
INTRAVENOUS | Status: AC
  Filled 2022-06-02: qty 5

## 2022-06-02 MED ORDER — FENTANYL CITRATE (PF) 100 MCG/2ML IJ SOLN
INTRAMUSCULAR | Status: DC | PRN
Start: 2022-06-02 — End: 2022-06-02
  Administered 2022-06-02 (×2): 50 ug via INTRAVENOUS

## 2022-06-02 MED ORDER — CHLORHEXIDINE GLUCONATE 0.12 % MT SOLN
15.0000 mL | Freq: Once | OROMUCOSAL | Status: AC
  Administered 2022-06-02: 15 mL via OROMUCOSAL

## 2022-06-02 MED ORDER — DEXAMETHASONE SODIUM PHOSPHATE 10 MG/ML IJ SOLN
INTRAMUSCULAR | Status: AC
  Filled 2022-06-02: qty 1

## 2022-06-02 MED ORDER — PHENYLEPHRINE HCL-NACL 20-0.9 MG/250ML-% IV SOLN
INTRAVENOUS | Status: DC | PRN
  Administered 2022-06-02: 25 ug/min via INTRAVENOUS

## 2022-06-02 MED ORDER — ACETAMINOPHEN 500 MG PO TABS
1000.0000 mg | ORAL_TABLET | Freq: Once | ORAL | Status: AC
  Administered 2022-06-02: 1000 mg via ORAL
  Filled 2022-06-02: qty 2

## 2022-06-02 MED ORDER — BUPIVACAINE-EPINEPHRINE (PF) 0.5% -1:200000 IJ SOLN
INTRAMUSCULAR | Status: AC
  Filled 2022-06-02: qty 30

## 2022-06-02 MED ORDER — TRAMADOL HCL 50 MG PO TABS: 50.0000 mg | ORAL_TABLET | Freq: Four times a day (QID) | ORAL | 0 refills | Status: DC | PRN

## 2022-06-02 MED ORDER — BUPIVACAINE LIPOSOME 1.3 % IJ SUSP
INTRAMUSCULAR | Status: DC | PRN
  Administered 2022-06-02: 20 mL

## 2022-06-02 MED ORDER — ROCURONIUM BROMIDE 10 MG/ML (PF) SYRINGE
PREFILLED_SYRINGE | INTRAVENOUS | Status: AC
  Filled 2022-06-02: qty 10

## 2022-06-02 MED ORDER — CIPROFLOXACIN IN D5W 400 MG/200ML IV SOLN
INTRAVENOUS | Status: DC | PRN
  Administered 2022-06-02: 400 mg via INTRAVENOUS

## 2022-06-02 MED ORDER — PHENYLEPHRINE HCL-NACL 20-0.9 MG/250ML-% IV SOLN
INTRAVENOUS | Status: AC
  Filled 2022-06-02: qty 250

## 2022-06-02 MED ORDER — CEPHALEXIN 500 MG PO CAPS
500.0000 mg | ORAL_CAPSULE | Freq: Every day | ORAL | Status: DC
  Administered 2022-06-02: 500 mg via ORAL
  Filled 2022-06-02: qty 1

## 2022-06-02 MED ORDER — ACETAMINOPHEN 10 MG/ML IV SOLN
1000.0000 mg | Freq: Four times a day (QID) | INTRAVENOUS | Status: DC
  Administered 2022-06-02 – 2022-06-03 (×2): 1000 mg via INTRAVENOUS
  Filled 2022-06-02 (×2): qty 100

## 2022-06-02 MED ORDER — DEXAMETHASONE SODIUM PHOSPHATE 10 MG/ML IJ SOLN
INTRAMUSCULAR | Status: DC | PRN
  Administered 2022-06-02: 8 mg via INTRAVENOUS

## 2022-06-02 MED ORDER — PROPOFOL 10 MG/ML IV BOLUS
INTRAVENOUS | Status: AC
Start: 2022-06-02 — End: ?
  Filled 2022-06-02: qty 20

## 2022-06-02 MED ORDER — LIDOCAINE 2% (20 MG/ML) 5 ML SYRINGE
INTRAMUSCULAR | Status: DC | PRN
  Administered 2022-06-02: 80 mg via INTRAVENOUS

## 2022-06-02 MED ORDER — FENTANYL CITRATE (PF) 100 MCG/2ML IJ SOLN
INTRAMUSCULAR | Status: AC
  Filled 2022-06-02: qty 2

## 2022-06-02 MED ORDER — ORAL CARE MOUTH RINSE: 15.0000 mL | Freq: Once | OROMUCOSAL | Status: AC

## 2022-06-02 MED ORDER — ONDANSETRON HCL 4 MG/2ML IJ SOLN
INTRAMUSCULAR | Status: AC
  Filled 2022-06-02: qty 2

## 2022-06-02 MED ORDER — ACETAMINOPHEN 10 MG/ML IV SOLN
1000.0000 mg | Freq: Once | INTRAVENOUS | Status: DC | PRN
Start: 2022-06-02 — End: 2022-06-02
  Administered 2022-06-02: 1000 mg via INTRAVENOUS

## 2022-06-02 MED ORDER — BUPIVACAINE-EPINEPHRINE 0.5% -1:200000 IJ SOLN
INTRAMUSCULAR | Status: DC | PRN
  Administered 2022-06-02: 30 mL

## 2022-06-02 MED ORDER — DROPERIDOL 2.5 MG/ML IJ SOLN: 0.6250 mg | Freq: Once | INTRAMUSCULAR | Status: DC | PRN

## 2022-06-02 MED ORDER — ROCURONIUM BROMIDE 10 MG/ML (PF) SYRINGE
PREFILLED_SYRINGE | INTRAVENOUS | Status: DC | PRN
  Administered 2022-06-02: 50 mg via INTRAVENOUS

## 2022-06-02 MED ORDER — PHENYLEPHRINE 80 MCG/ML (10ML) SYRINGE FOR IV PUSH (FOR BLOOD PRESSURE SUPPORT)
PREFILLED_SYRINGE | INTRAVENOUS | Status: DC | PRN
  Administered 2022-06-02: 120 ug via INTRAVENOUS
  Administered 2022-06-02: 160 ug via INTRAVENOUS
  Administered 2022-06-02: 80 ug via INTRAVENOUS

## 2022-06-02 MED ORDER — CEFAZOLIN SODIUM-DEXTROSE 2-4 GM/100ML-% IV SOLN
2.0000 g | INTRAVENOUS | Status: AC
  Administered 2022-06-02: 2 g via INTRAVENOUS
  Filled 2022-06-02: qty 100

## 2022-06-02 MED ORDER — OXYCODONE HCL 5 MG PO TABS
5.0000 mg | ORAL_TABLET | ORAL | Status: DC | PRN
  Administered 2022-06-03: 5 mg via ORAL
  Filled 2022-06-02: qty 1

## 2022-06-02 MED ORDER — EPHEDRINE SULFATE-NACL 50-0.9 MG/10ML-% IV SOSY
PREFILLED_SYRINGE | INTRAVENOUS | Status: DC | PRN
  Administered 2022-06-02: 5 mg via INTRAVENOUS

## 2022-06-02 MED ORDER — LACTATED RINGERS IR SOLN
Status: DC | PRN
  Administered 2022-06-02: 1000 mL

## 2022-06-02 MED ORDER — PROPOFOL 10 MG/ML IV BOLUS
INTRAVENOUS | Status: DC | PRN
  Administered 2022-06-02: 90 mg via INTRAVENOUS

## 2022-06-02 MED ORDER — ESTRADIOL 0.1 MG/GM VA CREA: 1.0000 | TOPICAL_CREAM | VAGINAL | 1 refills | Status: DC

## 2022-06-02 MED ORDER — HYDROMORPHONE HCL 1 MG/ML IJ SOLN
0.2500 mg | INTRAMUSCULAR | Status: DC | PRN
  Administered 2022-06-02: 0.25 mg via INTRAVENOUS

## 2022-06-02 MED ORDER — ONDANSETRON HCL 4 MG/2ML IJ SOLN: 4.0000 mg | INTRAMUSCULAR | Status: DC | PRN

## 2022-06-02 MED ORDER — LIDOCAINE 2% (20 MG/ML) 5 ML SYRINGE
INTRAMUSCULAR | Status: AC
  Filled 2022-06-02: qty 5

## 2022-06-02 MED ORDER — CEPHALEXIN 500 MG PO CAPS: 500.0000 mg | ORAL_CAPSULE | Freq: Two times a day (BID) | ORAL | Status: DC

## 2022-06-02 MED ORDER — STERILE WATER FOR IRRIGATION IR SOLN
Status: DC | PRN
  Administered 2022-06-02: 1000 mL

## 2022-06-02 MED ORDER — LACTATED RINGERS IV SOLN: INTRAVENOUS | Status: DC

## 2022-06-02 MED ORDER — CEPHALEXIN 500 MG PO CAPS
500.0000 mg | ORAL_CAPSULE | Freq: Two times a day (BID) | ORAL | 0 refills | Status: DC
Start: 2022-06-02 — End: 2022-06-02

## 2022-06-02 MED ORDER — BUPIVACAINE LIPOSOME 1.3 % IJ SUSP
INTRAMUSCULAR | Status: AC
  Filled 2022-06-02: qty 20

## 2022-06-02 MED ORDER — ONDANSETRON HCL 4 MG/2ML IJ SOLN
INTRAMUSCULAR | Status: DC | PRN
  Administered 2022-06-02: 4 mg via INTRAVENOUS

## 2022-06-02 MED ORDER — ESTRADIOL 0.1 MG/GM VA CREA
TOPICAL_CREAM | VAGINAL | Status: DC | PRN
  Administered 2022-06-02: 1 via VAGINAL

## 2022-06-02 MED ORDER — SODIUM CHLORIDE 0.9 % IV SOLN: INTRAVENOUS | Status: DC

## 2022-06-02 MED ORDER — ACETAMINOPHEN 10 MG/ML IV SOLN
INTRAVENOUS | Status: AC
  Filled 2022-06-02: qty 100

## 2022-06-02 MED ORDER — CIPROFLOXACIN IN D5W 400 MG/200ML IV SOLN
INTRAVENOUS | Status: AC
  Filled 2022-06-02: qty 200

## 2022-06-02 MED ORDER — SUGAMMADEX SODIUM 200 MG/2ML IV SOLN
INTRAVENOUS | Status: DC | PRN
  Administered 2022-06-02: 200 mg via INTRAVENOUS

## 2022-06-02 MED ORDER — ESTRADIOL 0.1 MG/GM VA CREA
TOPICAL_CREAM | VAGINAL | Status: AC
Start: 2022-06-02 — End: ?
  Filled 2022-06-02: qty 42.5

## 2022-06-02 SURGICAL SUPPLY — 76 items
ADH SKN CLS APL DERMABOND .7 (GAUZE/BANDAGES/DRESSINGS) ×1
APL PRP STRL LF DISP 70% ISPRP (MISCELLANEOUS) ×1
BAG COUNTER SPONGE SURGICOUNT (BAG) IMPLANT
BAG DRN RND TRDRP ANRFLXCHMBR (UROLOGICAL SUPPLIES) ×1
BAG SPNG CNTER NS LX DISP (BAG)
BAG URINE DRAIN 2000ML AR STRL (UROLOGICAL SUPPLIES) IMPLANT
CATH FOLEY 2WAY SLVR  5CC 16FR (CATHETERS) ×1
CATH FOLEY 2WAY SLVR 5CC 16FR (CATHETERS) ×1 IMPLANT
CHLORAPREP W/TINT 26 (MISCELLANEOUS) ×1 IMPLANT
CLIP LIGATING HEM O LOK PURPLE (MISCELLANEOUS) ×1 IMPLANT
CLIP LIGATING HEMO LOK XL GOLD (MISCELLANEOUS) IMPLANT
CLIP LIGATING HEMO O LOK GREEN (MISCELLANEOUS) IMPLANT
COVER BACK TABLE 60X90IN (DRAPES) ×1 IMPLANT
COVER SURGICAL LIGHT HANDLE (MISCELLANEOUS) ×1 IMPLANT
COVER TIP SHEARS 8 DVNC (MISCELLANEOUS) ×1 IMPLANT
COVER TIP SHEARS 8MM DA VINCI (MISCELLANEOUS) ×1
DERMABOND ADVANCED (GAUZE/BANDAGES/DRESSINGS) ×1
DERMABOND ADVANCED .7 DNX12 (GAUZE/BANDAGES/DRESSINGS) ×1 IMPLANT
DRAIN CHANNEL RND F F (WOUND CARE) IMPLANT
DRAPE ARM DVNC X/XI (DISPOSABLE) ×4 IMPLANT
DRAPE COLUMN DVNC XI (DISPOSABLE) ×1 IMPLANT
DRAPE DA VINCI XI ARM (DISPOSABLE) ×4
DRAPE DA VINCI XI COLUMN (DISPOSABLE) ×1
DRAPE INCISE IOBAN 66X45 STRL (DRAPES) ×1 IMPLANT
DRAPE SHEET LG 3/4 BI-LAMINATE (DRAPES) ×2 IMPLANT
DRAPE SURG IRRIG POUCH 19X23 (DRAPES) ×1 IMPLANT
ELECT PENCIL ROCKER SW 15FT (MISCELLANEOUS) ×1 IMPLANT
ELECT REM PT RETURN 15FT ADLT (MISCELLANEOUS) ×1 IMPLANT
GAUZE 4X4 16PLY ~~LOC~~+RFID DBL (SPONGE) IMPLANT
GLOVE BIO SURGEON STRL SZ 6.5 (GLOVE) ×1 IMPLANT
GLOVE SURG LX 7.5 STRW (GLOVE) ×3
GLOVE SURG LX STRL 7.5 STRW (GLOVE) ×3 IMPLANT
GOWN SRG XL LVL 4 BRTHBL STRL (GOWNS) ×1 IMPLANT
GOWN STRL NON-REIN XL LVL4 (GOWNS) ×4
GOWN STRL REUS W/ TWL XL LVL3 (GOWN DISPOSABLE) ×2 IMPLANT
GOWN STRL REUS W/TWL XL LVL3 (GOWN DISPOSABLE) ×2
HOLDER FOLEY CATH W/STRAP (MISCELLANEOUS) ×1 IMPLANT
IRRIG SUCT STRYKERFLOW 2 WTIP (MISCELLANEOUS) ×1
IRRIGATION SUCT STRKRFLW 2 WTP (MISCELLANEOUS) ×1 IMPLANT
KIT BASIN OR (CUSTOM PROCEDURE TRAY) ×1 IMPLANT
KIT TURNOVER KIT A (KITS) IMPLANT
MANIPULATOR UTERINE 4.5 ZUMI (MISCELLANEOUS) IMPLANT
MARKER SKIN DUAL TIP RULER LAB (MISCELLANEOUS) ×1 IMPLANT
MESH Y UPSYLON VAGINAL (Mesh General) IMPLANT
OCCLUDER COLPOPNEUMO (BALLOONS) IMPLANT
PACKING VAGINAL (PACKING) IMPLANT
PAD OB MATERNITY 4.3X12.25 (PERSONAL CARE ITEMS) IMPLANT
PAD POSITIONING PINK XL (MISCELLANEOUS) ×1 IMPLANT
PANTS MESH DISP LRG (UNDERPADS AND DIAPERS) IMPLANT
SEAL CANN UNIV 5-8 DVNC XI (MISCELLANEOUS) ×4 IMPLANT
SEAL XI 5MM-8MM UNIVERSAL (MISCELLANEOUS) ×4
SET IRRIG Y TYPE TUR BLADDER L (SET/KITS/TRAYS/PACK) IMPLANT
SET TUBE SMOKE EVAC HIGH FLOW (TUBING) ×1 IMPLANT
SHEET LAVH (DRAPES) ×1 IMPLANT
SOL PREP POV-IOD 4OZ 10% (MISCELLANEOUS) IMPLANT
SOLUTION ELECTROLUBE (MISCELLANEOUS) ×1 IMPLANT
SURGILUBE 2OZ TUBE FLIPTOP (MISCELLANEOUS) IMPLANT
SUT MNCRL AB 4-0 PS2 18 (SUTURE) ×2 IMPLANT
SUT PROLENE 2 0 CT 1 (SUTURE) ×1 IMPLANT
SUT VIC AB 0 CT1 27 (SUTURE) ×1
SUT VIC AB 0 CT1 27XBRD ANTBC (SUTURE) ×1 IMPLANT
SUT VIC AB 2-0 SH 27 (SUTURE) ×6
SUT VIC AB 2-0 SH 27XBRD (SUTURE) ×5 IMPLANT
SUT VIC AB 3-0 SH 27 (SUTURE) ×1
SUT VIC AB 3-0 SH 27X BRD (SUTURE) IMPLANT
SUT VICRYL 0 UR6 27IN ABS (SUTURE) ×1 IMPLANT
SUT VLOC 180 3-0 9IN GS21 (SUTURE) IMPLANT
SYR 50ML LL SCALE MARK (SYRINGE) IMPLANT
SYR BULB IRRIG 60ML STRL (SYRINGE) IMPLANT
SYS BAG RETRIEVAL 10MM (BASKET) ×1
SYSTEM BAG RETRIEVAL 10MM (BASKET) IMPLANT
TOWEL OR 17X26 10 PK STRL BLUE (TOWEL DISPOSABLE) ×1 IMPLANT
TRAY LAPAROSCOPIC (CUSTOM PROCEDURE TRAY) ×1 IMPLANT
TROCAR ADV FIXATION 12X100MM (TROCAR) ×1 IMPLANT
TROCAR Z-THREAD FIOS 5X100MM (TROCAR) ×1 IMPLANT
WATER STERILE IRR 1000ML POUR (IV SOLUTION) ×1 IMPLANT

## 2022-06-02 NOTE — Anesthesia Procedure Notes (Signed)
Procedure Name: Intubation Date/Time: 06/02/2022 7:32 AM  Performed by: Victoriano Lain, CRNAPre-anesthesia Checklist: Patient identified, Emergency Drugs available, Suction available, Patient being monitored and Timeout performed Patient Re-evaluated:Patient Re-evaluated prior to induction Oxygen Delivery Method: Circle system utilized Preoxygenation: Pre-oxygenation with 100% oxygen Induction Type: IV induction Ventilation: Mask ventilation without difficulty and Oral airway inserted - appropriate to patient size Laryngoscope Size: Mac and 4 Grade View: Grade I Tube type: Oral Tube size: 7.0 mm Number of attempts: 1 Airway Equipment and Method: Stylet Placement Confirmation: ETT inserted through vocal cords under direct vision, positive ETCO2 and breath sounds checked- equal and bilateral Secured at: 21 cm Tube secured with: Tape Dental Injury: Teeth and Oropharynx as per pre-operative assessment

## 2022-06-02 NOTE — Transfer of Care (Signed)
Immediate Anesthesia Transfer of Care Note  Patient: Krista Gordon  Procedure(s) Performed: XI ROBOTIC ASSISTED LAPAROSCOPIC SACROCOLPOPEXY SUPRACEVICAL HYSTERECTOMY AND BILATERAL SALPINGO OOPHERECTOMY (Bilateral)  Patient Location: PACU  Anesthesia Type:General  Level of Consciousness: awake, alert , oriented and patient cooperative  Airway & Oxygen Therapy: Patient Spontanous Breathing and Patient connected to face mask oxygen  Post-op Assessment: Report given to RN, Post -op Vital signs reviewed and stable and Patient moving all extremities  Post vital signs: Reviewed and stable  Last Vitals:  Vitals Value Taken Time  BP 125/70 06/02/22 1151  Temp    Pulse 83 06/02/22 1159  Resp 18 06/02/22 1159  SpO2 93 % 06/02/22 1159  Vitals shown include unvalidated device data.  Last Pain:  Vitals:   06/02/22 0610  TempSrc:   PainSc: 0-No pain         Complications: No notable events documented.

## 2022-06-02 NOTE — H&P (Signed)
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   The patient was seen by Dr. Matilde Sprang, and on exam noted to have complete prolapse with near complete loss of her vagina.   The patient's prolapse has been progressively worsening. She has discomfort anytime she is up and walking. The patient does not have a lot of voiding symptoms as she only voids 3 times a day, and each time is a small amount. She does have some urinary urgency when she does have to pee. She feels that she is able to empty her bladder well. She had urodynamics demonstrating no evidence of stress urinary incontinence.   The patient has had a tubal ligation many years ago, but maintains her uterus. She is no history of endometrial, cervical, or breast cancer. The patient has no history of any additional abdominal surgeries.   The patient receives hemodialysis Tuesdays, Thursdays, and Saturdays. However, she is otherwise in relatively good shape. She is able to walk a flight of steps without any difficulty, and can walk a city block without shortness of breath.   The patient is extremely interested in surgery, given the hindrance that this prolapse has on her life and restricting her activity. Patient has no history of heart attack, and is no longer diabetic after losing weight.     ALLERGIES: None   MEDICATIONS: Lisinopril 40 mg tablet  Amlodipine Besylate 5 mg tablet  Atorvastatin Calcium 40 mg tablet  Colchicine 0.6 mg capsule  Famotidine 10 mg tablet  Iron  Multivitamin  Quinapril Hcl 40 mg tablet  Vitamin C     GU PSH: Complex cystometrogram, w/ void pressure and urethral pressure profile studies, any technique - 08/22/2021 Complex Uroflow - 08/22/2021 Emg surf Electrd - 08/22/2021 Inject For cystogram - 08/22/2021 Intrabd voidng Press -  08/22/2021     NON-GU PSH: No Non-GU PSH    GU PMH: Mixed incontinence - 08/22/2021    NON-GU PMH: No Non-GU PMH    FAMILY HISTORY: No Family History    SOCIAL HISTORY: No Social History    REVIEW OF SYSTEMS:    GU Review Female:   Patient denies frequent urination, hard to postpone urination, burning /pain with urination, get up at night to urinate, leakage of urine, stream starts and stops, trouble starting your stream, have to strain to urinate, and being pregnant.  Gastrointestinal (Upper):   Patient denies nausea, vomiting, and indigestion/ heartburn.  Gastrointestinal (Lower):   Patient denies diarrhea and constipation.  Constitutional:   Patient denies fever, night sweats, weight loss, and fatigue.  Skin:   Patient denies skin rash/ lesion and itching.  Eyes:   Patient denies blurred vision and double vision.  Ears/ Nose/ Throat:   Patient denies sore throat and sinus problems.  Hematologic/Lymphatic:   Patient denies swollen glands and easy bruising.  Cardiovascular:   Patient denies leg swelling and chest pains.  Respiratory:   Patient denies cough and shortness of breath.  Endocrine:   Patient denies excessive thirst.  Musculoskeletal:   Patient denies back pain and joint pain.  Neurological:   Patient denies headaches and dizziness.  Psychologic:   Patient denies anxiety and depression.   VITAL SIGNS:      02/16/2022 01:26 PM  Height 62 in / 157.48 cm  BP 134/70 mmHg  Pulse 84 /min  Temperature 97.3 F / 36.2 C  MULTI-SYSTEM PHYSICAL EXAMINATION:    Constitutional: Well-nourished. No physical deformities. Normally developed. Good grooming.  Respiratory: Normal breath sounds. No labored breathing, no use of accessory muscles.   Cardiovascular: Regular rate and rhythm. No murmur, no gallop. Normal temperature, normal extremity pulses, no swelling, no varicosities.      Complexity of Data:  Source Of History:  Patient  Records Review:   Previous Doctor Records,  Previous Patient Records, POC Tool  Urine Test Review:   Urinalysis  Urodynamics Review:   Review Urodynamics Tests   PROCEDURES:          Urinalysis w/Scope - 81001 Dipstick Dipstick Cont'd Micro  Color: Straw Bilirubin: Neg WBC/hpf: 10 - 20/hpf  Appearance: Cloudy Ketones: Neg RBC/hpf: 3 - 10/hpf  Specific Gravity: 1.015 Blood: 3+ Bacteria: Many (>50/hpf)  pH: 8.5 Protein: 3+ Cystals: NS (Not Seen)  Glucose: Neg Urobilinogen: 0.2 Casts: NS (Not Seen)    Nitrites: Neg Trichomonas: Not Present    Leukocyte Esterase: 1+ Mucous: Not Present      Epithelial Cells: 0 - 5/hpf      Yeast: NS (Not Seen)      Sperm: Not Present    Notes:      ASSESSMENT:      ICD-10 Details  1 GU:   Mixed incontinence - N39.46   2   Cystocele, 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 I explained to the patient the rationale for the surgery. I also went over the placement of the laparoscopic ports. I detailed to her the surgery as well as the postoperative recovery time. I explained to the patient that she could expect to be in the hospital at least one or 2 nights. She will require 4 weeks of no heavy lifting, 6 weeks of no bending or twisting. She will not be able to use her vagina for 6 weeks. I discussed complications of the operation including injury to bowel, ureters, bladder. We also discussed the risk of failure as well as the complications of mesh. I explained to them the difference between transvaginal mesh and the mesh used for sacral colpopexy. I reassured them that there has not been an FDA warnings in regards to sacral colpopexy mesh. We will plan to get this prior to her surgery. I spent 45 minutes with the patient going over that ins and outs of the surgery and answering all her questions.   The patient will also need a hysterectomy  concomitantly.   The patient is hemodialysis patient understands the risks of bleeding. She also understands the risk of delayed healing. Despite this she is eager to proceed. She will need clearance, I will request this from her primary care doctor.

## 2022-06-02 NOTE — Discharge Instructions (Addendum)
Dialysis rescheduled for 11AM on Saturday on [address].  Driving:  It is against the law to drive when taking narcotic pain medications.  You should wait at least 8 hours after taking your last pain pill before driving.  Further, you should not drive if you are to sore to react quickly or if you have something impeding your ability to drive.   Activity:  You are encouraged to ambulate frequently (about every hour during waking hours) to help prevent blood clots from forming in your legs or lungs.  However, you should not engage in any heavy lifting (> 10-15 lbs), strenuous activity, or straining.  Diet: You should advance your diet as instructed by your physician.  It will be normal to have some bloating, nausea, and abdominal discomfort intermittently.  Prescriptions:  You will be provided a prescription for pain medication to take as needed.  If your pain is not severe enough to require the prescription pain medication, you may take extra strength Tylenol instead which will have less side effects.  You should also take a prescribed stool softener to avoid straining with bowel movements as the prescription pain medication may constipate you.  Incisions: You may remove your dressing bandages 48 hours after surgery if not removed in the hospital.  You will either have some small staples or special tissue glue at each of the incision sites. Once the bandages are removed (if present), the incisions may stay open to air.  You may start showering (but not soaking or bathing in water) the 2nd day after surgery and the incisions simply need to be patted dry after the shower.  No additional care is needed.  What to call us about: You should call the office [phone]) if you develop fever > 101 or develop persistent vomiting. Activity:  You are encouraged to ambulate frequently (about every hour during waking hours) to help prevent blood clots from forming in your legs or lungs.  However, you should  not engage in any heavy lifting (> 10-15 lbs), strenuous activity, or straining.  Please take the antibiotic, keflex, for the next 5 days twice daily. On days you have dialysis, please take the antibiotic following dialysis. Please pack your groin wound daily with a new dressing

## 2022-06-02 NOTE — Interval H&P Note (Signed)
History and Physical Interval Note:  06/02/2022 7:17 AM  Krista Gordon  has presented today for surgery, with the diagnosis of PELVIC ORGAN PROLAPSE.  The various methods of treatment have been discussed with the patient and family. After consideration of risks, benefits and other options for treatment, the patient has consented to  Procedure(s) with comments: XI ROBOTIC ASSISTED LAPAROSCOPIC SACROCOLPOPEXY SUPRACEVICAL HYSTERECTOMY AND BILATERAL SALPINGO OOPHERECTOMY (Bilateral) - 4.5 HRS FOR THIS CASE as a surgical intervention.  The patient's history has been reviewed, patient examined, no change in status, stable for surgery.  I have reviewed the patient's chart and labs.  Questions were answered to the patient's satisfaction.     Ardis Hughs

## 2022-06-02 NOTE — Progress Notes (Signed)
RN called pt daughter while in room, pt requested to call her

## 2022-06-02 NOTE — Anesthesia Postprocedure Evaluation (Signed)
Anesthesia Post Note  Patient: ALLEYNE LAC  Procedure(s) Performed: XI ROBOTIC ASSISTED LAPAROSCOPIC SACROCOLPOPEXY SUPRACEVICAL HYSTERECTOMY AND BILATERAL SALPINGO OOPHERECTOMY (Bilateral)     Patient location during evaluation: PACU Anesthesia Type: General Level of consciousness: awake and alert Pain management: pain level controlled Vital Signs Assessment: post-procedure vital signs reviewed and stable Respiratory status: spontaneous breathing, nonlabored ventilation and respiratory function stable Cardiovascular status: blood pressure returned to baseline Postop Assessment: no apparent nausea or vomiting Anesthetic complications: no   No notable events documented.  Last Vitals:  Vitals:   06/02/22 1345 06/02/22 1511  BP: (!) 94/51 (!) 104/54  Pulse: 65 61  Resp: 18 18  Temp: (!) 36.3 C (!) 36.4 C  SpO2: 97% 100%    Last Pain:  Vitals:   06/02/22 1511  TempSrc: Oral  PainSc:                  Marthenia Rolling

## 2022-06-02 NOTE — Interval H&P Note (Signed)
History and Physical Interval Note:  06/02/2022 7:18 AM  Krista Gordon  has presented today for surgery, with the diagnosis of PELVIC ORGAN PROLAPSE.  The various methods of treatment have been discussed with the patient and family. After consideration of risks, benefits and other options for treatment, the patient has consented to  Procedure(s) with comments: XI ROBOTIC ASSISTED LAPAROSCOPIC SACROCOLPOPEXY SUPRACEVICAL HYSTERECTOMY AND BILATERAL SALPINGO OOPHERECTOMY (Bilateral) - 4.5 HRS FOR THIS CASE as a surgical intervention.  The patient's history has been reviewed, patient examined, no change in status, stable for surgery.  I have reviewed the patient's chart and labs.  Questions were answered to the patient's satisfaction.     Ardis Hughs

## 2022-06-03 LAB — BASIC METABOLIC PANEL
Anion gap: 8 (ref 5–15)
BUN: 22 mg/dL (ref 8–23)
CO2: 27 mmol/L (ref 22–32)
Calcium: 8 mg/dL — ABNORMAL LOW (ref 8.9–10.3)
Chloride: 103 mmol/L (ref 98–111)
Creatinine, Ser: 4.59 mg/dL — ABNORMAL HIGH (ref 0.44–1.00)
GFR, Estimated: 9 mL/min — ABNORMAL LOW (ref >60)
Glucose, Bld: 76 mg/dL (ref 70–99)
Potassium: 5.2 mmol/L — ABNORMAL HIGH (ref 3.5–5.1)
Sodium: 138 mmol/L (ref 135–145)

## 2022-06-03 LAB — HEMOGLOBIN AND HEMATOCRIT, BLOOD
HCT: 28.9 % — ABNORMAL LOW (ref 36.0–46.0)
Hemoglobin: 8.2 g/dL — ABNORMAL LOW (ref 12.0–15.0)

## 2022-06-03 MED ORDER — CEPHALEXIN 500 MG PO CAPS: 500.0000 mg | ORAL_CAPSULE | Freq: Every day | ORAL | 0 refills | Status: DC

## 2022-06-03 NOTE — Progress Notes (Signed)
Pt discharged to home. Prior to discharge, IV and tele was removed, Pt was given DC paperwork regarding medications, follow up directions and instructions regarding condition and care. Pt verbalized understanding and stated no other concerns at this time. Pt stable at time of DC and left in personal vehicle driven by family.

## 2022-06-03 NOTE — Progress Notes (Signed)
Pt's foley removed, vaginal packing removed and wet to dry dressing done to L thigh area.  Minimal old sanguinous drainaged noted to packing.  Pt tolerated well.  Pt denies pain, just soreness to the touch on stomach where lap sites are located.  Pt tolerated clear liquids well.  No nausea noted.

## 2022-06-05 ENCOUNTER — Emergency Department (HOSPITAL_COMMUNITY): Payer: Medicare Other

## 2022-06-05 ENCOUNTER — Encounter (HOSPITAL_COMMUNITY): Payer: Self-pay | Admitting: Urology

## 2022-06-05 ENCOUNTER — Other Ambulatory Visit: Payer: Self-pay

## 2022-06-05 ENCOUNTER — Inpatient Hospital Stay (HOSPITAL_COMMUNITY)
Admission: EM | Admit: 2022-06-05 | Discharge: 2022-06-11 | DRG: 853 | Disposition: A | Discharge status: Home / Self Care | Payer: Medicare Other | Attending: Internal Medicine | Admitting: Internal Medicine

## 2022-06-05 DIAGNOSIS — E872 Acidosis, unspecified: Secondary | ICD-10-CM | POA: Diagnosis present

## 2022-06-05 DIAGNOSIS — Z87891 Personal history of nicotine dependence: Secondary | ICD-10-CM

## 2022-06-05 DIAGNOSIS — K7200 Acute and subacute hepatic failure without coma: Secondary | ICD-10-CM | POA: Diagnosis present

## 2022-06-05 DIAGNOSIS — Z87442 Personal history of urinary calculi: Secondary | ICD-10-CM

## 2022-06-05 DIAGNOSIS — R410 Disorientation, unspecified: Secondary | ICD-10-CM | POA: Diagnosis not present

## 2022-06-05 DIAGNOSIS — E8889 Other specified metabolic disorders: Secondary | ICD-10-CM | POA: Diagnosis present

## 2022-06-05 DIAGNOSIS — I480 Paroxysmal atrial fibrillation: Secondary | ICD-10-CM | POA: Diagnosis present

## 2022-06-05 DIAGNOSIS — G9341 Metabolic encephalopathy: Secondary | ICD-10-CM | POA: Diagnosis present

## 2022-06-05 DIAGNOSIS — Z992 Dependence on renal dialysis: Secondary | ICD-10-CM | POA: Diagnosis not present

## 2022-06-05 DIAGNOSIS — E11649 Type 2 diabetes mellitus with hypoglycemia without coma: Secondary | ICD-10-CM | POA: Diagnosis not present

## 2022-06-05 DIAGNOSIS — I120 Hypertensive chronic kidney disease with stage 5 chronic kidney disease or end stage renal disease: Secondary | ICD-10-CM | POA: Diagnosis present

## 2022-06-05 DIAGNOSIS — D631 Anemia in chronic kidney disease: Secondary | ICD-10-CM | POA: Diagnosis present

## 2022-06-05 DIAGNOSIS — J690 Pneumonitis due to inhalation of food and vomit: Secondary | ICD-10-CM | POA: Diagnosis present

## 2022-06-05 DIAGNOSIS — D689 Coagulation defect, unspecified: Secondary | ICD-10-CM | POA: Diagnosis present

## 2022-06-05 DIAGNOSIS — N8111 Cystocele, midline: Secondary | ICD-10-CM | POA: Diagnosis present

## 2022-06-05 DIAGNOSIS — D62 Acute posthemorrhagic anemia: Secondary | ICD-10-CM | POA: Diagnosis present

## 2022-06-05 DIAGNOSIS — R6520 Severe sepsis without septic shock: Secondary | ICD-10-CM | POA: Diagnosis present

## 2022-06-05 DIAGNOSIS — I10 Essential (primary) hypertension: Secondary | ICD-10-CM | POA: Diagnosis present

## 2022-06-05 DIAGNOSIS — E1122 Type 2 diabetes mellitus with diabetic chronic kidney disease: Secondary | ICD-10-CM | POA: Diagnosis present

## 2022-06-05 DIAGNOSIS — R579 Shock, unspecified: Secondary | ICD-10-CM | POA: Diagnosis present

## 2022-06-05 DIAGNOSIS — Z781 Physical restraint status: Secondary | ICD-10-CM

## 2022-06-05 DIAGNOSIS — I428 Other cardiomyopathies: Secondary | ICD-10-CM | POA: Diagnosis not present

## 2022-06-05 DIAGNOSIS — Z79899 Other long term (current) drug therapy: Secondary | ICD-10-CM

## 2022-06-05 DIAGNOSIS — K838 Other specified diseases of biliary tract: Secondary | ICD-10-CM | POA: Diagnosis present

## 2022-06-05 DIAGNOSIS — J90 Pleural effusion, not elsewhere classified: Secondary | ICD-10-CM | POA: Diagnosis present

## 2022-06-05 DIAGNOSIS — J9601 Acute respiratory failure with hypoxia: Secondary | ICD-10-CM | POA: Diagnosis present

## 2022-06-05 DIAGNOSIS — N186 End stage renal disease: Secondary | ICD-10-CM

## 2022-06-05 DIAGNOSIS — F05 Delirium due to known physiological condition: Secondary | ICD-10-CM | POA: Diagnosis not present

## 2022-06-05 DIAGNOSIS — R1310 Dysphagia, unspecified: Secondary | ICD-10-CM | POA: Diagnosis present

## 2022-06-05 DIAGNOSIS — D696 Thrombocytopenia, unspecified: Secondary | ICD-10-CM | POA: Diagnosis present

## 2022-06-05 DIAGNOSIS — R4182 Altered mental status, unspecified: Secondary | ICD-10-CM | POA: Diagnosis present

## 2022-06-05 DIAGNOSIS — J9602 Acute respiratory failure with hypercapnia: Secondary | ICD-10-CM | POA: Diagnosis present

## 2022-06-05 DIAGNOSIS — Z794 Long term (current) use of insulin: Secondary | ICD-10-CM | POA: Diagnosis not present

## 2022-06-05 DIAGNOSIS — Z888 Allergy status to other drugs, medicaments and biological substances status: Secondary | ICD-10-CM

## 2022-06-05 DIAGNOSIS — A419 Sepsis, unspecified organism: Secondary | ICD-10-CM | POA: Diagnosis present

## 2022-06-05 DIAGNOSIS — J189 Pneumonia, unspecified organism: Secondary | ICD-10-CM | POA: Diagnosis not present

## 2022-06-05 DIAGNOSIS — E785 Hyperlipidemia, unspecified: Secondary | ICD-10-CM | POA: Diagnosis present

## 2022-06-05 DIAGNOSIS — J8410 Pulmonary fibrosis, unspecified: Secondary | ICD-10-CM | POA: Diagnosis present

## 2022-06-05 DIAGNOSIS — M109 Gout, unspecified: Secondary | ICD-10-CM | POA: Diagnosis present

## 2022-06-05 DIAGNOSIS — N171 Acute kidney failure with acute cortical necrosis: Secondary | ICD-10-CM | POA: Diagnosis not present

## 2022-06-05 DIAGNOSIS — Z79890 Hormone replacement therapy (postmenopausal): Secondary | ICD-10-CM

## 2022-06-05 DIAGNOSIS — N3946 Mixed incontinence: Secondary | ICD-10-CM | POA: Diagnosis present

## 2022-06-05 DIAGNOSIS — E875 Hyperkalemia: Secondary | ICD-10-CM | POA: Diagnosis present

## 2022-06-05 DIAGNOSIS — A4102 Sepsis due to Methicillin resistant Staphylococcus aureus: Secondary | ICD-10-CM | POA: Diagnosis not present

## 2022-06-05 HISTORY — DX: End stage renal disease: Z99.2

## 2022-06-05 HISTORY — DX: End stage renal disease: N18.6

## 2022-06-05 LAB — URINALYSIS, ROUTINE W REFLEX MICROSCOPIC
Bilirubin Urine: NEGATIVE
Glucose, UA: NEGATIVE mg/dL
Ketones, ur: NEGATIVE mg/dL
Nitrite: NEGATIVE
Protein, ur: 100 mg/dL — AB
RBC / HPF: >50 RBC/hpf — ABNORMAL HIGH (ref 0–5)
Specific Gravity, Urine: 1.016 (ref 1.005–1.030)
WBC, UA: >50 WBC/hpf — ABNORMAL HIGH (ref 0–5)
pH: 5 (ref 5.0–8.0)

## 2022-06-05 LAB — PROTIME-INR
INR: 1.7 — ABNORMAL HIGH (ref 0.8–1.2)
Prothrombin Time: 19.8 seconds — ABNORMAL HIGH (ref 11.4–15.2)

## 2022-06-05 LAB — COMPREHENSIVE METABOLIC PANEL
ALT: 19 U/L (ref 0–44)
AST: 126 U/L — ABNORMAL HIGH (ref 15–41)
Albumin: 3.1 g/dL — ABNORMAL LOW (ref 3.5–5.0)
Alkaline Phosphatase: 138 U/L — ABNORMAL HIGH (ref 38–126)
Anion gap: 18 — ABNORMAL HIGH (ref 5–15)
BUN: 42 mg/dL — ABNORMAL HIGH (ref 8–23)
CO2: 18 mmol/L — ABNORMAL LOW (ref 22–32)
Calcium: 8.4 mg/dL — ABNORMAL LOW (ref 8.9–10.3)
Chloride: 103 mmol/L (ref 98–111)
Creatinine, Ser: 6.8 mg/dL — ABNORMAL HIGH (ref 0.44–1.00)
GFR, Estimated: 6 mL/min — ABNORMAL LOW (ref >60)
Glucose, Bld: 90 mg/dL (ref 70–99)
Potassium: 5.8 mmol/L — ABNORMAL HIGH (ref 3.5–5.1)
Sodium: 139 mmol/L (ref 135–145)
Total Bilirubin: <0.1 mg/dL — ABNORMAL LOW (ref 0.3–1.2)
Total Protein: 6.9 g/dL (ref 6.5–8.1)

## 2022-06-05 LAB — MRSA NEXT GEN BY PCR, NASAL: MRSA by PCR Next Gen: NOT DETECTED

## 2022-06-05 LAB — BLOOD GAS, ARTERIAL
Acid-base deficit: 3.3 mmol/L — ABNORMAL HIGH (ref 0.0–2.0)
Bicarbonate: 23.8 mmol/L (ref 20.0–28.0)
O2 Saturation: 73.2 %
Patient temperature: 37
pCO2 arterial: 53 mmHg — ABNORMAL HIGH (ref 32–48)
pH, Arterial: 7.26 — ABNORMAL LOW (ref 7.35–7.45)
pO2, Arterial: 49 mmHg — ABNORMAL LOW (ref 83–108)

## 2022-06-05 LAB — SURGICAL PATHOLOGY

## 2022-06-05 LAB — GLUCOSE, CAPILLARY
Glucose-Capillary: 119 mg/dL — ABNORMAL HIGH (ref 70–99)
Glucose-Capillary: 96 mg/dL (ref 70–99)

## 2022-06-05 LAB — CBC WITH DIFFERENTIAL/PLATELET
Abs Immature Granulocytes: 0.08 10*3/uL — ABNORMAL HIGH (ref 0.00–0.07)
Basophils Absolute: 0 10*3/uL (ref 0.0–0.1)
Basophils Relative: 0 %
Eosinophils Absolute: 0 10*3/uL (ref 0.0–0.5)
Eosinophils Relative: 0 %
HCT: 26.6 % — ABNORMAL LOW (ref 36.0–46.0)
Hemoglobin: 7.8 g/dL — ABNORMAL LOW (ref 12.0–15.0)
Immature Granulocytes: 1 %
Lymphocytes Relative: 7 %
Lymphs Abs: 1.1 10*3/uL (ref 0.7–4.0)
MCH: 31.2 pg (ref 26.0–34.0)
MCHC: 29.3 g/dL — ABNORMAL LOW (ref 30.0–36.0)
MCV: 106.4 fL — ABNORMAL HIGH (ref 80.0–100.0)
Monocytes Absolute: 1.1 10*3/uL — ABNORMAL HIGH (ref 0.1–1.0)
Monocytes Relative: 7 %
Neutro Abs: 13.8 10*3/uL — ABNORMAL HIGH (ref 1.7–7.7)
Neutrophils Relative %: 85 %
Platelets: 137 10*3/uL — ABNORMAL LOW (ref 150–400)
RBC: 2.5 MIL/uL — ABNORMAL LOW (ref 3.87–5.11)
RDW: 17.5 % — ABNORMAL HIGH (ref 11.5–15.5)
WBC: 16.1 10*3/uL — ABNORMAL HIGH (ref 4.0–10.5)
nRBC: 0.3 % — ABNORMAL HIGH (ref 0.0–0.2)

## 2022-06-05 LAB — POCT I-STAT 7, (LYTES, BLD GAS, ICA,H+H)
Acid-Base Excess: 2 mmol/L (ref 0.0–2.0)
Bicarbonate: 28 mmol/L (ref 20.0–28.0)
Calcium, Ion: 1.05 mmol/L — ABNORMAL LOW (ref 1.15–1.40)
HCT: 26 % — ABNORMAL LOW (ref 36.0–46.0)
Hemoglobin: 8.8 g/dL — ABNORMAL LOW (ref 12.0–15.0)
O2 Saturation: 95 %
Patient temperature: 36.6
Potassium: 5.1 mmol/L (ref 3.5–5.1)
Sodium: 139 mmol/L (ref 135–145)
TCO2: 30 mmol/L (ref 22–32)
pCO2 arterial: 50 mmHg — ABNORMAL HIGH (ref 32–48)
pH, Arterial: 7.355 (ref 7.35–7.45)
pO2, Arterial: 78 mmHg — ABNORMAL LOW (ref 83–108)

## 2022-06-05 LAB — LACTIC ACID, PLASMA: Lactic Acid, Venous: 6.6 mmol/L (ref 0.5–1.9)

## 2022-06-05 LAB — CBG MONITORING, ED: Glucose-Capillary: 70 mg/dL (ref 70–99)

## 2022-06-05 LAB — APTT: aPTT: 36 seconds (ref 24–36)

## 2022-06-05 MED ORDER — HEPARIN SODIUM (PORCINE) 5000 UNIT/ML IJ SOLN
5000.0000 [IU] | Freq: Three times a day (TID) | INTRAMUSCULAR | Status: DC
  Administered 2022-06-05 – 2022-06-11 (×16): 5000 [IU] via SUBCUTANEOUS
  Filled 2022-06-05 (×17): qty 1

## 2022-06-05 MED ORDER — SODIUM BICARBONATE 8.4 % IV SOLN
50.0000 meq | Freq: Once | INTRAVENOUS | Status: AC
  Administered 2022-06-05: 50 meq via INTRAVENOUS
  Filled 2022-06-05: qty 50

## 2022-06-05 MED ORDER — CHLORHEXIDINE GLUCONATE CLOTH 2 % EX PADS
6.0000 | MEDICATED_PAD | Freq: Every day | CUTANEOUS | Status: DC
  Administered 2022-06-05 – 2022-06-06 (×2): 6 via TOPICAL

## 2022-06-05 MED ORDER — INSULIN ASPART 100 UNIT/ML IV SOLN
5.0000 [IU] | Freq: Once | INTRAVENOUS | Status: AC
  Administered 2022-06-05: 5 [IU] via INTRAVENOUS
  Filled 2022-06-05: qty 0.05

## 2022-06-05 MED ORDER — METRONIDAZOLE 500 MG/100ML IV SOLN
500.0000 mg | Freq: Two times a day (BID) | INTRAVENOUS | Status: DC
  Administered 2022-06-06: 500 mg via INTRAVENOUS
  Filled 2022-06-05: qty 100

## 2022-06-05 MED ORDER — METRONIDAZOLE 500 MG/100ML IV SOLN
500.0000 mg | Freq: Once | INTRAVENOUS | Status: AC
Start: 2022-06-05 — End: 2022-06-05
  Administered 2022-06-05: 500 mg via INTRAVENOUS
  Filled 2022-06-05: qty 100

## 2022-06-05 MED ORDER — VANCOMYCIN HCL IN DEXTROSE 1-5 GM/200ML-% IV SOLN: 1000.0000 mg | Freq: Once | INTRAVENOUS | Status: DC

## 2022-06-05 MED ORDER — SODIUM CHLORIDE 0.9 % IV SOLN
2.0000 g | Freq: Once | INTRAVENOUS | Status: AC
  Administered 2022-06-05: 2 g via INTRAVENOUS
  Filled 2022-06-05: qty 12.5

## 2022-06-05 MED ORDER — SODIUM BICARBONATE 8.4 % IV SOLN
INTRAVENOUS | Status: AC
  Filled 2022-06-05: qty 150

## 2022-06-05 MED ORDER — ALBUMIN HUMAN 5 % IV SOLN
12.5000 g | Freq: Once | INTRAVENOUS | Status: AC
  Administered 2022-06-05: 12.5 g via INTRAVENOUS
  Filled 2022-06-05 (×2): qty 250

## 2022-06-05 MED ORDER — SODIUM CHLORIDE (PF) 0.9 % IJ SOLN
INTRAMUSCULAR | Status: AC
  Filled 2022-06-05: qty 50

## 2022-06-05 MED ORDER — LACTATED RINGERS IV BOLUS (SEPSIS): 1000.0000 mL | Freq: Once | INTRAVENOUS | Status: DC

## 2022-06-05 MED ORDER — LACTATED RINGERS IV BOLUS
510.0000 mL | Freq: Once | INTRAVENOUS | Status: AC
  Administered 2022-06-05: 510 mL via INTRAVENOUS

## 2022-06-05 MED ORDER — LACTATED RINGERS IV BOLUS
1000.0000 mL | Freq: Once | INTRAVENOUS | Status: AC
  Administered 2022-06-05: 1000 mL via INTRAVENOUS

## 2022-06-05 MED ORDER — LACTATED RINGERS IV BOLUS
500.0000 mL | Freq: Once | INTRAVENOUS | Status: AC
  Administered 2022-06-05: 500 mL via INTRAVENOUS

## 2022-06-05 MED ORDER — VANCOMYCIN HCL 500 MG/100ML IV SOLN
500.0000 mg | INTRAVENOUS | Status: DC
  Filled 2022-06-05: qty 100

## 2022-06-05 MED ORDER — CALCIUM GLUCONATE-NACL 1-0.675 GM/50ML-% IV SOLN
1.0000 g | Freq: Once | INTRAVENOUS | Status: AC
  Administered 2022-06-05: 1000 mg via INTRAVENOUS
  Filled 2022-06-05: qty 50

## 2022-06-05 MED ORDER — VANCOMYCIN HCL 1250 MG/250ML IV SOLN
1250.0000 mg | Freq: Once | INTRAVENOUS | Status: AC
  Administered 2022-06-05: 1250 mg via INTRAVENOUS
  Filled 2022-06-05: qty 250

## 2022-06-05 MED ORDER — DEXTROSE 50 % IV SOLN
50.0000 mL | Freq: Once | INTRAVENOUS | Status: AC
Start: 2022-06-05 — End: 2022-06-05
  Administered 2022-06-05: 50 mL via INTRAVENOUS
  Filled 2022-06-05: qty 50

## 2022-06-05 MED ORDER — IOHEXOL 300 MG/ML  SOLN
75.0000 mL | Freq: Once | INTRAMUSCULAR | Status: AC | PRN
  Administered 2022-06-05: 75 mL via INTRAVENOUS

## 2022-06-05 MED ORDER — LACTATED RINGERS IV SOLN: INTRAVENOUS | Status: DC

## 2022-06-05 MED ORDER — SODIUM CHLORIDE 0.9 % IV SOLN: 2.0000 g | INTRAVENOUS | Status: DC

## 2022-06-05 MED ORDER — VANCOMYCIN VARIABLE DOSE PER UNSTABLE RENAL FUNCTION (PHARMACIST DOSING)
Status: DC
Start: 2022-06-05 — End: 2022-06-05

## 2022-06-05 NOTE — Consult Note (Signed)
Renal Service Consult Note [facility]  Krista Gordon 06/05/2022 [name], MD Requesting Physician: Dr. Marchelle Gearing  Reason for Consult: ESRD pt w/ shock and AMS HPI: The patient is a 81 y.o. year-old w/ hx of anemia, DM2, gout, HL, HTN and esrd on HD TTS in [place] [facility] who presented to ED today after family found her unresponsive at home. Pt recently underwent robotic assisted laparoscopic hysterectomy, bilateral salpingo-oopherectomy, and sacrocolpopexy for pelvic organ prolapse on 8/18. She was discharged home on 8/19.  Since d/c family says pt has been fatigued, lethargic, w/ poor po intake and primarily has been sleeping. At baseline they report she is independent in her ADL's, drives and still works as a Teacher, English as a foreign language.  In ED sats were 79% on RA, sats improved w/ 3L O2. She rec'd glucagon. BP 90s, afebrile, got 1.5L of LR. WBC 16, K 5.8 creat 6, alb 3.1. EKG afib w/ VR 90s. CXR showed chronic IS prominence. CT head neg. Blood and urine cx's sent and pt started on empiric IV abx. PCCM called to admit. We are asked to see for ESRD.    Pt seen in ED. Pt is lethargic and gives no history. Family confirms symptoms as above. She had 1 hr HD on Sat at OP unit. Started HD about 1 yr ago.   ROS - n/a   Past Medical History  Past Medical History:  Diagnosis Date   Anemia    COVID    patient states had COVID "over a month ago and in hospital 3 days"   Diabetes mellitus without complication (HCC)    Gall stone    Gout    Hyperlipidemia    Hypertension    Renal disorder    kidney disease   Past Surgical History  Past Surgical History:  Procedure Laterality Date   A/V FISTULAGRAM Left 12/05/2021   Procedure: A/V Fistulagram;  Surgeon: Annice Needy, MD;  Location: [facility] INVASIVE CV LAB;  Service: Cardiovascular;  Laterality: Left;   A/V SHUNTOGRAM Left 02/16/2020   Procedure: A/V SHUNTOGRAM;  Surgeon: Annice Needy, MD;  Location: [facility] INVASIVE CV LAB;  Service:  Cardiovascular;  Laterality: Left;   AV FISTULA PLACEMENT Left 01/15/2020   Procedure: INSERTION OF ARTERIOVENOUS (AV) GORE-TEX GRAFT ARM;  Surgeon: Annice Needy, MD;  Location: [facility] ORS;  Service: Vascular;  Laterality: Left;   COLONOSCOPY WITH PROPOFOL N/A 01/27/2016   Procedure: COLONOSCOPY WITH PROPOFOL;  Surgeon: Wallace Cullens, MD;  Location: [facility] ENDOSCOPY;  Service: Gastroenterology;  Laterality: N/A;   ROBOTIC ASSISTED LAPAROSCOPIC SACROCOLPOPEXY Bilateral 06/02/2022   Procedure: XI ROBOTIC ASSISTED LAPAROSCOPIC SACROCOLPOPEXY SUPRACEVICAL HYSTERECTOMY AND BILATERAL SALPINGO OOPHERECTOMY;  Surgeon: Crist Fat, MD;  Location: [facility] ORS;  Service: Urology;  Laterality: Bilateral;  4.5 HRS FOR THIS CASE   TUBAL LIGATION     Family History  Family History  Problem Relation Age of Onset   Breast cancer Sister 40   Social History  reports that she has quit smoking. She has never used smokeless tobacco. She reports that she does not drink alcohol and does not use drugs. Allergies  Allergies  Allergen Reactions   Mircera [Methoxy Polyethylene Glycol-Epoetin Beta]     Patient doesn't recall this allergy   Tramadol Itching   Home medications Prior to Admission medications   Medication Sig Start Date End Date Taking? Authorizing Provider  amLODipine (NORVASC) 5 MG tablet Take 5 mg by mouth daily. 01/02/22  Yes [provider]  atorvastatin (LIPITOR) 40 MG tablet Take 40 mg by mouth at bedtime.    Yes [provider]  cephALEXin (KEFLEX) 500 MG capsule Take 1 capsule (500 mg total) by mouth at bedtime for 5 days. 06/03/22 06/08/22 Yes McKenzie, Candee Furbish, MD  colchicine 0.6 MG tablet Take 0.6 mg by mouth every other day.   Yes [provider]  estradiol (ESTRACE VAGINAL) 0.1 MG/GM vaginal cream Place 1 Applicatorful vaginally 3 (three) times a week. Use 1 small dolyp of cream on tip of index finger and swap the inside of the vagina 06/02/22  Yes Ardis Hughs, MD   lisinopril (ZESTRIL) 40 MG tablet Take 40 mg by mouth daily.   Yes [provider]  PROAIR HFA 108 (90 Base) MCG/ACT inhaler Inhale 2 puffs into the lungs every 6 (six) hours as needed for shortness of breath. 06/15/18  Yes [provider]  sodium bicarbonate 650 MG tablet Take 1,300 mg by mouth 2 (two) times daily.   Yes [provider]  famotidine (PEPCID) 10 MG tablet Take 1 tablet (10 mg total) by mouth daily. Patient not taking: Reported on 05/15/2022 10/20/21 12/05/21  Kayleen Memos, DO  multivitamin (RENA-VIT) TABS tablet Take 1 tablet by mouth at bedtime. Patient not taking: Reported on 05/15/2022 10/20/21   Kayleen Memos, DO  mupirocin ointment (BACTROBAN) 2 % Apply topically 2 (two) times daily. Patient not taking: Reported on 05/15/2022 10/20/21   Kayleen Memos, DO  traMADol (ULTRAM) 50 MG tablet Take 1-2 tablets (50-100 mg total) by mouth every 6 (six) hours as needed for moderate pain or severe pain. Patient not taking: Reported on 06/05/2022 06/02/22   Debbrah Alar, PA-C     Vitals:   06/05/22 1830 06/05/22 1900 06/05/22 1930 06/05/22 1945  BP: (!) 120/57 (!) 105/39 (!) 101/52 (!) 114/54  Pulse: 81 79 79 82  Resp: $Remo'14 13 12 14  'FPAkc$ Temp: 98.3 F (36.8 C) 98.3 F (36.8 C) 98.5 F (36.9 C) 98.5 F (36.9 C)  TempSrc:      SpO2: 97% 96% 95% 93%  Weight:      Height:       Exam Gen pt is lethargic and confused, not verbal at this time No rash, cyanosis or gangrene Sclera anicteric, throat clear  No jvd or bruits Chest clear bilat to bases, no rales/ wheezing RRR no MRG Abd soft ntnd obese, clean wounds from recent lap surgery GU defer MS no joint effusions or deformity Ext no pitting LE or UE edema, no wounds or ulcers Neuro is stuporous or obtunded, nonfocal    Home meds include -amlodipine 5, atorvastatin, colchicine, estradiol, famotidine, lisniopril 40 qd, renavite, proair hfa, sodium bicarbonate 2 bid, tramadol, prns/ vits/ supps    Na 139  K  5.8 CO2 18  BUN 42  Creat 6.8  Ca 8.4  Alb 3.1  LA 6.6 > 3.5    WBC 16k   Hb 7.8      UA rbc >50, wbc > 50, 0-5 epis, prot 100    CT abd pelvis > new bilat basilar consolidation R > L base and new R > L pleural effusions, some underlying fibrotic changes as well   OP HD: TTS [facility] in [place], [place] - get records   Assessment/ Plan: Severe sepsis - w/ hypotension, Pecola Leisure and ^wBC and AMS out of proportion to her BP's. CT showing prob R > L dense PNA, also UA showing pyuria / hematuria. Started  on broad spec IV abx and given IVF"s and BP's were better at the end of the day today. Per pmd.  ESRD - usual HD on TTS.  Missed 3h of HD on Saturday, 8/19.  Labs okay and vol looks a bit dry on exam. Agree w/ IVF"s overnight and then reassess in am. Will plan iHD tomorrow, hopefully BP's will allow.  Hyperkalemia - K+ 5.8 here, getting ED temporizing measures and will be repeated around 9 pm tonight. If worsening may need iHD or CRRT tonite.  Met acidosis - getting IV bicarb gtt, will continue Acute hypoxic resp failure / asp PNA/ bilat pleural effusions - per CCM AMS - stuporous on exam, NH3 pending PAF - new onset, per pmd Anemia esrd - get records DM - on insulin, per pmd Pelvic organ prolapse - sp recent laparoscopic hysterectomy, bilateral salpingo-oopherectomy, and sacrocolpopexy by urology on 8/18    Rob Klever [place]  MD 06/05/2022, 8:15 PM Recent Labs  Lab 06/02/22 0615 06/03/22 0530 06/05/22 1255 06/05/22 1347  HGB 10.1* 8.2*  --  7.8*  ALBUMIN 3.2*  --  3.1*  --   CALCIUM 9.0 8.0* 8.4*  --   CREATININE 3.79* 4.59* 6.80*  --   K 4.0 5.2* 5.8*  --

## 2022-06-05 NOTE — Progress Notes (Signed)
A consult was received from an ED physician for vancomycin/cefepime per pharmacy dosing.  The patient's profile has been reviewed for ht/wt/allergies/indication/available labs.   A one time order has been placed for vancomycin '1250mg'$  and cefepime 2g.  Further antibiotics/pharmacy consults should be ordered by admitting physician if indicated.                       Thank you, Peggyann Juba, PharmD, BCPS 06/05/2022  1:45 PM

## 2022-06-05 NOTE — Sepsis Progress Note (Signed)
Code Sepsis protocol being monitored by eLink. 

## 2022-06-05 NOTE — Progress Notes (Addendum)
Pharmacy Antibiotic Note  Krista Gordon is a 81 y.o. female admitted on 06/05/2022 with altered mental status. Recently underwent hysterectomy, bilateral salpingo-oophorectomy, and sacrocolpopexy for pelvic organ prolapse on 8/18.  Patient has a history of ESRD and is on HD TTS.  Attempted HD session on Saturday, 8/19, but patient only tolerated 1h of HD due to hypotension.  Pharmacy has been consulted for vancomycin and cefepime dosing.  Nephrology following and no strong indication for acute HD this evening - planning to resume HD schedule tomorrow, 8/22. If repeat K+ this evening not improved, may need CRRT.   Received vancomycin '1250mg'$  IV x1 and cefepime 2g IV x1 while in ED. Tm 98.2, WBC 16  Plan: Vancomycin '500mg'$  IV w/ HD Cefepime 2g IV w/ HD Flagyl per MD F/u if CRRT initiated - will need to adjust antibiotic orders if so  Height: '5\' 2"'$  (157.5 cm) Weight: 56.2 kg (123 lb 14.4 oz) IBW/kg (Calculated) : 50.1  Temp (24hrs), Avg:98 F (36.7 C), Min:96.4 F (35.8 C), Max:98.2 F (36.8 C)  Recent Labs  Lab 06/02/22 0615 06/03/22 0530 06/05/22 1254 06/05/22 1255 06/05/22 1347 06/05/22 1511  WBC 6.2  --   --   --  16.1*  --   CREATININE 3.79* 4.59*  --  6.80*  --   --   LATICACIDVEN  --   --  6.6*  --   --  3.5*    Estimated Creatinine Clearance: 5.3 mL/min (A) (by C-G formula based on SCr of 6.8 mg/dL (H)).    Allergies  Allergen Reactions   Mircera [Methoxy Polyethylene Glycol-Epoetin Beta]     Patient doesn't recall this allergy    Antimicrobials this admission: Vancomycin 8/21 >>  Cefepime 8/21 >>  Flagyl 8/21 >>  Dose adjustments this admission:  Microbiology results: 8/21 BCx:  8/21 UCx:    Thank you for allowing pharmacy to be a part of this patient's care.  Dimple Nanas, PharmD, BCPS 06/05/2022 5:23 PM

## 2022-06-05 NOTE — Op Note (Signed)
Preoperative diagnosis:   Pelvic organ prolapse  Postoperative diagnosis:   Same  Procedure: Robotic-assisted laparoscopic supracervical hysterectomy and  bilateral salpingo-oopherectomy Robotic-assisted laparoscopic sacrocolpopexy  Surgeon: Ardis Hughs, MD First assistant: Debbrah Alar, PA-C Resident assistant: Hinton Rao, MD  Anesthesia: General  Complications: None  Intraoperative findings:  Sharkey Y mesh used for the sacrocolpopexy.  EBL: 100 mL  Specimens: Uterus and proximal cervix, bilateral fallopian tubes and ovaries  Indication: Krista Gordon is a 81 y.o. female patient with symptomatic pelvic organ prolapse.    After reviewing the management options for treatment, she elected to proceed with the above surgical procedure(s). We have discussed the potential benefits and risks of the procedure, side effects of the proposed treatment, the likelihood of the patient achieving the goals of the procedure, and any potential problems that might occur during the procedure or recuperation. Informed consent has been obtained.  Description of procedure:  The patient was taken to the operating room and general anesthesia was induced.  The patient was placed in the dorsal lithotomy position, prepped and draped in the usual sterile fashion, and preoperative antibiotics were administered. A preoperative time-out was performed.    A Foley catheter was then placed and placed to gravity drainage. I then made a periumbilical incision carrying the dissection down to the patient's fascia with electrocautery.  Once to the fascia, the fascia was incised and a small puncture hole made in the peritoneum to allow passage of a 84m port.   The abdomen was insufflated and the remaining ports placed under digital guidance.  2 ports were placed lateral to the umbilicus on the right proximally 10 cm apart.  The most lateral port was approximately 3 cm above the anterior iliac  spine.  2 additional ports were placed in the patient's right side in comparable positions to the most lateral port on the right was a 12 mm port.the robot was then docked at an angle from the leg obliquely along the side of the left leg.  We then began our surgery by cleaning up some of the pelvic adhesions to the small bowel and colon.  Once this was completed I started dissecting at the sacral promontory located 3 cm medial to the location where the ureter crosses over the right iliac vessels at the pelvic brim. The posterior peritoneum was incised and the sacral prominence cleared off an area taking care to avoid the middle sacral vessels and the iliac branches.  I then created a posterior peritoneal tunnel starting at the sacral promontory and tunneling down the right pelvic sidewall down into the pelvis breaking back through the posterior peritoneum around the vesico-vaginal junction posteriorly.  I then continued the posterior dissection retracting down on the rectum and finding the avascular plane between the posterior vaginal wall and the rectum.  I carried this dissection down as far as I could to along the area of the perineal body.  Then focused my attention to the uterus and hysterectomy.  I first started by taking the right round ligament with a series of by polar cautery.  I then dissected the anterior leaf of the broad ligament slightly more proximal and then distally down across the anterior mucosa to the salpinx and the internal cervical os.  I then took of the uterine ovarian ligament on the right and dissected free the right salpinx. Once the anterior leaf of the broad ligament had been completely dissected on the patient's right and a small bladder flap had  been created anteriorly attention was turned to the left side where a similar dissection was carried out. I then turned my attention to the anterior plane between the anterior vaginal wall and the bladder.  I was able to obtain access to  the avascular plane and with a combination of both monopolar cautery and blunt dissection was able to get down to the bladder neck.  I then turned my attention back to the patient's uterus and skeletonized the right uterine artery and vein and then took this with a series of bipolar moves.  I then performed a similar uterus pedicle ligation on the left.  At his point I was able to identify the patient's cervix and came through supracervical with monopolar cautery once the uterus was freed from all its attachments it was pushed into the left paracolic gutter and our attention was turned to placing the mesh.  Mesh was measured at approximately 7.0 cm anteriorly and 7.0 cm posteriorly and I cut this on the back table.  The mesh was then placed into the patient's abdomen through the assistant port and the anterior leaf was secured down onto the anterior vaginal wall with the apex at the bladder neck.  The posterior leaf was then secured down on the posterior vaginal wall.  These were sewn down with 2-0 Vicryl.  Between 6 and 8 were done on each side.  At this point I then went back to the previously dissected sacral promontory and posterior peritoneal tunnel and inserted a instrument through the tunnel and grasped the end of the mesh at the vaginal cuff and pull it up to the sacrum.  I then checked to ensure that the sacral mesh was not too tight by performing a vaginal exam.  I then secured the sacral leg of the mesh using a 0 Prolene.  I then reapproximated the posterior peritoneum with a 2-0 Vicryl in a running fashion around the sacral promontory.  The pelvic peritoneum was closed using a pursestring.  A small Endo Catch bag was then gently passed through the assistant port and the uterus was placed in the bag.  The bag was then brought out through the camera port once the trochars were removed.  We then made a slightly larger extraction incision to remove the uterus.  The fascia was then closed with 0 Vicryl in a  figure-of-eight fashion.  The skin was closed with 4-0 Monocryl's.  Dermabond was applied to the incision and exparel injected into the incisions.  Estrace impregnated packing was then placed into her vagina which will be left in overnight.  The patient was subsequently extubated and returned to the PACU in excellent condition.

## 2022-06-05 NOTE — ED Triage Notes (Signed)
BIB [facility] from home after family found pt non responsive. Unknown when pt was last seen normal.

## 2022-06-05 NOTE — Consult Note (Signed)
Urology Consult   Physician requesting consult: Brand Males, MD  Reason for consult: AMS postop  History of Present Illness: Krista Gordon is a 81 y.o. female a history of ESRD who is s/p robotic sacrocolpopexy, hysterectomy and BSO with Dr. Louis Meckel on 06/03/2022.  The patient was brought to the emergency department by her daughters after they found her disoriented and almost unresponsive at home.  CT today revealed bilateral lower lobe lung consolidations concerning for pneumonia as well as extensive subcutaneous gas likely secondary to her laparoscopic procedure.  Currently, the patient is unable to answer historical questions due to disorientation, but both of her daughters are at bedside.  Per the patient's daughters, the patient had partial hemodialysis on 06/03/2022 due to hypotension and was discharged home from the hospital by the on-call urology physician.  Following dialysis the patient remained lethargic and had poor p.o. intake along with very little urine output.  Past Medical History:  Diagnosis Date   Anemia    COVID    patient states had COVID "over a month ago and in hospital 3 days"   Diabetes mellitus without complication ([place])    Gall stone    Gout    Hyperlipidemia    Hypertension    Renal disorder    kidney disease    Past Surgical History:  Procedure Laterality Date   A/V FISTULAGRAM Left 12/05/2021   Procedure: A/V Fistulagram;  Surgeon: Algernon Huxley, MD;  Location: [facility];  Service: Cardiovascular;  Laterality: Left;   A/V SHUNTOGRAM Left 02/16/2020   Procedure: A/V SHUNTOGRAM;  Surgeon: Algernon Huxley, MD;  Location: [facility];  Service: Cardiovascular;  Laterality: Left;   AV FISTULA PLACEMENT Left 01/15/2020   Procedure: INSERTION OF ARTERIOVENOUS (AV) GORE-TEX GRAFT ARM;  Surgeon: Algernon Huxley, MD;  Location: [facility];  Service: Vascular;  Laterality: Left;   COLONOSCOPY WITH PROPOFOL N/A 01/27/2016   Procedure: COLONOSCOPY WITH  PROPOFOL;  Surgeon: Hulen Luster, MD;  Location: [facility];  Service: Gastroenterology;  Laterality: N/A;   ROBOTIC ASSISTED LAPAROSCOPIC SACROCOLPOPEXY Bilateral 06/02/2022   Procedure: XI ROBOTIC ASSISTED LAPAROSCOPIC SACROCOLPOPEXY SUPRACEVICAL HYSTERECTOMY AND BILATERAL SALPINGO OOPHERECTOMY;  Surgeon: Ardis Hughs, MD;  Location: [facility];  Service: Urology;  Laterality: Bilateral;  4.5 HRS FOR THIS CASE   TUBAL LIGATION      Current Hospital Medications:  Home Meds:  No outpatient medications have been marked as taking for the 06/05/22 encounter [facility] Encounter).    Scheduled Meds:  [START ON 06/06/2022] Chlorhexidine Gluconate Cloth  6 each Topical Q0600   heparin  5,000 Units Subcutaneous Q8H   Continuous Infusions:  albumin human     [START ON 06/06/2022] ceFEPime (MAXIPIME) IV     [START ON 06/06/2022] metronidazole     sodium bicarbonate 150 mEq in dextrose 5 % 1,150 mL infusion 75 mL/hr at 06/05/22 1742   [START ON 06/06/2022] vancomycin     PRN Meds:.  Allergies:  Allergies  Allergen Reactions   Mircera [Methoxy Polyethylene Glycol-Epoetin Beta]     Patient doesn't recall this allergy    Family History  Problem Relation Age of Onset   Breast cancer Sister 53    Social History:  reports that she has quit smoking. She has never used smokeless tobacco. She reports that she does not drink alcohol and does not use drugs.  ROS: A complete review of systems was performed.  All systems are negative except for pertinent findings as noted.  Physical Exam:  Vital signs in last 24 hours: Temp:  [96.4 F (35.8 C)-98.2 F (36.8 C)] 98.1 F (36.7 C) (08/21 1730) Pulse Rate:  [75-97] 80 (08/21 1730) Resp:  [13-23] 13 (08/21 1730) BP: (94-146)/(35-91) 116/57 (08/21 1730) SpO2:  [90 %-99 %] 96 % (08/21 1730) Weight:  [56.2 kg] 56.2 kg (08/21 1138) Constitutional: Responsive to painful stimuli, but disoriented to person place and time Respiratory: Labored  breathing GI: Laparoscopic incisions are clean, dry and intact.  The abdomen is nondistended and nontender to palpation GU: Vulvar wound packing noted   Laboratory Data:  Recent Labs    06/03/22 0530 06/05/22 1347  WBC  --  16.1*  HGB 8.2* 7.8*  HCT 28.9* 26.6*  PLT  --  137*    Recent Labs    06/03/22 0530 06/05/22 1255  NA 138 139  K 5.2* 5.8*  CL 103 103  GLUCOSE 76 90  BUN 22 42*  CALCIUM 8.0* 8.4*  CREATININE 4.59* 6.80*     Results for orders placed or performed during the hospital encounter of 06/05/22 (from the past 24 hour(s))  CBG monitoring, ED     Status: None   Collection Time: 06/05/22 11:38 AM  Result Value Ref Range   Glucose-Capillary 70 70 - 99 mg/dL  Blood Culture (routine x 2)     Status: None (Preliminary result)   Collection Time: 06/05/22 12:25 PM   Specimen: BLOOD RIGHT ARM  Result Value Ref Range   Specimen Description      BLOOD RIGHT ARM UPPER Performed at [facility] [address] [place], [place] [address]    Special Requests      BOTTLES DRAWN AEROBIC AND ANAEROBIC Blood Culture adequate volume Performed at [facility] [address] [place], [place] [address]    Culture PENDING    Report Status PENDING   Lactic acid, plasma     Status: Abnormal   Collection Time: 06/05/22 12:54 PM  Result Value Ref Range   Lactic Acid, Venous 6.6 (HH) 0.5 - 1.9 mmol/L  Comprehensive metabolic panel     Status: Abnormal   Collection Time: 06/05/22 12:55 PM  Result Value Ref Range   Sodium 139 135 - 145 mmol/L   Potassium 5.8 (H) 3.5 - 5.1 mmol/L   Chloride 103 98 - 111 mmol/L   CO2 18 (L) 22 - 32 mmol/L   Glucose, Bld 90 70 - 99 mg/dL   BUN 42 (H) 8 - 23 mg/dL   Creatinine, Ser 6.80 (H) 0.44 - 1.00 mg/dL   Calcium 8.4 (L) 8.9 - 10.3 mg/dL   Total Protein 6.9 6.5 - 8.1 g/dL   Albumin 3.1 (L) 3.5 - 5.0 g/dL   AST 126 (H) 15 - 41 U/L   ALT 19 0 - 44 U/L   Alkaline Phosphatase 138 (H) 38 - 126 U/L   Total  Bilirubin <0.1 (L) 0.3 - 1.2 mg/dL   GFR, Estimated 6 (L) >60 mL/min   Anion gap 18 (H) 5 - 15  Protime-INR     Status: Abnormal   Collection Time: 06/05/22 12:55 PM  Result Value Ref Range   Prothrombin Time 19.8 (H) 11.4 - 15.2 seconds   INR 1.7 (H) 0.8 - 1.2  APTT     Status: None   Collection Time: 06/05/22 12:55 PM  Result Value Ref Range   aPTT 36 24 - 36 seconds  Urinalysis, Routine w reflex microscopic Urine, Catheterized  Status: Abnormal   Collection Time: 06/05/22  1:19 PM  Result Value Ref Range   Color, Urine YELLOW YELLOW   APPearance CLOUDY (A) CLEAR   Specific Gravity, Urine 1.016 1.005 - 1.030   pH 5.0 5.0 - 8.0   Glucose, UA NEGATIVE NEGATIVE mg/dL   Hgb urine dipstick LARGE (A) NEGATIVE   Bilirubin Urine NEGATIVE NEGATIVE   Ketones, ur NEGATIVE NEGATIVE mg/dL   Protein, ur 100 (A) NEGATIVE mg/dL   Nitrite NEGATIVE NEGATIVE   Leukocytes,Ua LARGE (A) NEGATIVE   RBC / HPF >50 (H) 0 - 5 RBC/hpf   WBC, UA >50 (H) 0 - 5 WBC/hpf   Bacteria, UA RARE (A) NONE SEEN   Squamous Epithelial / LPF 0-5 0 - 5   WBC Clumps PRESENT    Mucus PRESENT   CBC with Differential/Platelet     Status: Abnormal   Collection Time: 06/05/22  1:47 PM  Result Value Ref Range   WBC 16.1 (H) 4.0 - 10.5 K/uL   RBC 2.50 (L) 3.87 - 5.11 MIL/uL   Hemoglobin 7.8 (L) 12.0 - 15.0 g/dL   HCT 26.6 (L) 36.0 - 46.0 %   MCV 106.4 (H) 80.0 - 100.0 fL   MCH 31.2 26.0 - 34.0 pg   MCHC 29.3 (L) 30.0 - 36.0 g/dL   RDW 17.5 (H) 11.5 - 15.5 %   Platelets 137 (L) 150 - 400 K/uL   nRBC 0.3 (H) 0.0 - 0.2 %   Neutrophils Relative % 85 %   Neutro Abs 13.8 (H) 1.7 - 7.7 K/uL   Lymphocytes Relative 7 %   Lymphs Abs 1.1 0.7 - 4.0 K/uL   Monocytes Relative 7 %   Monocytes Absolute 1.1 (H) 0.1 - 1.0 K/uL   Eosinophils Relative 0 %   Eosinophils Absolute 0.0 0.0 - 0.5 K/uL   Basophils Relative 0 %   Basophils Absolute 0.0 0.0 - 0.1 K/uL   Immature Granulocytes 1 %   Abs Immature Granulocytes 0.08 (H)  0.00 - 0.07 K/uL  Blood gas, arterial (at [facility])     Status: Abnormal (Preliminary result)   Collection Time: 06/05/22  1:56 PM  Result Value Ref Range   pH, Arterial 7.26 (L) 7.35 - 7.45   pCO2 arterial 53 (H) 32 - 48 mmHg   pO2, Arterial 49 (L) 83 - 108 mmHg   Bicarbonate 23.8 20.0 - 28.0 mmol/L   Acid-base deficit 3.3 (H) 0.0 - 2.0 mmol/L   O2 Saturation 73.2 %   Patient temperature 37.0    Allens test (pass/fail) PENDING PASS  Lactic acid, plasma     Status: Abnormal   Collection Time: 06/05/22  3:11 PM  Result Value Ref Range   Lactic Acid, Venous 3.5 (HH) 0.5 - 1.9 mmol/L   Recent Results (from the past 240 hour(s))  Blood Culture (routine x 2)     Status: None (Preliminary result)   Collection Time: 06/05/22 12:25 PM   Specimen: BLOOD RIGHT ARM  Result Value Ref Range Status   Specimen Description   Final    BLOOD RIGHT ARM UPPER Performed at [facility], [address] [place], [place] [address]    Special Requests   Final    BOTTLES DRAWN AEROBIC AND ANAEROBIC Blood Culture adequate volume Performed at [facility] [address], [place], [place] [address]    Culture PENDING  Incomplete   Report Status PENDING  Incomplete    Renal Function: Recent Labs  06/02/22 0615 06/03/22 0530 06/05/22 1255  CREATININE 3.79* 4.59* 6.80*   Estimated Creatinine Clearance: 5.3 mL/min (A) (by C-G formula based on SCr of 6.8 mg/dL (H)).  Radiologic Imaging: CT ABDOMEN PELVIS W CONTRAST  Result Date: 06/05/2022 CLINICAL DATA:  Abdominal pain, acute, nonlocalized. Altered mental status. EXAM: CT ABDOMEN AND PELVIS WITH CONTRAST TECHNIQUE: Multidetector CT imaging of the abdomen and pelvis was performed using the standard protocol following bolus administration of intravenous contrast. RADIATION DOSE REDUCTION: This exam was performed according to the departmental dose-optimization program which includes automated exposure control, adjustment of the  mA and/or kV according to patient size and/or use of iterative reconstruction technique. CONTRAST:  45m OMNIPAQUE IOHEXOL 300 MG/ML  SOLN COMPARISON:  Noncontrast abdominopelvic CT 08/03/2021. FINDINGS: Lower chest: Moderate right and small left pleural effusion are noted with bilateral lower lobe consolidation suspicious for pneumonia or aspiration. Underlying diffuse interstitial prominence noted. The heart is mildly enlarged. Hepatobiliary: The liver is normal in density without suspicious focal abnormality. Extrahepatic biliary dilatation is similar to the previous study and may be physiologic status post cholecystectomy. No calcified intraductal calculi identified. Pancreas: Unremarkable. No pancreatic ductal dilatation or surrounding inflammatory changes. Spleen: Normal in size without focal abnormality. Adrenals/Urinary Tract: Both adrenal glands appear normal. The left kidney appears unremarkable. 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 Appearance is unchanged from the prior examination. A Foley catheter is in place with decompression of the bladder. There is possible bladder wall thickening and surrounding inflammation. Stomach/Bowel: No enteric contrast administered. The stomach appears unremarkable for its degree of distension. No evidence of bowel wall thickening, distention or surrounding inflammatory change. 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 Vascular/Lymphatic: There are no enlarged abdominal or pelvic lymph nodes. Aortic and branch vessel atherosclerosis without evidence of aneurysm or large vessel occlusion. The portal, superior mesenteric and splenic veins are patent. Reproductive: Hysterectomy.  No adnexal mass. Other: Possible small amount of free pelvic fluid. No focal extraluminal fluid or gas collections are seen within the peritoneal cavity or  retroperitoneum. However, there is extensive subcutaneous emphysema throughout the anterior abdominal wall extending into the perineum. Musculoskeletal: No acute or significant osseous findings. Diffuse thoracolumbar spondylosis with bridging osteophytes and facet hypertrophy. IMPRESSION: 1. 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 In discussion with Dr. RJeanell Sparrow patient underwent robotic assisted laparoscopic hysterectomy 3 days ago, and this emphysema is likely related to this procedure, not necessarily implying a complication or soft tissue infection. Correlate clinically. No pneumoperitoneum or focal fluid collection. 2. New bilateral pleural effusions with lower lobe consolidation suspicious for aspiration in the setting of recent surgery. 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 3. The bladder is decompressed by a Foley catheter. Possible bladder wall thickening which could be inflammatory. 4. 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 Chronic extrahepatic biliary dilatation post cholecystectomy, likely physiologic. Aortic Atherosclerosis (ICD10-I70.0). 5. These results were called by telephone at the time of interpretation on 06/05/2022 at 2:08 pm to provider [facility] , who verbally acknowledged these results. Electronically Signed   By: WRichardean SaleM.D.   On: 06/05/2022 14:09   CT Head Wo Contrast  Result Date: 06/05/2022 CLINICAL DATA:  Mental status change. EXAM: CT HEAD WITHOUT CONTRAST TECHNIQUE: Contiguous axial images were obtained from the base of the skull through the vertex  without intravenous contrast. RADIATION DOSE REDUCTION: This exam was performed according to the departmental dose-optimization program which includes automated exposure control, adjustment of the mA and/or kV according to patient size and/or use of iterative  reconstruction technique. COMPARISON:  None Available. FINDINGS: Brain: No evidence of acute infarction, hemorrhage, hydrocephalus, extra-axial collection or mass lesion/mass effect. Vascular: No hyperdense vessel or unexpected calcification. Skull: Normal. Negative for fracture or focal lesion. Sinuses/Orbits: No acute finding. Other: None. IMPRESSION: No acute intracranial abnormality. Electronically Signed   By: Keane Police D.O.   On: 06/05/2022 13:38   DG Chest Port 1 View  Result Date: 06/05/2022 CLINICAL DATA:  Found unresponsive. Altered mental status. History of hypertension and diabetes. EXAM: PORTABLE CHEST 1 VIEW COMPARISON:  Radiographs 10/16/2021 FINDINGS: 2024 hours. Lower lung volumes. Allowing for this, the heart size and mediastinal contours are stable. Probable underlying diffuse fibrotic changes in the lungs with superimposed bibasilar atelectasis attributed to the lower lung volumes. No definite edema, confluent airspace opacity, significant pleural effusion or pneumothorax. Degenerative changes throughout the spine without evidence of acute osseous abnormality. Telemetry leads overlie the chest. IMPRESSION: Stable cardiomegaly and chronic interstitial prominence attributed to underlying fibrosis. Allowing for suboptimal inspiration on the current study, no definite acute superimposed abnormality. Electronically Signed   By: Richardean Sale M.D.   On: 06/05/2022 12:14    I independently reviewed the above imaging studies.  Impression/Recommendation 81 year old female with acute respiratory failure and AMS following robotic sacrocolpopexy, hysterectomy and BSO on 06/03/2022.  History of ESRD requiring HD on Tuesday, Thursday and Saturday -No acute abdominal process noted on CT. -Wet-to-dry dressings to her vulvar wound -Appreciate CCM and nephrology's assistance  Ellison Hughs, MD [facility] 06/05/2022, 6:10 PM

## 2022-06-05 NOTE — ED Provider Notes (Signed)
[facility] DEPT Provider Note   CSN: [id number] Arrival date & time: 06/05/22  1116     History {Add pertinent medical, surgical, social history, OB history to HPI:1} Chief Complaint  Patient presents with   Altered Mental Status    Krista Gordon is a 81 y.o. female.  HPI Level 5 caveat 81 yo female presents today for mental status.  Patient reported to have surgery on Friday.  EMS called for altered mental status no further history obtained at the this time Reviewed chart and patient had robotic assisted laparoscopic supracervical hysterectomy and bilateral salpingo-oophorectomy on 818 was discharged home on 819. Noted from records that patient is dialysis patient Tuesday Thursday Saturday     Home Medications Prior to Admission medications   Medication Sig Start Date End Date Taking? Authorizing Provider  amLODipine (NORVASC) 5 MG tablet Take 5 mg by mouth daily. 01/02/22   [provider]  atorvastatin (LIPITOR) 40 MG tablet Take 40 mg by mouth at bedtime.     [provider]  cephALEXin (KEFLEX) 500 MG capsule Take 1 capsule (500 mg total) by mouth at bedtime for 5 days. 06/03/22 06/08/22  Cleon Gustin, MD  colchicine 0.6 MG tablet Take 0.6 mg by mouth every other day.    [provider]  estradiol (ESTRACE VAGINAL) 0.1 MG/GM vaginal cream Place 1 Applicatorful vaginally 3 (three) times a week. Use 1 small dolyp of cream on tip of index finger and swap the inside of the vagina 06/02/22   Ardis Hughs, MD  famotidine (PEPCID) 10 MG tablet Take 1 tablet (10 mg total) by mouth daily. Patient not taking: Reported on 05/15/2022 10/20/21 12/05/21  Kayleen Memos, DO  ferrous sulfate 325 (65 FE) MG tablet Take 325 mg by mouth daily.     [provider]  lisinopril (ZESTRIL) 40 MG tablet Take 40 mg by mouth daily.    [provider]  loratadine (CLARITIN) 10 MG tablet Take 10 mg by mouth every other  day. Patient not taking: Reported on 05/15/2022 01/02/22   [provider]  multivitamin (RENA-VIT) TABS tablet Take 1 tablet by mouth at bedtime. Patient not taking: Reported on 05/15/2022 10/20/21   Kayleen Memos, DO  mupirocin ointment (BACTROBAN) 2 % Apply topically 2 (two) times daily. Patient not taking: Reported on 05/15/2022 10/20/21   Kayleen Memos, DO  PROAIR HFA 108 [id number] Base) MCG/ACT inhaler Inhale 2 puffs into the lungs every 6 (six) hours as needed for shortness of breath. 06/15/18   [provider]  sodium bicarbonate 650 MG tablet Take 1,300 mg by mouth 2 (two) times daily.    [provider]  traMADol (ULTRAM) 50 MG tablet Take 1-2 tablets (50-100 mg total) by mouth every 6 (six) hours as needed for moderate pain or severe pain. 06/02/22   Debbrah Alar, PA-C      Allergies    Mircera [methoxy polyethylene glycol-epoetin beta]    Review of Systems   Review of Systems  Physical Exam Updated Vital Signs BP (!) 146/58   Pulse 81   Temp 97.9 F (36.6 C)   Resp (!) 22   Ht 1.575 m ('5\' 2"'$ )   Wt 56.2 kg   SpO2 96%   BMI 22.66 kg/m  Physical Exam Vitals and nursing note reviewed.  Constitutional:      General: She is not in acute distress.    Appearance: She is ill-appearing.  HENT:  Head: Normocephalic.     Right Ear: External ear normal.     Left Ear: External ear normal.     Nose: Nose normal.     Mouth/Throat:     Pharynx: Oropharynx is clear.  Cardiovascular:     Rate and Rhythm: Normal rate.  Pulmonary:     Effort: Pulmonary effort is normal.  Abdominal:     Palpations: Abdomen is soft.     Tenderness: There is abdominal tenderness.     Comments: Left groin wound noted with packing in place  Musculoskeletal:     Cervical back: Normal range of motion.    ED Results / Procedures / Treatments   Labs (all labs ordered are listed, but only abnormal results are displayed) Labs Reviewed  URINALYSIS, ROUTINE W REFLEX MICROSCOPIC -  Abnormal; Notable for the following components:      Result Value   APPearance CLOUDY (*)    Hgb urine dipstick LARGE (*)    Protein, ur 100 (*)    Leukocytes,Ua LARGE (*)    RBC / HPF >50 (*)    WBC, UA >50 (*)    Bacteria, UA RARE (*)    All other components within normal limits  LACTIC ACID, PLASMA - Abnormal; Notable for the following components:   Lactic Acid, Venous 6.6 (*)    All other components within normal limits  COMPREHENSIVE METABOLIC PANEL - Abnormal; Notable for the following components:   Potassium 5.8 (*)    CO2 18 (*)    BUN 42 (*)    Creatinine, Ser 6.80 (*)    Calcium 8.4 (*)    Albumin 3.1 (*)    AST 126 (*)    Alkaline Phosphatase 138 (*)    Total Bilirubin <0.1 (*)    GFR, Estimated 6 (*)    Anion gap 18 (*)    All other components within normal limits  PROTIME-INR - Abnormal; Notable for the following components:   Prothrombin Time 19.8 (*)    INR 1.7 (*)    All other components within normal limits  CBC WITH DIFFERENTIAL/PLATELET - Abnormal; Notable for the following components:   WBC 16.1 (*)    RBC 2.50 (*)    Hemoglobin 7.8 (*)    HCT 26.6 (*)    MCV 106.4 (*)    MCHC 29.3 (*)    RDW 17.5 (*)    Platelets 137 (*)    nRBC 0.3 (*)    Neutro Abs 13.8 (*)    Monocytes Absolute 1.1 (*)    Abs Immature Granulocytes 0.08 (*)    All other components within normal limits  CULTURE, BLOOD (ROUTINE X 2)  CULTURE, BLOOD (ROUTINE X 2)  URINE CULTURE  APTT  LACTIC ACID, PLASMA  CBC WITH DIFFERENTIAL/PLATELET  BLOOD GAS, ARTERIAL  CBG MONITORING, ED    EKG EKG Interpretation  Date/Time:  Monday June 05 2022 14:06:35 EDT Ventricular Rate:  97 PR Interval:  154 QRS Duration: 98 QT Interval:  362 QTC Calculation: 460 R Axis:   31 Text Interpretation: Atrial fibrillation Anterior infarct, old Confirmed by Pattricia Boss [phone]) on 06/05/2022 2:39:51 PM  Radiology CT ABDOMEN PELVIS W CONTRAST  Result Date: 06/05/2022 CLINICAL DATA:   Abdominal pain, acute, nonlocalized. Altered mental status. EXAM: CT ABDOMEN AND PELVIS WITH CONTRAST TECHNIQUE: Multidetector CT imaging of the abdomen and pelvis was performed using the standard protocol following bolus administration of intravenous contrast. RADIATION DOSE REDUCTION: This exam was performed according to the departmental dose-optimization  program which includes automated exposure control, adjustment of the mA and/or kV according to patient size and/or use of iterative reconstruction technique. CONTRAST:  9m OMNIPAQUE IOHEXOL 300 MG/ML  SOLN COMPARISON:  Noncontrast abdominopelvic CT 08/03/2021. FINDINGS: Lower chest: Moderate right and small left pleural effusion are noted with bilateral lower lobe consolidation suspicious for pneumonia or aspiration. Underlying diffuse interstitial prominence noted. The heart is mildly enlarged. Hepatobiliary: The liver is normal in density without suspicious focal abnormality. Extrahepatic biliary dilatation is similar to the previous study and may be physiologic status post cholecystectomy. No calcified intraductal calculi identified. Pancreas: Unremarkable. No pancreatic ductal dilatation or surrounding inflammatory changes. Spleen: Normal in size without focal abnormality. Adrenals/Urinary Tract: Both adrenal glands appear normal. The left kidney appears unremarkable. 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 Appearance is unchanged from the prior examination. A Foley catheter is in place with decompression of the bladder. There is possible bladder wall thickening and surrounding inflammation. Stomach/Bowel: No enteric contrast administered. The stomach appears unremarkable for its degree of distension. No evidence of bowel wall thickening, distention or surrounding inflammatory change. 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  Vascular/Lymphatic: There are no enlarged abdominal or pelvic lymph nodes. Aortic and branch vessel atherosclerosis without evidence of aneurysm or large vessel occlusion. The portal, superior mesenteric and splenic veins are patent. Reproductive: Hysterectomy.  No adnexal mass. Other: Possible small amount of free pelvic fluid. No focal extraluminal fluid or gas collections are seen within the peritoneal cavity or retroperitoneum. However, there is extensive subcutaneous emphysema throughout the anterior abdominal wall extending into the perineum. Musculoskeletal: No acute or significant osseous findings. Diffuse thoracolumbar spondylosis with bridging osteophytes and facet hypertrophy. IMPRESSION: 1. 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 In discussion with Dr. RJeanell Sparrow patient underwent robotic assisted laparoscopic hysterectomy 3 days ago, and this emphysema is likely related to this procedure, not necessarily implying a complication or soft tissue infection. Correlate clinically. No pneumoperitoneum or focal fluid collection. 2. New bilateral pleural effusions with lower lobe consolidation suspicious for aspiration in the setting of recent surgery. 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 3. The bladder is decompressed by a Foley catheter. Possible bladder wall thickening which could be inflammatory. 4. 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 Chronic extrahepatic biliary dilatation post cholecystectomy, likely physiologic. Aortic Atherosclerosis (ICD10-I70.0). 5. These results were called by telephone at the time of interpretation on 06/05/2022 at 2:08 pm to [facility] , who verbally acknowledged these results. Electronically Signed   By: WRichardean SaleM.D.   On: 06/05/2022 14:09   CT Head Wo Contrast  Result Date: 06/05/2022 CLINICAL DATA:   Mental status change. EXAM: CT HEAD WITHOUT CONTRAST TECHNIQUE: Contiguous axial images were obtained from the base of the skull through the vertex without intravenous contrast. RADIATION DOSE REDUCTION: This exam was performed according to the departmental dose-optimization program which includes automated exposure control, adjustment of the mA and/or kV according to patient size and/or use of iterative reconstruction technique. COMPARISON:  None Available. FINDINGS: Brain: No evidence of acute infarction, hemorrhage, hydrocephalus, extra-axial collection or mass lesion/mass effect. Vascular: No hyperdense vessel or unexpected calcification. Skull: Normal. Negative for fracture or focal lesion. Sinuses/Orbits: No acute finding. Other: None. IMPRESSION: No acute intracranial abnormality. Electronically Signed   By: IJudye BosO.  On: 06/05/2022 13:38   DG Chest Port 1 View  Result Date: 06/05/2022 CLINICAL DATA:  Found unresponsive. Altered mental status. History of hypertension and diabetes. EXAM: PORTABLE CHEST 1 VIEW COMPARISON:  Radiographs 10/16/2021 FINDINGS: 2024 hours. Lower lung volumes. Allowing for this, the heart size and mediastinal contours are stable. Probable underlying diffuse fibrotic changes in the lungs with superimposed bibasilar atelectasis attributed to the lower lung volumes. No definite edema, confluent airspace opacity, significant pleural effusion or pneumothorax. Degenerative changes throughout the spine without evidence of acute osseous abnormality. Telemetry leads overlie the chest. IMPRESSION: Stable cardiomegaly and chronic interstitial prominence attributed to underlying fibrosis. Allowing for suboptimal inspiration on the current study, no definite acute superimposed abnormality. Electronically Signed   By: Richardean Sale M.D.   On: 06/05/2022 12:14    Procedures Procedures  {Document cardiac monitor, telemetry assessment procedure when appropriate:1}  Medications  Ordered in ED Medications  lactated ringers infusion (has no administration in time range)  metroNIDAZOLE (FLAGYL) IVPB 500 mg (has no administration in time range)  vancomycin (VANCOREADY) IVPB 1250 mg/250 mL (has no administration in time range)  lactated ringers bolus 1,000 mL (0 mLs Intravenous Stopped 06/05/22 1329)  sodium chloride (PF) 0.9 % injection (  Given by Other 06/05/22 1316)  iohexol (OMNIPAQUE) 300 MG/ML solution 75 mL (75 mLs Intravenous Contrast Given 06/05/22 1312)  ceFEPIme (MAXIPIME) 2 g in sodium chloride 0.9 % 100 mL IVPB (2 g Intravenous New Bag/Given 06/05/22 1401)  lactated ringers bolus 510 mL (510 mLs Intravenous New Bag/Given 06/05/22 1402)    ED Course/ Medical Decision Making/ A&P Clinical Course as of 06/05/22 1444  Mon Jun 05, 2022  1339 Elevated lactic acid at 6.6 [DR]  1339 Greater than 50 white blood cells and large LE noted on urinalysis although rare bacteria [DR]  1340 Last x-Yzabella Crunk reviewed interpreted no evidence of acute abnormality  [DR]  1351 CT Head Wo Contrast [KD]  1411 CBC reviewed interpreted significant for leukocytosis and anemia [DR]  1411 Anemia consistent with recent surgery Reviewed complete metabolic panel and difficult for per kalemia with potassium of 5.8, elevated BUN and creatinine consistent with her known kidney failure [DR]  1412 Lactic acid called to be elevated at 6.6 [DR]  1438 CT head without any evidence of acute abnormality [DR]    Clinical Course User Index [DR] Pattricia Boss, MD [KD] Dierdre Forth                           Medical Decision Making 81 year old female who presents today with altered mental status.  Patient had surgery on Friday with robotic hysterectomy and oophorectomy.  She was discharged on Saturday.  She has been generally weak not as alert since that time.  Today she was very weak and required assistance to sit up. On evaluation here patient was borderline hypotensive.  She was not very  responsive on my initial exam Sugar was checked and was normal Evaluated with labs, imaging, and EKG Lactic acid elevated at 6.6 Differential diagnosis of her infectious etiology includes bilateral infiltrates noted in bilateral lower lobes, greater than 50 white blood cells in urine would consider urinary tract infection, patient with wound from recent surgery with soft tissue emphysema  most likely consistent with her recent surgery although cannot rule out infectious etiology. Patient treated here with IV fluids at 50 cc/kg based on weight of 52 kg. She is given broad-spectrum antibiotics Oxygen saturations have varied  and are currently at 93 to 94% on nasal cannula Will obtain ABG Discussed with daughter and sister at bedside.  They report full code. 1- sepsis 2- ams-likely secondary to #1.  CT of head obtained and no evidence of acute intracranial abnormality.  Blood sugars within normal limits Patient is on tramadol and this could be contributing to her decreased responsiveness 3 patient with end-stage renal disease 4 hyperkalemia likely secondary to #3 patient is received IV fluids and will recheck 5 EKG consistent with A-fib appears to be new onset   Amount and/or Complexity of Data Reviewed External Data Reviewed: notes.    Details: Recent surgical intervention reviewed Labs: ordered. Decision-making details documented in ED Course. Radiology: ordered and independent interpretation performed. Decision-making details documented in ED Course.    Details: Gust CT results of abdomen pelvis with Dr. Lin Landsman ECG/medicine tests: ordered and independent interpretation performed. Decision-making details documented in ED Course.    Details: Patient maintained on monitor with heart rate verified at 25, however it is 80 and has pulses noted. Discussion of management or test interpretation with external provider(s): Care discussed with Dr. Lin Landsman, of radiology Care discussed with Dr.  Chase Caller, on-call for critical care Care discussed with Dr. Lovena Neighbours, on-call for urology  Risk Prescription drug management. Parenteral controlled substances. Decision regarding hospitalization.  Critical Care Total time providing critical care: 75 minutes     {Document critical care time when appropriate:1} {Document review of labs and clinical decision tools ie heart score, Chads2Vasc2 etc:1}  {Document your independent review of radiology images, and any outside records:1} {Document your discussion with family members, caretakers, and with consultants:1} {Document social determinants of health affecting pt's care:1} {Document your decision making why or why not admission, treatments were needed:1} Final Clinical Impression(s) / ED Diagnoses Final diagnoses:  None    Rx / DC Orders ED Discharge Orders     None

## 2022-06-05 NOTE — Progress Notes (Signed)
Asked to see for esrd. TTS HD at [facility] in [place], [place] [facility].  K+ here 5.8, last HD Sat , partial x 1 hr. Here w/ AMS, hypotension, ^WBC a few days after pelvic floor surgery which happened on 8/18. Per CCM pt to be admitted to ICU at [facility].  Creat 6, no vol excess. No strong indication for acute HD. BP's better after IVF"s, IV abx and IV albumin. K+ to be repeated tonight after temporizing measures given to see if there is need for CRRT or not.  Have written HD orders for tomorrow. Full consult to follow.   Kelly Splinter, MD 06/05/2022, 5:14 PM

## 2022-06-05 NOTE — Progress Notes (Signed)
Patient more confused, pulling at IV lines, attempting to pull foley out, swatting at staff  and attempting to get out of bed, despite reorientation attempts. Staff at beside for safety.   Sheppard Penton RN

## 2022-06-05 NOTE — H&P (Signed)
NAME:  Krista Gordon, MRN:  [id number], DOB:  Oct 26, 1940, LOS: 0 ADMISSION DATE:  06/05/2022, CONSULTATION DATE:  06/05/2022 REFERRING MD:  Dr. 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  Patient recently underwent robotic assisted laparoscopic hysterectomy, bilateral salpingo-oopherectomy, and sacrocolpopexy for pelvic organ prolapse on 8/18, discharged home on 8/19 with tramadol.  Family report she wasn't at her baseline when she was discharged, was confused, but they were trying to get her home for her scheduled dialysis on Saturday.  However, she was only able to tolerate one hour due to hypotension.  Since, family report ongoing weakness, lethargy, poor PO intake, with patient primarily sleeping since.  At baseline, family reports patient is fully dependent in her ADLs, drives, and still works as Neurosurgeon.   Found by EMS with SBP 79, CBG 64, room air saturation 79%, and responsive to verbal/ painful stimuli. O2 improved with 3L O2 and given glucagon.  IV access only able to be obtained in ER.  Was afebrile with improved SBP in the 90's.  Given 1.5L LR.  Labs noted for WBC 16.1, H/H 7.8/ 26.6 (10.1/ 35.7> 8.2/ 28.9 on discharge 8/19), plts 137, K 5.8, bicarb 18, BUN 42, sCr 6.8, alk phos 138, albumin 3.1, AST 126, INR 1.7, UA with large Hgb, RBC> 50, leukocytes, WBC> 50.  EKG noted afib, rate 90's (afib new for patient).  CXR noted for low lung volumes, stable cardiomegaly, chronic interstitial prominence attributed to underlying fibrosis.  CT head negative for acute intracranial abnormality.  CT abd/ pelvis noted new bilateral pleural effusions with bilateral lower lobe consolidation with evidence of underlying chronic fibrotic lung disease, and extensive soft tissue emphysema throughout the anterior abdominal wall with extension into  the perineum, likely related to recent surgery but no overt focal fluid collection or pneumoperitoneum.  Blood and urine cultures sent and started on empiric antibiotics.  PCCM called for admit.   Pertinent  Medical History  ESRD on iHD TTS, DM, HTN, HLD, anemia, gallstones, gout, kidney stones, pelvic organ prolapse  Significant Hospital Events: Including procedures, antibiotic start and stop dates in addition to other pertinent events     Interim History / Subjective:   Objective   Blood pressure (!) 130/59, pulse 79, temperature 98 F (36.7 C), resp. rate 13, height $RemoveBe'5\' 2"'HydnMXhWK$  (1.575 m), weight 56.2 kg, SpO2 93 %.        Intake/Output Summary (Last 24 hours) at 06/05/2022 1437 Last data filed at 06/05/2022 1329 Gross per 24 hour  Intake 1000 ml  Output --  Net 1000 ml   Filed Weights   06/05/22 1138  Weight: 56.2 kg    Examination: General:  AoC ill appearing elderly lady lying on ER stretcher, no obvious distress HEENT: MM pink/mildly moist, pupils 3/reactive, anicteric, arcus senilis Neuro: lethargic but arouses, oriented to name, year, place.  MAE w/generalized weakness CV: rr, currently in NSR.  No murmur PULM:  non labored, not tachypneic, congested np cough, clear anteriorly, rales left base and diminished bibasilar   GI: obese, soft, +bs, mild diffuse tenderness, port sites intact, approximated/ glued- wnl, has left groin wound with packing in place> no foul odor or drainage Extremities: warm/dry, no LE edema, AVF left forearm +b/t Skin: no rashes   Labs/ Imaging  8/21 CTH> neg 8/21 CT  a/p  Resolved Hospital Problem list    Assessment & Plan:   Severe sepsis, likely related to aspiration pneumonia, possible UTI and can not fully rule out intra-abdominal process but less likely - admit to ICU for close monitoring  - currently hemodynamically stable but MAPs borderline - repeat lactic 6.6> 3.5 - s/p 1.5L in ER.  May need more fluids given very poor PO status since  Friday.  Albumin x1 now - goal MAP > 60-65 - pan-culture - cont empiric cefepime, flagyl, vanc for now   Acute hypoxic and mild hypercarbic respiratory failure Aspiration pneumonia Bilateral pleural effusions, R> L Likely underlying pulmonary fibrosis, undiagnosed  - high risk for intubation - may consider BiPAP to help clear hypercarbia in ICU only with close monitoring as long as mental status maintains.  She currently is lethargic but able to to wake up and f/c - VBG if worsening mental status - NPO/ aspiration precautions - supplemental O2 prn for sat goal > 92% - abx as above - prn albuterol  - sputum cx if intubated - CXR in am  - volume removal with dialysis - on previous imaging review, patient has fibrotic changes with slight honeycombing dating back to 2020 on CT renal studies.  Is not on home O2.  Once these acute issues resolved, patient will clinic follow up and HRCT.     ESRD on iHD TTS with hyperkalemia without EKG changes AGMA/ lactic acidosis  - 8/19 iHD for only 1hr due to hypotension - tele monitoring - chemical dialysis > calcium, insulin, D50, bicarb now.  If mental status improves> lokelmia PO.   - bicarb gtt for now - repeat BMET at 2100 - nephrology consulted, appreciate assistance.  Agrees with tx to [facility].  Likely will try iHD, BP but may need CRRT.  Could consider adding midodrine (if mental status allows vs peripheral vasopressors for iHD vs CRRT) - trend renal indices/ strict I/Os   Acute metabolic/ septic encephalopathy - CTH neg - frequent neuro checks, currently non-focal - supportive care as above - check ammonia    PAF- new onset  - tele monitor, currently in NSR - afib noted on EKG - CHA2DS2-VASc score 5, will need to address anticoagulation.  If Hgb remains stable, consider heparin gtt then bridge to DOAC given recent surgery.    Anemia of chronic disease + mild component of ABLA - baseline Hgb 9-10.  Hgb 10.1 pre-op down to 8.2 after  surgery.  Today 7.8 without signs of bleeding.  Continue to monitor/ trend.  Transfuse for Hgb< 7   Mild Transaminitis  - alk phos and AST elevated - repeat in am, consider RUQ [place], CT a/p noted chronic extrahepatic biliary dilatation post cholecystectomy, likely physiologic   Coagulopathy - INR 1.7 Chronic thrombocytopenia- at baseline - repeat INR in am, trend plts on CBC -  no current evidence of bleeding.  Continue to monitor    DM with Hypoglycemia, resolved, at risk while NPO - likely related to NPO/ sepsis - trend CBG q 4, hypoglycemia protocol prn.  Going to get D5 with bicarb should help   Pelvic organ prolapse s/p recent laparoscopic hysterectomy, bilateral salpingo-oopherectomy, and sacrocolpopexy by Dr. Louis Meckel 8/18 - CT a/p with changes c/w post surgery.  Trend clinical abd exams.  - urology aware of admit  Best Practice (right click and "Reselect all SmartList Selections" daily)   Diet/type: NPO DVT prophylaxis: prophylactic heparin  GI prophylaxis: PPI Lines: N/A Foley:  Yes, and it is  still needed Code Status:  full code Last date of multidisciplinary goals of care discussion - daughter, sisters x 2, and sister-in-law updated at bedside  Labs   CBC: Recent Labs  Lab 06/02/22 0615 06/03/22 0530 06/05/22 1347  WBC 6.2  --  16.1*  NEUTROABS 3.5  --  13.8*  HGB 10.1* 8.2* 7.8*  HCT 35.7* 28.9* 26.6*  MCV 104.7*  --  106.4*  PLT 121*  --  137*    Basic Metabolic Panel: Recent Labs  Lab 06/02/22 0615 06/03/22 0530 06/05/22 1255  NA 144 138 139  K 4.0 5.2* 5.8*  CL 107 103 103  CO2 29 27 18*  GLUCOSE 84 76 90  BUN 12 22 42*  CREATININE 3.79* 4.59* 6.80*  CALCIUM 9.0 8.0* 8.4*   GFR: Estimated Creatinine Clearance: 5.3 mL/min (A) (by C-G formula based on SCr of 6.8 mg/dL (H)). Recent Labs  Lab 06/02/22 0615 06/05/22 1254 06/05/22 1347  WBC 6.2  --  16.1*  LATICACIDVEN  --  6.6*  --     Liver Function Tests: Recent Labs  Lab  06/02/22 0615 06/05/22 1255  AST 21 126*  ALT 10 19  ALKPHOS 82 138*  BILITOT 0.7 <0.1*  PROT 7.0 6.9  ALBUMIN 3.2* 3.1*   No results for input(s): "LIPASE", "AMYLASE" in the last 168 hours. No results for input(s): "AMMONIA" in the last 168 hours.  ABG    Component Value Date/Time   TCO2 24 01/15/2020 1130     Coagulation Profile: Recent Labs  Lab 06/05/22 1255  INR 1.7*    Cardiac Enzymes: No results for input(s): "CKTOTAL", "CKMB", "CKMBINDEX", "TROPONINI" in the last 168 hours.  HbA1C: Hgb A1c MFr Bld  Date/Time Value Ref Range Status  06/02/2022 06:15 AM 4.0 (L) 4.8 - 5.6 % Final    Comment:    (NOTE) Pre diabetes:          5.7%-6.4%  Diabetes:              >6.4%  Glycemic control for   <7.0% adults with diabetes   10/17/2021 05:11 AM 4.3 (L) 4.8 - 5.6 % Final    Comment:    (NOTE) Pre diabetes:          5.7%-6.4%  Diabetes:              >6.4%  Glycemic control for   <7.0% adults with diabetes     CBG: Recent Labs  Lab 06/02/22 0552 06/02/22 1203 06/05/22 1138  GLUCAP 70 194* 70    Review of Systems:   Unable given encephalopathy   Past Medical History:  She,  has a past medical history of Anemia, COVID, Diabetes mellitus without complication ([place]), Gall stone, Gout, Hyperlipidemia, Hypertension, and Renal disorder.   Surgical History:   Past Surgical History:  Procedure Laterality Date   A/V FISTULAGRAM Left 12/05/2021   Procedure: A/V Fistulagram;  Surgeon: Algernon Huxley, MD;  Location: [place] [facility];  Service: Cardiovascular;  Laterality: Left;   A/V SHUNTOGRAM Left 02/16/2020   Procedure: A/V SHUNTOGRAM;  Surgeon: Algernon Huxley, MD;  Location: [place];  Service: Cardiovascular;  Laterality: Left;   AV FISTULA PLACEMENT Left 01/15/2020   Procedure: INSERTION OF ARTERIOVENOUS (AV) GORE-TEX GRAFT ARM;  Surgeon: Algernon Huxley, MD;  Location: [facility];  Service: Vascular;  Laterality: Left;   COLONOSCOPY WITH PROPOFOL N/A  01/27/2016   Procedure: COLONOSCOPY WITH PROPOFOL;  Surgeon: Hulen Luster, MD;  Location: [facility];  Service: Gastroenterology;  Laterality: N/A;   ROBOTIC ASSISTED LAPAROSCOPIC SACROCOLPOPEXY Bilateral 06/02/2022   Procedure: XI ROBOTIC ASSISTED LAPAROSCOPIC SACROCOLPOPEXY SUPRACEVICAL HYSTERECTOMY AND BILATERAL SALPINGO OOPHERECTOMY;  Surgeon: Ardis Hughs, MD;  Location: [facility];  Service: Urology;  Laterality: Bilateral;  4.5 HRS FOR THIS CASE   TUBAL LIGATION       Social History:   reports that she has quit smoking. She has never used smokeless tobacco. She reports that she does not drink alcohol and does not use drugs.   Family History:  Her family history includes Breast cancer (age of onset: 32) in her sister.   Allergies Allergies  Allergen Reactions   Mircera [Methoxy Polyethylene Glycol-Epoetin Beta]     Patient doesn't recall this allergy     Home Medications  Prior to Admission medications   Medication Sig Start Date End Date Taking? Authorizing Provider  amLODipine (NORVASC) 5 MG tablet Take 5 mg by mouth daily. 01/02/22   [provider]  atorvastatin (LIPITOR) 40 MG tablet Take 40 mg by mouth at bedtime.     [provider]  cephALEXin (KEFLEX) 500 MG capsule Take 1 capsule (500 mg total) by mouth at bedtime for 5 days. 06/03/22 06/08/22  Cleon Gustin, MD  colchicine 0.6 MG tablet Take 0.6 mg by mouth every other day.    [provider]  estradiol (ESTRACE VAGINAL) 0.1 MG/GM vaginal cream Place 1 Applicatorful vaginally 3 (three) times a week. Use 1 small dolyp of cream on tip of index finger and swap the inside of the vagina 06/02/22   Ardis Hughs, MD  famotidine (PEPCID) 10 MG tablet Take 1 tablet (10 mg total) by mouth daily. Patient not taking: Reported on 05/15/2022 10/20/21 12/05/21  Kayleen Memos, DO  ferrous sulfate 325 (65 FE) MG tablet Take 325 mg by mouth daily.     [provider]  lisinopril (ZESTRIL) 40  MG tablet Take 40 mg by mouth daily.    [provider]  loratadine (CLARITIN) 10 MG tablet Take 10 mg by mouth every other day. Patient not taking: Reported on 05/15/2022 01/02/22   [provider]  multivitamin (RENA-VIT) TABS tablet Take 1 tablet by mouth at bedtime. Patient not taking: Reported on 05/15/2022 10/20/21   Kayleen Memos, DO  mupirocin ointment (BACTROBAN) 2 % Apply topically 2 (two) times daily. Patient not taking: Reported on 05/15/2022 10/20/21   Kayleen Memos, DO  PROAIR HFA 108 [phone] Base) MCG/ACT inhaler Inhale 2 puffs into the lungs every 6 (six) hours as needed for shortness of breath. 06/15/18   [provider]  sodium bicarbonate 650 MG tablet Take 1,300 mg by mouth 2 (two) times daily.    [provider]  traMADol (ULTRAM) 50 MG tablet Take 1-2 tablets (50-100 mg total) by mouth every 6 (six) hours as needed for moderate pain or severe pain. 06/02/22   Debbrah Alar, PA-C     Critical care time: 70 mins       Kennieth Rad, MSN, AG-ACNP-BC [facility] 06/05/2022, 5:01 PM  See Amion for pager If no response to pager, please call PCCM consult pager After 7:00 pm call Elink

## 2022-06-05 NOTE — Progress Notes (Addendum)
eLink Physician-Brief Progress Note Patient Name: BRAYLI KLINGBEIL DOB: June 14, 1941 MRN: [id number]   Date of Service  06/05/2022  HPI/Events of Note  37F with ESRD admitted for severe sepsis secondary to suspected aspiration PNA. Admitted to ICU for ICU monitoring for high risk for intubation  No pressor requirement. On bicarb gtt. Currently protecting airway on 3L. Intermittently follows commands. LA 6.6>3.5  eICU Interventions  Trend LA. If elevated will give additional IVF ADDENDUM: Unable to obtain labs due to agitation and difficult stick. Give additional 500 cc bolus at this time   11:31 PM Agitation requiring restraints to prevent pulling of lines. Bilateral wrist restraints ordered.  06/06/22 12:55 AM K elevated 7.2. No hemolysis commented however ammonia reports hemolysis. Discussed Nephrology. Patient given 2 amps bicarb now. Will repeat labs. Nephrology plans for patient to receive HD next    06/06/22 2:22 AM K 5.9. LA improved to 2.7. Plan for HD  Sharan Mcenaney Rodman Pickle 06/05/2022, 8:50 PM

## 2022-06-05 NOTE — ED Notes (Signed)
Unsuccessful IV attempt x 3, RN at bedside attempting [place] IV at this time

## 2022-06-05 NOTE — ED Notes (Signed)
Consent signed by pt daughter for albumin infusion

## 2022-06-05 NOTE — ED Notes (Signed)
Mitts placed on pt hands per family request, due to pt pulling at lines and wires

## 2022-06-06 ENCOUNTER — Inpatient Hospital Stay (HOSPITAL_COMMUNITY): Payer: Medicare Other

## 2022-06-06 DIAGNOSIS — I428 Other cardiomyopathies: Secondary | ICD-10-CM | POA: Diagnosis not present

## 2022-06-06 DIAGNOSIS — R6520 Severe sepsis without septic shock: Secondary | ICD-10-CM

## 2022-06-06 DIAGNOSIS — A4102 Sepsis due to Methicillin resistant Staphylococcus aureus: Secondary | ICD-10-CM | POA: Diagnosis not present

## 2022-06-06 DIAGNOSIS — N171 Acute kidney failure with acute cortical necrosis: Secondary | ICD-10-CM

## 2022-06-06 DIAGNOSIS — R4182 Altered mental status, unspecified: Secondary | ICD-10-CM | POA: Diagnosis not present

## 2022-06-06 LAB — BASIC METABOLIC PANEL
Anion gap: 18 — ABNORMAL HIGH (ref 5–15)
Anion gap: 13 (ref 5–15)
BUN: 48 mg/dL — ABNORMAL HIGH (ref 8–23)
BUN: 45 mg/dL — ABNORMAL HIGH (ref 8–23)
CO2: 21 mmol/L — ABNORMAL LOW (ref 22–32)
CO2: 25 mmol/L (ref 22–32)
Calcium: 8.6 mg/dL — ABNORMAL LOW (ref 8.9–10.3)
Calcium: 7.6 mg/dL — ABNORMAL LOW (ref 8.9–10.3)
Chloride: 104 mmol/L (ref 98–111)
Chloride: 101 mmol/L (ref 98–111)
Creatinine, Ser: 6.51 mg/dL — ABNORMAL HIGH (ref 0.44–1.00)
Creatinine, Ser: 6.26 mg/dL — ABNORMAL HIGH (ref 0.44–1.00)
GFR, Estimated: 6 mL/min — ABNORMAL LOW (ref >60)
GFR, Estimated: 6 mL/min — ABNORMAL LOW (ref >60)
Glucose, Bld: 123 mg/dL — ABNORMAL HIGH (ref 70–99)
Glucose, Bld: 131 mg/dL — ABNORMAL HIGH (ref 70–99)
Potassium: 7.2 mmol/L (ref 3.5–5.1)
Potassium: 5.9 mmol/L — ABNORMAL HIGH (ref 3.5–5.1)
Sodium: 143 mmol/L (ref 135–145)
Sodium: 139 mmol/L (ref 135–145)

## 2022-06-06 LAB — CBC
HCT: 23.5 % — ABNORMAL LOW (ref 36.0–46.0)
Hemoglobin: 7.1 g/dL — ABNORMAL LOW (ref 12.0–15.0)
MCH: 30.7 pg (ref 26.0–34.0)
MCHC: 30.2 g/dL (ref 30.0–36.0)
MCV: 101.7 fL — ABNORMAL HIGH (ref 80.0–100.0)
Platelets: 121 10*3/uL — ABNORMAL LOW (ref 150–400)
RBC: 2.31 MIL/uL — ABNORMAL LOW (ref 3.87–5.11)
RDW: 17.4 % — ABNORMAL HIGH (ref 11.5–15.5)
WBC: 10.6 10*3/uL — ABNORMAL HIGH (ref 4.0–10.5)
nRBC: 0.7 % — ABNORMAL HIGH (ref 0.0–0.2)

## 2022-06-06 LAB — HEPATIC FUNCTION PANEL
ALT: 47 U/L — ABNORMAL HIGH (ref 0–44)
AST: 420 U/L — ABNORMAL HIGH (ref 15–41)
Albumin: 2.5 g/dL — ABNORMAL LOW (ref 3.5–5.0)
Alkaline Phosphatase: 96 U/L (ref 38–126)
Bilirubin, Direct: 0.2 mg/dL (ref 0.0–0.2)
Indirect Bilirubin: 0.3 mg/dL (ref 0.3–0.9)
Total Bilirubin: 0.5 mg/dL (ref 0.3–1.2)
Total Protein: 5.5 g/dL — ABNORMAL LOW (ref 6.5–8.1)

## 2022-06-06 LAB — RENAL FUNCTION PANEL
Albumin: 2.5 g/dL — ABNORMAL LOW (ref 3.5–5.0)
Anion gap: 14 (ref 5–15)
BUN: 43 mg/dL — ABNORMAL HIGH (ref 8–23)
CO2: 29 mmol/L (ref 22–32)
Calcium: 7.6 mg/dL — ABNORMAL LOW (ref 8.9–10.3)
Chloride: 99 mmol/L (ref 98–111)
Creatinine, Ser: 6.19 mg/dL — ABNORMAL HIGH (ref 0.44–1.00)
GFR, Estimated: 6 mL/min — ABNORMAL LOW (ref >60)
Glucose, Bld: 103 mg/dL — ABNORMAL HIGH (ref 70–99)
Phosphorus: 5.2 mg/dL — ABNORMAL HIGH (ref 2.5–4.6)
Potassium: 4.7 mmol/L (ref 3.5–5.1)
Sodium: 142 mmol/L (ref 135–145)

## 2022-06-06 LAB — ECHOCARDIOGRAM COMPLETE
AR max vel: 2.58 cm2
AV Peak grad: 7.7 mmHg
Ao pk vel: 1.39 m/s
Area-P 1/2: 2.81 cm2
Height: 62 in
S' Lateral: 3.25 cm
Weight: 2282.2 oz

## 2022-06-06 LAB — GLUCOSE, CAPILLARY
Glucose-Capillary: 107 mg/dL — ABNORMAL HIGH (ref 70–99)
Glucose-Capillary: 73 mg/dL (ref 70–99)
Glucose-Capillary: 96 mg/dL (ref 70–99)
Glucose-Capillary: 107 mg/dL — ABNORMAL HIGH (ref 70–99)
Glucose-Capillary: 118 mg/dL — ABNORMAL HIGH (ref 70–99)
Glucose-Capillary: 95 mg/dL (ref 70–99)

## 2022-06-06 LAB — URINE CULTURE: Culture: NO GROWTH

## 2022-06-06 LAB — HEPATITIS C ANTIBODY: HCV Ab: NONREACTIVE

## 2022-06-06 LAB — LACTIC ACID, PLASMA
Lactic Acid, Venous: 3.5 mmol/L (ref 0.5–1.9)
Lactic Acid, Venous: 2.7 mmol/L (ref 0.5–1.9)

## 2022-06-06 LAB — HEPATITIS B SURFACE ANTIGEN: Hepatitis B Surface Ag: NONREACTIVE

## 2022-06-06 LAB — HEPATITIS B SURFACE ANTIBODY,QUALITATIVE: Hep B S Ab: NONREACTIVE

## 2022-06-06 LAB — AMMONIA: Ammonia: 81 umol/L — ABNORMAL HIGH (ref 9–35)

## 2022-06-06 LAB — HEPATITIS B CORE ANTIBODY, TOTAL: Hep B Core Total Ab: NONREACTIVE

## 2022-06-06 MED ORDER — SODIUM BICARBONATE 8.4 % IV SOLN
100.0000 meq | INTRAVENOUS | Status: AC
  Administered 2022-06-06: 100 meq via INTRAVENOUS
  Filled 2022-06-06: qty 100

## 2022-06-06 MED ORDER — CHLORHEXIDINE GLUCONATE CLOTH 2 % EX PADS
6.0000 | MEDICATED_PAD | Freq: Every day | CUTANEOUS | Status: DC
  Administered 2022-06-07 – 2022-06-09 (×4): 6 via TOPICAL

## 2022-06-06 MED ORDER — HEPARIN SODIUM (PORCINE) 1000 UNIT/ML DIALYSIS
1500.0000 [IU] | INTRAMUSCULAR | Status: DC | PRN
  Filled 2022-06-06: qty 2

## 2022-06-06 MED ORDER — NALOXONE HCL 0.4 MG/ML IJ SOLN
0.4000 mg | Freq: Once | INTRAMUSCULAR | Status: AC
  Administered 2022-06-06: 0.4 mg via INTRAVENOUS

## 2022-06-06 MED ORDER — DEXTROSE 10 % IV SOLN: INTRAVENOUS | Status: AC

## 2022-06-06 MED ORDER — PERFLUTREN LIPID MICROSPHERE
1.0000 mL | INTRAVENOUS | Status: AC | PRN
  Administered 2022-06-06: 4 mL via INTRAVENOUS

## 2022-06-06 MED ORDER — DEXTROSE 50 % IV SOLN
12.5000 g | Freq: Once | INTRAVENOUS | Status: AC
  Administered 2022-06-06: 12.5 g via INTRAVENOUS
  Filled 2022-06-06: qty 50

## 2022-06-06 MED ORDER — DEXTROSE IN LACTATED RINGERS 5 % IV SOLN: INTRAVENOUS | Status: DC

## 2022-06-06 MED ORDER — PIPERACILLIN-TAZOBACTAM IN DEX 2-0.25 GM/50ML IV SOLN
2.2500 g | Freq: Three times a day (TID) | INTRAVENOUS | Status: DC
  Administered 2022-06-06 – 2022-06-11 (×15): 2.25 g via INTRAVENOUS
  Filled 2022-06-06 (×17): qty 50

## 2022-06-06 MED ORDER — HEPARIN SODIUM (PORCINE) 1000 UNIT/ML IJ SOLN
INTRAMUSCULAR | Status: AC
  Filled 2022-06-06: qty 6

## 2022-06-06 MED ORDER — NALOXONE HCL 0.4 MG/ML IJ SOLN
0.4000 mg | INTRAMUSCULAR | Status: DC | PRN
Start: 2022-06-06 — End: 2022-06-06
  Filled 2022-06-06: qty 1

## 2022-06-06 MED ORDER — SODIUM CHLORIDE 0.9 % IV SOLN: INTRAVENOUS | Status: DC | PRN

## 2022-06-06 NOTE — Progress Notes (Signed)
[facility] Progress Note  Subjective: remains confused, not interacting verbally. Had HD early this am due to worsening K+.  Repeat K+ post HD was 4.7.    Vitals:   06/06/22 0918 06/06/22 1000 06/06/22 1100 06/06/22 1200  BP:  99/70 (!) 126/49 116/79  Pulse:  70  81  Resp:  11 (!) 24 (!) 22  Temp:  97.9 F (36.6 C) 98.1 F (36.7 C) 98.6 F (37 C)  TempSrc:      SpO2:  99%  96%  Weight: 64.7 kg     Height:        Exam: Gen pt is lethargic, remains non-verbal at this time No jvd or bruits Chest clear bilat to bases RRR no MRG Abd soft ntnd obese, clean wounds from recent lap surgery Ext no LE or UE edema Neuro as above AVF +bruit    Home meds include -amlodipine 5, atorvastatin, colchicine, estradiol, famotidine, lisniopril 40 qd, renavite, proair hfa, sodium bicarbonate 2 bid, tramadol, prns/ vits/ supps      UA rbc >50, wbc > 50, 0-5 epis, prot 100    CT abd pelvis > new bibasilar consolidation R > L and new R > L pleural effusions, some underlying fibrotic changes as well    OP HD: TTS [facility] in [place], [place] - AVF - get records     Assessment/ Plan: Severe sepsis - likely asp PNA, alsop poss UTI. CT neg for intra-abd process. Got IVF's and IV abx on admission. LA down today. BCx neg, cont IV abx. Not requiring pressors.  AMS - due to sepsis, not much better after HD ESRD - usual HD on TTS. Had HD here this am. Next HD off schedule tomorrow.  Hyperkalemia - resolved Acute hypoxic resp failure / asp PNA/ bilat pleural effusions - per CCM PAF - new onset, per pmd MBD ckd - CCa and phos in range Anemia esrd - get records from [place].  DM - on insulin, per pmd SP recent lower abd surgery 8/18 - underwent lap hysterectomy, bilateral salpingo-oopherectomy, and sacrocolpopexy by urology    Kelly Splinter 06/06/2022, 2:18 PM   Recent Labs  Lab 06/05/22 2232 06/05/22 2328 06/06/22 0115 06/06/22 0451 06/06/22 0452  HGB 8.8*  --   --   7.1*  --   ALBUMIN  --   --   --  2.5* 2.5*  CALCIUM  --    < > 7.6*  --  7.6*  PHOS  --   --   --   --  5.2*  CREATININE  --    < > 6.26*  --  6.19*  K 5.1   < > 5.9*  --  4.7   < > = values in this interval not displayed.   No results for input(s): "IRON", "TIBC", "FERRITIN" in the last 168 hours. Inpatient medications:  Chlorhexidine Gluconate Cloth  6 each Topical Q0600   heparin  5,000 Units Subcutaneous Q8H   heparin sodium (porcine)        dextrose Stopped (06/06/22 1157)   piperacillin-tazobactam (ZOSYN)  IV 2.25 g (06/06/22 1216)   [START ON 06/07/2022] heparin, heparin sodium (porcine)

## 2022-06-06 NOTE — Progress Notes (Signed)
Denture have been located. Placed in dentures cup with pt label on it at bedside. Sister at bedside made aware.

## 2022-06-06 NOTE — H&P (Deleted)
NAME:  Krista Gordon, MRN:  [id number], DOB:  08-04-1941, LOS: 1 ADMISSION DATE:  06/05/2022, CONSULTATION DATE:  06/05/2022 REFERRING MD:  Dr. 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  Patient recently underwent robotic assisted laparoscopic hysterectomy, bilateral salpingo-oopherectomy, and sacrocolpopexy for pelvic organ prolapse on 8/18, discharged home on 8/19 with tramadol.  Family report she wasn't at her baseline when she was discharged, was confused, but they were trying to get her home for her scheduled dialysis on Saturday.  However, she was only able to tolerate one hour due to hypotension.  Since, family report ongoing weakness, lethargy, poor PO intake, with patient primarily sleeping since.  At baseline, family reports patient is fully dependent in her ADLs, drives, and still works as Teacher, English as a foreign language.   Found by EMS with SBP 79, CBG 64, room air saturation 79%, and responsive to verbal/ painful stimuli. O2 improved with 3L O2 and given glucagon.  IV access only able to be obtained in ER.  Was afebrile with improved SBP in the 90's.  Given 1.5L LR.  Labs noted for WBC 16.1, H/H 7.8/ 26.6 (10.1/ 35.7> 8.2/ 28.9 on discharge 8/19), plts 137, K 5.8, bicarb 18, BUN 42, sCr 6.8, alk phos 138, albumin 3.1, AST 126, INR 1.7, UA with large Hgb, RBC> 50, leukocytes, WBC> 50.  EKG noted afib, rate 90's (afib new for patient).  CXR noted for low lung volumes, stable cardiomegaly, chronic interstitial prominence attributed to underlying fibrosis.  CT head negative for acute intracranial abnormality.  CT abd/ pelvis noted new bilateral pleural effusions with bilateral lower lobe consolidation with evidence of underlying chronic fibrotic lung disease, and extensive soft tissue emphysema throughout the anterior abdominal wall with extension into  the perineum, likely related to recent surgery but no overt focal fluid collection or pneumoperitoneum.  Blood and urine cultures sent and started on empiric antibiotics.  PCCM called for admit.   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 rate 14, height 5\' 2"  (1.575 m), weight 64.7 kg, SpO2 94 %.        Intake/Output Summary (Last 24 hours) at 06/06/2022 0741 Last data filed at 06/06/2022 0500 Gross per 24 hour  Intake 3719.47 ml  Output 60 ml  Net 3659.47 ml    Filed Weights   06/05/22 1138 06/05/22 2000 06/06/22 0300  Weight: 56.2 kg 64.7 kg 64.7 kg    Examination: General:  Ill appearing elderly lady, NAD HEENT: dry mucous membranes, pupils pinpoint and non-reactive to light, anicteric, arcus senilis Neuro: lethargic but arouses, oriented to name.  Can follow commands, and squeeze hands CV: RRR, currently in NSR.  [facility] PULM:  non labored, not tachypneic, congested np cough, clear anteriorly, crackles in lung base bilaterally    GI: obese, soft, normal bowel sounds, port sites intact, approximated/ glued- wnl, has left groin wound with packing in place> no foul odor or drainage Extremities: warm/dry, no LE edema, AVF left forearm  Skin: no rashes   Labs/ Imaging  8/21 CTH> neg 8/21 CT a/p  Resolved Hospital Problem list    Assessment & Plan:   Severe sepsis, likely related to aspiration pneumonia, possible UTI and can not fully rule out intra-abdominal process but less likely. Bilateral pleural effusions with lower lob consolidation on CT A/P. S/p 1.5L in ER.  May need more fluids given very poor PO status since Friday.  Patient afebrile overnight, HDS w/  MAP > 65. Lactic acid downtrended to 2.7 overnight. Bcx NGTD, WBC 10.6 (16.1), 3.71 L intake, 60 cc out. 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  Is not on home O2. Patient on 3 L LFNC, with ABG pCO2 50, pH 7.355 overnight.  - high risk for intubation - sputum cx if intubated - NPO/ aspiration precautions - supplemental O2 prn for sat goal > 92% - abx as above - prn albuterol        ESRD on iHD TTS with hyperkalemia without EKG changes AGMA/ lactic acidosis  8/19 iHD for only 1hr due to hypotension. Anion gap closed, bicarb drip off. Phos 5.2, K 4.7. Patient to receive dialysis today.  - tele monitoring - Daily RFP - nephrology consulted, appreciate assistance  - trend renal indices/ strict I/Os   Acute metabolic/ septic encephalopathy CTH neg. AMS today, following commands, only oriented to self. Patient received HD today, making uremia less likely. Patient daughter noted patient has been AMS since surgery. Patient with pinpoint pupils and taking Tramadol 50 mg q6h prior to admission, could be accumulation causing AMS. Patient also with low CBG, considering subclinical seizures as well.  - Ammonia  - EEG  - CK - narcan - neuro checks q4h - supportive care as above - check ammonia    PAF- new onset  Afib POA, but resolved shortly after. CHA2DS2-VASc score 5. Patient in NSR today. Consider heparin gtt then bridge to DOAC if recurrence of A. Fib.  - tele monitor     Anemia of chronic disease + mild component of ABLA - baseline Hgb 9-10.  Hgb 10.1 pre-op down to 8.2 after surgery.  Today Hgb 7.1 without signs or symptoms of bleeding.   -Continue to  monitor/ trend.   -Transfuse for Hgb< 7   Mild Transaminitis  CT a/p noted chronic extrahepatic biliary dilatation post cholecystectomy, likely physiologic. Alk phos normal, AST elevated to 420 (126), ALT (19) elevated to 47. Etiology unknown will obtain RUQ [place]. -RUQ [place]   Coagulopathy INR 1.7 on admission. Will recheck today -F/u INR  Chronic thrombocytopenia No current evidence of bleeding. Plt 121 (137) today.  - Continue to monitor  - trend plts on CBC    Hx of DM, current A1c suggest no longer diabetic  Hypoglycemia, resolved, likely related to NPO/ sepsis. CBG range 70-131. - trend CBG q 4, - hypoglycemia protocol prn.    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 Louis Meckel 8/18. CT a/p with changes c/w post surgery.   - Trend clinical abd exams.  - urology aware of admit  Best Practice (right click and "Reselect all SmartList Selections" daily)   Diet/type: NPO DVT prophylaxis: prophylactic heparin  GI prophylaxis: PPI Lines: N/A Foley:  Yes, and it  is still needed Code Status:  full code Last date of multidisciplinary goals of care discussion - sisters updated at bedside 8/22  Labs   CBC: Recent Labs  Lab 06/02/22 0615 06/03/22 0530 06/05/22 1347 06/05/22 2232 06/06/22 0451  WBC 6.2  --  16.1*  --  10.6*  NEUTROABS 3.5  --  13.8*  --   --   HGB 10.1* 8.2* 7.8* 8.8* 7.1*  HCT 35.7* 28.9* 26.6* 26.0* 23.5*  MCV 104.7*  --  106.4*  --  101.7*  PLT 121*  --  137*  --  121*     Basic Metabolic Panel: Recent Labs  Lab 06/03/22 0530 06/05/22 1255 06/05/22 2232 06/05/22 2328 06/06/22 0115 06/06/22 0452  NA 138 139 139 143 139 142  K 5.2* 5.8* 5.1 7.2* 5.9* 4.7  CL 103 103  --  104 101 99  CO2 27 18*  --  21* 25 29  GLUCOSE 76 90  --  123* 131* 103*  BUN 22 42*  --  48* 45* 43*  CREATININE 4.59* 6.80*  --  6.51* 6.26* 6.19*  CALCIUM 8.0* 8.4*  --  8.6* 7.6* 7.6*  PHOS  --    --   --   --   --  5.2*    GFR: Estimated Creatinine Clearance: 6.5 mL/min (A) (by C-G formula based on SCr of 6.19 mg/dL (H)). Recent Labs  Lab 06/02/22 0615 06/05/22 1254 06/05/22 1347 06/05/22 1511 06/05/22 2328 06/06/22 0451  WBC 6.2  --  16.1*  --   --  10.6*  LATICACIDVEN  --  6.6*  --  3.5* 2.7*  --      Liver Function Tests: Recent Labs  Lab 06/02/22 0615 06/05/22 1255 06/06/22 0451 06/06/22 0452  AST 21 126* 420*  --   ALT 10 19 47*  --   ALKPHOS 82 138* 96  --   BILITOT 0.7 <0.1* 0.5  --   PROT 7.0 6.9 5.5*  --   ALBUMIN 3.2* 3.1* 2.5* 2.5*    No results for input(s): "LIPASE", "AMYLASE" in the last 168 hours. Recent Labs  Lab 06/05/22 2328  AMMONIA 81*    ABG    Component Value Date/Time   PHART 7.355 06/05/2022 2232   PCO2ART 50.0 (H) 06/05/2022 2232   PO2ART 78 (L) 06/05/2022 2232   HCO3 28.0 06/05/2022 2232   TCO2 30 06/05/2022 2232   ACIDBASEDEF 3.3 (H) 06/05/2022 1356   O2SAT 95 06/05/2022 2232     Coagulation Profile: Recent Labs  Lab 06/05/22 1255  INR 1.7*     Cardiac Enzymes: No results for input(s): "CKTOTAL", "CKMB", "CKMBINDEX", "TROPONINI" in the last 168 hours.  HbA1C: Hgb A1c MFr Bld  Date/Time Value Ref Range Status  06/02/2022 06:15 AM 4.0 (L) 4.8 - 5.6 % Final    Comment:    (NOTE) Pre diabetes:          5.7%-6.4%  Diabetes:              >6.4%  Glycemic control for   <7.0% adults with diabetes   10/17/2021 05:11 AM 4.3 (L) 4.8 - 5.6 % Final    Comment:    (NOTE) Pre diabetes:          5.7%-6.4%  Diabetes:              >6.4%  Glycemic control for   <7.0% adults with diabetes     CBG: Recent Labs  Lab 06/02/22 1203 06/05/22 1138  06/05/22 2027 06/05/22 2317 06/06/22 0323  GLUCAP 194* 70 119* 96 107*     Review of Systems:   Unable given encephalopathy   Past Medical History:  She,  has a past medical history of Anemia, COVID, Diabetes mellitus without complication ([place]), ESRD on  hemodialysis ([place]), Gall stone, Gout, Hyperlipidemia, and Hypertension.   Surgical History:   Past Surgical History:  Procedure Laterality Date   A/V FISTULAGRAM Left 12/05/2021   Procedure: A/V Fistulagram;  Surgeon: Algernon Huxley, MD;  Location: [place] [facility];  Service: Cardiovascular;  Laterality: Left;   A/V SHUNTOGRAM Left 02/16/2020   Procedure: A/V SHUNTOGRAM;  Surgeon: Algernon Huxley, MD;  Location: [place];  Service: Cardiovascular;  Laterality: Left;   AV FISTULA PLACEMENT Left 01/15/2020   Procedure: INSERTION OF ARTERIOVENOUS (AV) GORE-TEX GRAFT ARM;  Surgeon: Algernon Huxley, MD;  Location: [facility];  Service: Vascular;  Laterality: Left;   COLONOSCOPY WITH PROPOFOL N/A 01/27/2016   Procedure: COLONOSCOPY WITH PROPOFOL;  Surgeon: Hulen Luster, MD;  [facility];  Service: Gastroenterology;  Laterality: N/A;   ROBOTIC ASSISTED LAPAROSCOPIC SACROCOLPOPEXY Bilateral 06/02/2022   Procedure: XI ROBOTIC ASSISTED LAPAROSCOPIC SACROCOLPOPEXY SUPRACEVICAL HYSTERECTOMY AND BILATERAL SALPINGO OOPHERECTOMY;  Surgeon: Ardis Hughs, MD;  Location: [facility];  Service: Urology;  Laterality: Bilateral;  4.5 HRS FOR THIS CASE   TUBAL LIGATION       Social History:   reports that she has quit smoking. She has never used smokeless tobacco. She reports that she does not drink alcohol and does not use drugs.   Family History:  Her family history includes Breast cancer (age of onset: 59) in her sister.   Allergies Allergies  Allergen Reactions   Mircera [Methoxy Polyethylene Glycol-Epoetin Beta]     Patient doesn't recall this allergy   Tramadol Itching     Home Medications  Prior to Admission medications   Medication Sig Start Date End Date Taking? Authorizing Provider  amLODipine (NORVASC) 5 MG tablet Take 5 mg by mouth daily. 01/02/22   [provider]  atorvastatin (LIPITOR) 40 MG tablet Take 40 mg by mouth at bedtime.     [provider]   cephALEXin (KEFLEX) 500 MG capsule Take 1 capsule (500 mg total) by mouth at bedtime for 5 days. 06/03/22 06/08/22  Cleon Gustin, MD  colchicine 0.6 MG tablet Take 0.6 mg by mouth every other day.    [provider]  estradiol (ESTRACE VAGINAL) 0.1 MG/GM vaginal cream Place 1 Applicatorful vaginally 3 (three) times a week. Use 1 small dolyp of cream on tip of index finger and swap the inside of the vagina 06/02/22   Ardis Hughs, MD  famotidine (PEPCID) 10 MG tablet Take 1 tablet (10 mg total) by mouth daily. Patient not taking: Reported on 05/15/2022 10/20/21 12/05/21  Kayleen Memos, DO  ferrous sulfate 325 (65 FE) MG tablet Take 325 mg by mouth daily.     [provider]  lisinopril (ZESTRIL) 40 MG tablet Take 40 mg by mouth daily.    [provider]  loratadine (CLARITIN) 10 MG tablet Take 10 mg by mouth every other day. Patient not taking: Reported on 05/15/2022 01/02/22   [provider]  multivitamin (RENA-VIT) TABS tablet Take 1 tablet by mouth at bedtime. Patient not taking: Reported on 05/15/2022 10/20/21   Kayleen Memos, DO  mupirocin ointment (BACTROBAN) 2 % Apply topically 2 (two) times daily. Patient not taking: Reported  on 05/15/2022 10/20/21   Kayleen Memos, DO  PROAIR HFA 108 [phone] Base) MCG/ACT inhaler Inhale 2 puffs into the lungs every 6 (six) hours as needed for shortness of breath. 06/15/18   [provider]  sodium bicarbonate 650 MG tablet Take 1,300 mg by mouth 2 (two) times daily.    [provider]  traMADol (ULTRAM) 50 MG tablet Take 1-2 tablets (50-100 mg total) by mouth every 6 (six) hours as needed for moderate pain or severe pain. 06/02/22   Debbrah Alar, PA-C     Critical care time: 63 mins       Holley Bouche, MD PGY-2 [facility] 06/06/2022, 7:41 AM  See Shea Evans for pager If no response to pager, please call PCCM consult pager After 7:00 pm call Elink

## 2022-06-06 NOTE — Progress Notes (Signed)
NAME:  Krista Gordon, MRN:  [id number], DOB:  1940-12-10, LOS: 1 ADMISSION DATE:  06/05/2022, CONSULTATION DATE:  06/05/2022 REFERRING MD:  Dr. 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  Patient recently underwent robotic assisted laparoscopic hysterectomy, bilateral salpingo-oopherectomy, and sacrocolpopexy for pelvic organ prolapse on 8/18, discharged home on 8/19 with tramadol.  Family report she wasn't at her baseline when she was discharged, was confused, but they were trying to get her home for her scheduled dialysis on Saturday.  However, she was only able to tolerate one hour due to hypotension.  Since, family report ongoing weakness, lethargy, poor PO intake, with patient primarily sleeping since.  At baseline, family reports patient is fully dependent in her ADLs, drives, and still works as Teacher, English as a foreign language.   Found by EMS with SBP 79, CBG 64, room air saturation 79%, and responsive to verbal/ painful stimuli. O2 improved with 3L O2 and given glucagon.  IV access only able to be obtained in ER.  Was afebrile with improved SBP in the 90's.  Given 1.5L LR.  Labs noted for WBC 16.1, H/H 7.8/ 26.6 (10.1/ 35.7> 8.2/ 28.9 on discharge 8/19), plts 137, K 5.8, bicarb 18, BUN 42, sCr 6.8, alk phos 138, albumin 3.1, AST 126, INR 1.7, UA with large Hgb, RBC> 50, leukocytes, WBC> 50.  EKG noted afib, rate 90's (afib new for patient).  CXR noted for low lung volumes, stable cardiomegaly, chronic interstitial prominence attributed to underlying fibrosis.  CT head negative for acute intracranial abnormality.  CT abd/ pelvis noted new bilateral pleural effusions with bilateral lower lobe consolidation with evidence of underlying chronic fibrotic lung disease, and extensive soft tissue emphysema throughout the anterior abdominal wall with extension into  the perineum, likely related to recent surgery but no overt focal fluid collection or pneumoperitoneum.  Blood and urine cultures sent and started on empiric antibiotics.  PCCM called for admit.   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 rate 14, height 5\' 2"  (1.575 m), weight 64.7 kg, SpO2 94 %.        Intake/Output Summary (Last 24 hours) at 06/06/2022 0741 Last data filed at 06/06/2022 0500 Gross per 24 hour  Intake 3719.47 ml  Output 60 ml  Net 3659.47 ml    Filed Weights   06/05/22 1138 06/05/22 2000 06/06/22 0300  Weight: 56.2 kg 64.7 kg 64.7 kg    Examination: General:  Ill appearing elderly lady, NAD HEENT: dry mucous membranes, pupils pinpoint and non-reactive to light, anicteric, arcus senilis Neuro: lethargic but arouses, oriented to name.  Can follow commands, and squeeze hands CV: RRR, currently in NSR.  [facility] PULM:  non labored, not tachypneic, congested np cough, clear anteriorly, crackles in lung base bilaterally    GI: obese, soft, normal bowel sounds, port sites intact, approximated/ glued- wnl, has left groin wound with packing in place> no foul odor or drainage Extremities: warm/dry, no LE edema, AVF left forearm  Skin: no rashes   Labs/ Imaging  8/21 CTH> neg 8/21 CT a/p  Resolved Hospital Problem list    Assessment & Plan:   Severe sepsis, likely related to aspiration pneumonia, possible UTI and can not fully rule out intra-abdominal process but less likely. Bilateral pleural effusions with lower lob consolidation on CT A/P. S/p 1.5L in ER.  May need more fluids given very poor PO status since Friday.  Patient afebrile overnight, HDS w/  MAP > 65. Lactic acid downtrended to 2.7 overnight. Bcx NGTD, WBC 10.6 (16.1), 3.71 L intake, 60 cc out. 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  Is not on home O2. Patient on 3 L LFNC, with ABG pCO2 50, pH 7.355 overnight.  - high risk for intubation - sputum cx if intubated - NPO/ aspiration precautions - supplemental O2 prn for sat goal > 92% - abx as above - prn albuterol        ESRD on iHD TTS with hyperkalemia without EKG changes AGMA/ lactic acidosis  8/19 iHD for only 1hr due to hypotension. Anion gap closed, bicarb drip off. Phos 5.2, K 4.7. Patient to receive dialysis today.  - tele monitoring - Daily RFP - nephrology consulted, appreciate assistance  - trend renal indices/ strict I/Os   Acute metabolic/ septic encephalopathy CTH neg. AMS today, following commands, only oriented to self. Patient received HD today, making uremia less likely. Patient daughter noted patient has been AMS since surgery. Patient with pinpoint pupils and taking Tramadol 50 mg q6h prior to admission, could be accumulation causing AMS. Patient also with low CBG, considering subclinical seizures as well.  - Ammonia  - EEG  - CK - narcan - neuro checks q4h - supportive care as above - check ammonia    PAF- new onset  Afib POA, but resolved shortly after. CHA2DS2-VASc score 5. Patient in NSR today. Consider heparin gtt then bridge to DOAC if recurrence of A. Fib.  - tele monitor     Anemia of chronic disease + mild component of ABLA - baseline Hgb 9-10.  Hgb 10.1 pre-op down to 8.2 after surgery.  Today Hgb 7.1 without signs or symptoms of bleeding.   -Continue to  monitor/ trend.   -Transfuse for Hgb< 7   Mild Transaminitis  CT a/p noted chronic extrahepatic biliary dilatation post cholecystectomy, likely physiologic. Alk phos normal, AST elevated to 420 (126), ALT (19) elevated to 47. Etiology unknown will obtain RUQ [place]. -RUQ [place]   Coagulopathy INR 1.7 on admission. Will recheck today -F/u INR  Chronic thrombocytopenia No current evidence of bleeding. Plt 121 (137) today.  - Continue to monitor  - trend plts on CBC    Hx of DM, current A1c suggest no longer diabetic  Hypoglycemia, resolved, likely related to NPO/ sepsis. CBG range 70-131. - trend CBG q 4, - hypoglycemia protocol prn.    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 Louis Meckel 8/18. CT a/p with changes c/w post surgery.   - Trend clinical abd exams.  - urology aware of admit  Best Practice (right click and "Reselect all SmartList Selections" daily)   Diet/type: NPO DVT prophylaxis: prophylactic heparin  GI prophylaxis: PPI Lines: N/A Foley:  Yes, and it  is still needed Code Status:  full code Last date of multidisciplinary goals of care discussion - sisters updated at bedside 8/22  Labs   CBC: Recent Labs  Lab 06/02/22 0615 06/03/22 0530 06/05/22 1347 06/05/22 2232 06/06/22 0451  WBC 6.2  --  16.1*  --  10.6*  NEUTROABS 3.5  --  13.8*  --   --   HGB 10.1* 8.2* 7.8* 8.8* 7.1*  HCT 35.7* 28.9* 26.6* 26.0* 23.5*  MCV 104.7*  --  106.4*  --  101.7*  PLT 121*  --  137*  --  121*     Basic Metabolic Panel: Recent Labs  Lab 06/03/22 0530 06/05/22 1255 06/05/22 2232 06/05/22 2328 06/06/22 0115 06/06/22 0452  NA 138 139 139 143 139 142  K 5.2* 5.8* 5.1 7.2* 5.9* 4.7  CL 103 103  --  104 101 99  CO2 27 18*  --  21* 25 29  GLUCOSE 76 90  --  123* 131* 103*  BUN 22 42*  --  48* 45* 43*  CREATININE 4.59* 6.80*  --  6.51* 6.26* 6.19*  CALCIUM 8.0* 8.4*  --  8.6* 7.6* 7.6*  PHOS  --    --   --   --   --  5.2*    GFR: Estimated Creatinine Clearance: 6.5 mL/min (A) (by C-G formula based on SCr of 6.19 mg/dL (H)). Recent Labs  Lab 06/02/22 0615 06/05/22 1254 06/05/22 1347 06/05/22 1511 06/05/22 2328 06/06/22 0451  WBC 6.2  --  16.1*  --   --  10.6*  LATICACIDVEN  --  6.6*  --  3.5* 2.7*  --      Liver Function Tests: Recent Labs  Lab 06/02/22 0615 06/05/22 1255 06/06/22 0451 06/06/22 0452  AST 21 126* 420*  --   ALT 10 19 47*  --   ALKPHOS 82 138* 96  --   BILITOT 0.7 <0.1* 0.5  --   PROT 7.0 6.9 5.5*  --   ALBUMIN 3.2* 3.1* 2.5* 2.5*    No results for input(s): "LIPASE", "AMYLASE" in the last 168 hours. Recent Labs  Lab 06/05/22 2328  AMMONIA 81*    ABG    Component Value Date/Time   PHART 7.355 06/05/2022 2232   PCO2ART 50.0 (H) 06/05/2022 2232   PO2ART 78 (L) 06/05/2022 2232   HCO3 28.0 06/05/2022 2232   TCO2 30 06/05/2022 2232   ACIDBASEDEF 3.3 (H) 06/05/2022 1356   O2SAT 95 06/05/2022 2232     Coagulation Profile: Recent Labs  Lab 06/05/22 1255  INR 1.7*     Cardiac Enzymes: No results for input(s): "CKTOTAL", "CKMB", "CKMBINDEX", "TROPONINI" in the last 168 hours.  HbA1C: Hgb A1c MFr Bld  Date/Time Value Ref Range Status  06/02/2022 06:15 AM 4.0 (L) 4.8 - 5.6 % Final    Comment:    (NOTE) Pre diabetes:          5.7%-6.4%  Diabetes:              >6.4%  Glycemic control for   <7.0% adults with diabetes   10/17/2021 05:11 AM 4.3 (L) 4.8 - 5.6 % Final    Comment:    (NOTE) Pre diabetes:          5.7%-6.4%  Diabetes:              >6.4%  Glycemic control for   <7.0% adults with diabetes     CBG: Recent Labs  Lab 06/02/22 1203 06/05/22 1138  06/05/22 2027 06/05/22 2317 06/06/22 0323  GLUCAP 194* 70 119* 96 107*     Review of Systems:   Unable given encephalopathy   Past Medical History:  She,  has a past medical history of Anemia, COVID, Diabetes mellitus without complication ([place]), ESRD on  hemodialysis ([place]), Gall stone, Gout, Hyperlipidemia, and Hypertension.   Surgical History:   Past Surgical History:  Procedure Laterality Date   A/V FISTULAGRAM Left 12/05/2021   Procedure: A/V Fistulagram;  Surgeon: Algernon Huxley, MD;  Location: [facility];  Service: Cardiovascular;  Laterality: Left;   A/V SHUNTOGRAM Left 02/16/2020   Procedure: A/V SHUNTOGRAM;  Surgeon: Algernon Huxley, MD;  Location: [facility];  Service: Cardiovascular;  Laterality: Left;   AV FISTULA PLACEMENT Left 01/15/2020   Procedure: INSERTION OF ARTERIOVENOUS (AV) GORE-TEX GRAFT ARM;  Surgeon: Algernon Huxley, MD;  Location: [facility];  Service: Vascular;  Laterality: Left;   COLONOSCOPY WITH PROPOFOL N/A 01/27/2016   Procedure: COLONOSCOPY WITH PROPOFOL;  Surgeon: Hulen Luster, MD;  Location: [facility];  Service: Gastroenterology;  Laterality: N/A;   ROBOTIC ASSISTED LAPAROSCOPIC SACROCOLPOPEXY Bilateral 06/02/2022   Procedure: XI ROBOTIC ASSISTED LAPAROSCOPIC SACROCOLPOPEXY SUPRACEVICAL HYSTERECTOMY AND BILATERAL SALPINGO OOPHERECTOMY;  Surgeon: Ardis Hughs, MD;  Location: [facility];  Service: Urology;  Laterality: Bilateral;  4.5 HRS FOR THIS CASE   TUBAL LIGATION       Social History:   reports that she has quit smoking. She has never used smokeless tobacco. She reports that she does not drink alcohol and does not use drugs.   Family History:  Her family history includes Breast cancer (age of onset: 51) in her sister.   Allergies Allergies  Allergen Reactions   Mircera [Methoxy Polyethylene Glycol-Epoetin Beta]     Patient doesn't recall this allergy   Tramadol Itching     Home Medications  Prior to Admission medications   Medication Sig Start Date End Date Taking? Authorizing Provider  amLODipine (NORVASC) 5 MG tablet Take 5 mg by mouth daily. 01/02/22   [provider]  atorvastatin (LIPITOR) 40 MG tablet Take 40 mg by mouth at bedtime.     [provider]   cephALEXin (KEFLEX) 500 MG capsule Take 1 capsule (500 mg total) by mouth at bedtime for 5 days. 06/03/22 06/08/22  Cleon Gustin, MD  colchicine 0.6 MG tablet Take 0.6 mg by mouth every other day.    [provider]  estradiol (ESTRACE VAGINAL) 0.1 MG/GM vaginal cream Place 1 Applicatorful vaginally 3 (three) times a week. Use 1 small dolyp of cream on tip of index finger and swap the inside of the vagina 06/02/22   Ardis Hughs, MD  famotidine (PEPCID) 10 MG tablet Take 1 tablet (10 mg total) by mouth daily. Patient not taking: Reported on 05/15/2022 10/20/21 12/05/21  Kayleen Memos, DO  ferrous sulfate 325 (65 FE) MG tablet Take 325 mg by mouth daily.     [provider]  lisinopril (ZESTRIL) 40 MG tablet Take 40 mg by mouth daily.    [provider]  loratadine (CLARITIN) 10 MG tablet Take 10 mg by mouth every other day. Patient not taking: Reported on 05/15/2022 01/02/22   [provider]  multivitamin (RENA-VIT) TABS tablet Take 1 tablet by mouth at bedtime. Patient not taking: Reported on 05/15/2022 10/20/21   Kayleen Memos, DO  mupirocin ointment (BACTROBAN) 2 % Apply topically 2 (two) times daily. Patient not taking: Reported  on 05/15/2022 10/20/21   Kayleen Memos, DO  PROAIR HFA 108 [phone] Base) MCG/ACT inhaler Inhale 2 puffs into the lungs every 6 (six) hours as needed for shortness of breath. 06/15/18   [provider]  sodium bicarbonate 650 MG tablet Take 1,300 mg by mouth 2 (two) times daily.    [provider]  traMADol (ULTRAM) 50 MG tablet Take 1-2 tablets (50-100 mg total) by mouth every 6 (six) hours as needed for moderate pain or severe pain. 06/02/22   Debbrah Alar, PA-C     Critical care time: 36 mins       Holley Bouche, MD PGY-2 [facility] 06/06/2022, 7:41 AM  See Shea Evans for pager If no response to pager, please call PCCM consult pager After 7:00 pm call Elink

## 2022-06-06 NOTE — Consult Note (Signed)
I came by and checked on the patient and the family this afternoon.  The patient is definitely more interactive, but very somnolent still.  She has no complaints of pain this afternoon  She remains afebrile hemodynamically stable Her abdomen is soft and nontender, her incisions are clean/dry/intact  The patient's hemoglobin has dropped to 7.1 but her white blood cell count has returned to normal.  Her urine and blood cultures are all negative  Impression: The patient is definitely more interactive.  I wonder if she is not encephalopathic due to the amount of anesthesia in combination with some of the pain medicine that she was given in the hospital and at home in conjunction with her left lower lobe pneumonia.  I am open that she will continue to improve and her encephalopathy resolve over the coming days.  Appreciate everyone's help in managing this very complex and confusing patient.  I have spoken with the patient's family and updated them on her most recent results and progress.  We will continue to follow, but she does not need any additional urologic intervention at this time.

## 2022-06-06 NOTE — ED Notes (Signed)
Writer who is Industrial/product designer has reached out to the nurse that triage and cared for patient when she arrived to our [place], Therapist, sports. She reports that the patient did not have any dentures on her arrival to the ED. Patient family did ask her about them, she reported to them as well that the patient arrived with no dentures. Unknown if EMS removed them or if they were not in at all and are located at patient's home somewhere.

## 2022-06-06 NOTE — Plan of Care (Signed)

## 2022-06-06 NOTE — Progress Notes (Signed)
EEG complete - results pending 

## 2022-06-06 NOTE — Progress Notes (Signed)
Date and time results received: 06/06/22 0040 (use smartphrase ".now" to insert current time)  Test: potassium  Critical Value: 7.2 possible hemolysis??      Name of Provider Notified: Dr. Loanne Drilling and Dr Royce Macadamia  Orders Received? Or Actions Taken?: awaiting orders

## 2022-06-06 NOTE — Consult Note (Addendum)
[place] Nurse Consult Note: Reason for Consult: Consult requested for abd wound.  Assessed abd and the patient has healing post-op laparoscopic sites from previous hysterectomy performed on 8/18. No topical treatment is indicated.   However, patient has a full thickness lesion of unknown etiology to left labia/perineum; 2X1X.3cm, 50% red, 50% yellow, raised above skin level and atypical in appearance. No drainage or fluctuance. Critical care physician at the bedside to assess the site and discuss plan of care, and a family member is present, she states she was not aware of this location. Pt is critically ill in ICU and unable to discuss how long the affected area has been present.   Dressing procedure/placement/frequency: Pt could benefit from a biopsy to determine is the wound is a possible cancerous lesion after discharge; there is no inpatient dermatologist available in the [place]. Topical treatment orders provided for bedside nurses to perform as follows to protect from further injury; Pack left perineum/vagina area with moist 2X2,using swab to fill, then cover with foam dressing. (Change foam Q 3 days or PRN soiling.) Please re-consult if further assistance is needed.  Thank-you,  Julien Girt MSN, [place], [place], [place], [place]

## 2022-06-06 NOTE — Progress Notes (Signed)
Received patient in bed to unit.  Alert and oriented.  Informed consent signed and in chart.   Treatment initiated: 0425 Treatment completed: 0822  Patient tolerated well.  Transported back to the room  Alert, without acute distress.  Hand-off given to patient's nurse.   Access used: Graft Access issues: None  Total UF removed: 0 Medication(s) given: None Post HD VS: see chart Post HD weight: 64.7 kg   [facility]

## 2022-06-06 NOTE — Progress Notes (Signed)
Pt receives out-pt HD at [facility] on TTS. Pt arrives at 6:00 for 6:20 chair time. Will assist as needed.   Melven Sartorius Renal Navigator [phone]

## 2022-06-06 NOTE — Procedures (Signed)
Patient Name: Krista Gordon  MRN: [id number]  Epilepsy Attending: Lora Havens  Referring Physician/Provider: Holley Bouche, MD  Date: 06/06/2022 Duration: 22.53 mins  Patient history: 81 year old female with altered mental status.  EEG to evaluate  for seizure.  Level of alertness: Awake  AEDs during EEG study: None  Technical aspects: This EEG study was done with scalp electrodes positioned according to the 10-20 International system of electrode placement. Electrical activity was reviewed with band pass filter of 1-'70Hz'$ , sensitivity of 7 uV/mm, display speed of 68m/sec with a '60Hz'$  notched filter applied as appropriate. EEG data were recorded continuously and digitally stored.  Video monitoring was available and reviewed as appropriate.  Description: No clear posterior dominant rhythm was seen. EEG showed continuous generalized 3 to 6 Hz theta-delta slowing. Hyperventilation and photic stimulation were not performed.     ABNORMALITY - Continuous slow, generalized  IMPRESSION: This study is suggestive of moderate diffuse encephalopathy, nonspecific etiology. No seizures or epileptiform discharges were seen throughout the recording.  Niani Mourer OBarbra Sarks

## 2022-06-07 ENCOUNTER — Other Ambulatory Visit (HOSPITAL_COMMUNITY): Payer: Medicare Other

## 2022-06-07 ENCOUNTER — Inpatient Hospital Stay (HOSPITAL_COMMUNITY): Payer: Medicare Other

## 2022-06-07 DIAGNOSIS — A4102 Sepsis due to Methicillin resistant Staphylococcus aureus: Secondary | ICD-10-CM | POA: Diagnosis not present

## 2022-06-07 DIAGNOSIS — R6520 Severe sepsis without septic shock: Secondary | ICD-10-CM | POA: Diagnosis not present

## 2022-06-07 DIAGNOSIS — N171 Acute kidney failure with acute cortical necrosis: Secondary | ICD-10-CM | POA: Diagnosis not present

## 2022-06-07 LAB — HEPATIC FUNCTION PANEL
ALT: 90 U/L — ABNORMAL HIGH (ref 0–44)
AST: 665 U/L — ABNORMAL HIGH (ref 15–41)
Albumin: 2.5 g/dL — ABNORMAL LOW (ref 3.5–5.0)
Alkaline Phosphatase: 88 U/L (ref 38–126)
Bilirubin, Direct: 0.3 mg/dL — ABNORMAL HIGH (ref 0.0–0.2)
Indirect Bilirubin: 0.6 mg/dL (ref 0.3–0.9)
Total Bilirubin: 0.9 mg/dL (ref 0.3–1.2)
Total Protein: 5.7 g/dL — ABNORMAL LOW (ref 6.5–8.1)

## 2022-06-07 LAB — RENAL FUNCTION PANEL
Albumin: 2.4 g/dL — ABNORMAL LOW (ref 3.5–5.0)
Anion gap: 9 (ref 5–15)
BUN: 18 mg/dL (ref 8–23)
CO2: 29 mmol/L (ref 22–32)
Calcium: 7.9 mg/dL — ABNORMAL LOW (ref 8.9–10.3)
Chloride: 96 mmol/L — ABNORMAL LOW (ref 98–111)
Creatinine, Ser: 3.69 mg/dL — ABNORMAL HIGH (ref 0.44–1.00)
GFR, Estimated: 12 mL/min — ABNORMAL LOW (ref >60)
Glucose, Bld: 91 mg/dL (ref 70–99)
Phosphorus: 3.7 mg/dL (ref 2.5–4.6)
Potassium: 3.7 mmol/L (ref 3.5–5.1)
Sodium: 134 mmol/L — ABNORMAL LOW (ref 135–145)

## 2022-06-07 LAB — GLUCOSE, CAPILLARY
Glucose-Capillary: 104 mg/dL — ABNORMAL HIGH (ref 70–99)
Glucose-Capillary: 102 mg/dL — ABNORMAL HIGH (ref 70–99)
Glucose-Capillary: 101 mg/dL — ABNORMAL HIGH (ref 70–99)
Glucose-Capillary: 92 mg/dL (ref 70–99)
Glucose-Capillary: 82 mg/dL (ref 70–99)
Glucose-Capillary: 96 mg/dL (ref 70–99)

## 2022-06-07 LAB — AMMONIA: Ammonia: 33 umol/L (ref 9–35)

## 2022-06-07 LAB — BODY FLUID CELL COUNT WITH DIFFERENTIAL
Lymphs, Fluid: 10 %
Monocyte-Macrophage-Serous Fluid: 24 % — ABNORMAL LOW (ref 50–90)
Neutrophil Count, Fluid: 66 % — ABNORMAL HIGH (ref 0–25)
Total Nucleated Cell Count, Fluid: 625 cu mm (ref 0–1000)

## 2022-06-07 LAB — LACTATE DEHYDROGENASE, PLEURAL OR PERITONEAL FLUID: LD, Fluid: 137 U/L — ABNORMAL HIGH (ref 3–23)

## 2022-06-07 LAB — CBC
HCT: 26.8 % — ABNORMAL LOW (ref 36.0–46.0)
Hemoglobin: 8.1 g/dL — ABNORMAL LOW (ref 12.0–15.0)
MCH: 30.7 pg (ref 26.0–34.0)
MCHC: 30.2 g/dL (ref 30.0–36.0)
MCV: 101.5 fL — ABNORMAL HIGH (ref 80.0–100.0)
Platelets: 106 10*3/uL — ABNORMAL LOW (ref 150–400)
RBC: 2.64 MIL/uL — ABNORMAL LOW (ref 3.87–5.11)
RDW: 17.8 % — ABNORMAL HIGH (ref 11.5–15.5)
WBC: 8.3 10*3/uL (ref 4.0–10.5)
nRBC: 1 % — ABNORMAL HIGH (ref 0.0–0.2)

## 2022-06-07 LAB — PROTIME-INR
INR: 1.5 — ABNORMAL HIGH (ref 0.8–1.2)
Prothrombin Time: 17.7 seconds — ABNORMAL HIGH (ref 11.4–15.2)

## 2022-06-07 LAB — PROTEIN, PLEURAL OR PERITONEAL FLUID: Total protein, fluid: <3 g/dL

## 2022-06-07 LAB — HEPATITIS B SURFACE ANTIBODY, QUANTITATIVE: Hep B S AB Quant (Post): <3.1 m[IU]/mL — ABNORMAL LOW (ref >9.9)

## 2022-06-07 LAB — CK: Total CK: 198 U/L (ref 38–234)

## 2022-06-07 MED ORDER — AMLODIPINE BESYLATE 5 MG PO TABS
5.0000 mg | ORAL_TABLET | Freq: Every day | ORAL | Status: DC
  Administered 2022-06-07 – 2022-06-08 (×2): 5 mg via ORAL
  Filled 2022-06-07 (×2): qty 1

## 2022-06-07 MED ORDER — ACETAMINOPHEN 10 MG/ML IV SOLN
500.0000 mg | Freq: Three times a day (TID) | INTRAVENOUS | Status: DC | PRN
Start: 2022-06-07 — End: 2022-06-07

## 2022-06-07 MED ORDER — LISINOPRIL 20 MG PO TABS
20.0000 mg | ORAL_TABLET | Freq: Every day | ORAL | Status: DC
  Administered 2022-06-07 – 2022-06-08 (×2): 20 mg via ORAL
  Filled 2022-06-07 (×2): qty 1

## 2022-06-07 MED ORDER — ACETAMINOPHEN 500 MG PO TABS
500.0000 mg | ORAL_TABLET | Freq: Three times a day (TID) | ORAL | Status: DC | PRN
Start: 2022-06-07 — End: 2022-06-11

## 2022-06-07 MED ORDER — HEPARIN SODIUM (PORCINE) 1000 UNIT/ML DIALYSIS
2000.0000 [IU] | INTRAMUSCULAR | Status: DC | PRN
Start: 2022-06-08 — End: 2022-06-09
  Filled 2022-06-07: qty 2

## 2022-06-07 MED ORDER — DARBEPOETIN ALFA 60 MCG/0.3ML IJ SOSY
60.0000 ug | PREFILLED_SYRINGE | INTRAMUSCULAR | Status: DC
Start: 2022-06-13 — End: 2022-06-11

## 2022-06-07 MED ORDER — DARBEPOETIN ALFA 60 MCG/0.3ML IJ SOSY
60.0000 ug | PREFILLED_SYRINGE | Freq: Once | INTRAMUSCULAR | Status: AC
  Administered 2022-06-07: 60 ug via INTRAVENOUS
  Filled 2022-06-07: qty 0.3

## 2022-06-07 MED ORDER — WHITE PETROLATUM EX OINT
TOPICAL_OINTMENT | CUTANEOUS | Status: DC | PRN
  Filled 2022-06-07: qty 28.35

## 2022-06-07 NOTE — Plan of Care (Signed)

## 2022-06-07 NOTE — Progress Notes (Signed)
Post HD Note  Received patient in bed, alert and oriented. Informed consent signed and in chart.    Time tx initiated: 0834 Time tx completed: 1242  HD treatment completed. Patient tolerated well. Fistula/Graft/HD catheter without signs and symptoms of complications. Patient remain in the room, alert and orient  to self and in no acute distress. Report given to bedside RN.   Total UF removed: 1800 Medication given: heparin Post HD VS: 97.8, 70, 100%, 10R 137/64 Post HD weight: 54kg   Luciel Brickman HassellPost HD Note  Received patient in bed, alert and oriented. Informed consent signed and in chart.    Time tx initiated:1520 Time tx completed: 1830  HD treatment completed. Patient tolerated well. Fistula/Graft/HD catheter without signs and symptoms of complications. Patient remain in room, alert and orient and in no acute distress. Report given to bedside RN.   Total UF removed: 0 Medication given: aranesp Post HD VS: 79,100%, 18r, 124/51 Post HD weight: 65.9kg   Christianne Dolin Dialysis Nurse   Dialysis Nurse

## 2022-06-07 NOTE — Progress Notes (Signed)
[facility] Progress Note  Subjective: mentating much better. BP's stable. K+ 3.7, BUN 18.   Vitals:   06/07/22 0900 06/07/22 1000 06/07/22 1155 06/07/22 1200  BP: (!) 162/71 (!) 152/65 (!) 148/71   Pulse: 71 71    Resp: 18 (!) 21    Temp:    97.6 F (36.4 C)  TempSrc:    Oral  SpO2: 97% 93%    Weight:      Height:        Exam: Gen alert, no distress No jvd or bruits Chest clear bilat to bases RRR no MRG Abd soft ntnd obese, clean wounds from recent lap surgery Ext no LE or UE edema Neuro as above AVF +bruit    Home meds include -amlodipine 5, atorvastatin, colchicine, estradiol, famotidine, lisniopril 40 qd, renavite, proair hfa, sodium bicarbonate 2 bid, tramadol, prns/ vits/ supps      UA rbc >50, wbc > 50, 0-5 epis, prot 100    CT abd pelvis > new bibasilar consolidation R > L and new R > L pleural effusions, some underlying fibrotic changes as well    OP HD: TTS [facility] in [place], [facility]  3h 92mn  59kg 350 bfr  Heparin 600u bolus  + 400u /hr - gets mircera 50 ug q 4 wks, not sure last but was not last week     Assessment/ Plan: Severe sepsis - likely asp PNA, alsop poss UTI. CT neg for intra-abd process. BCx neg, cont IV abx. Pt is improving.  AMS - better today ESRD - usual HD on TTS. Had HD here overnight Monday night. HD off schedule today, then next HD likely on Saturday.  Acute hypoxic resp failure / asp PNA/ bilat pleural effusions - per CCM HTN/ volume - BP's up a bit, home acei and norvasc restarted. Will add hold orders as we don't want BP's to drop too low.  PAF - new onset, per pmd MBD ckd - CCa and phos in range. No binders for now.  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 Get fe / tibc.  DM - on insulin, per pmd SP recent lower abd surgery 8/18 - underwent lap hysterectomy, bilateral salpingo-oopherectomy, and sacrocolpopexy by urology    RKelly Splinter8/23/2023, 12:55  PM   Recent Labs  Lab 06/06/22 0451 06/06/22 0452 06/07/22 0208  HGB 7.1*  --  8.1*  ALBUMIN 2.5* 2.5* 2.4*  2.5*  CALCIUM  --  7.6* 7.9*  PHOS  --  5.2* 3.7  CREATININE  --  6.19* 3.69*  K  --  4.7 3.7    No results for input(s): "IRON", "TIBC", "FERRITIN" in the last 168 hours. Inpatient medications:  amLODipine  5 mg Oral Daily   Chlorhexidine Gluconate Cloth  6 each Topical Q0600   heparin  5,000 Units Subcutaneous Q8H   lisinopril  20 mg Oral Daily    sodium chloride Stopped (06/07/22 0604)   acetaminophen     piperacillin-tazobactam (ZOSYN)  IV Stopped (06/07/22 0549)   sodium chloride, acetaminophen, heparin, [START ON 06/08/2022] heparin, white petrolatum

## 2022-06-07 NOTE — Progress Notes (Signed)
NAME:  Krista Gordon, MRN:  [id number], DOB:  Mar 06, 1941, LOS: 1 ADMISSION DATE:  06/05/2022, CONSULTATION DATE:  06/05/2022 REFERRING MD:  Dr. 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  Patient recently underwent robotic assisted laparoscopic hysterectomy, bilateral salpingo-oopherectomy, and sacrocolpopexy for pelvic organ prolapse on 8/18, discharged home on 8/19 with tramadol.  Family report she wasn't at her baseline when she was discharged, was confused, but they were trying to get her home for her scheduled dialysis on Saturday.  However, she was only able to tolerate one hour due to hypotension.  Since, family report ongoing weakness, lethargy, poor PO intake, with patient primarily sleeping since.  At baseline, family reports patient is fully dependent in her ADLs, drives, and still works as Neurosurgeon.   Found by EMS with SBP 79, CBG 64, room air saturation 79%, and responsive to verbal/ painful stimuli. O2 improved with 3L O2 and given glucagon.  IV access only able to be obtained in ER.  Was afebrile with improved SBP in the 90's.  Given 1.5L LR.  Labs noted for WBC 16.1, H/H 7.8/ 26.6 (10.1/ 35.7> 8.2/ 28.9 on discharge 8/19), plts 137, K 5.8, bicarb 18, BUN 42, sCr 6.8, alk phos 138, albumin 3.1, AST 126, INR 1.7, UA with large Hgb, RBC> 50, leukocytes, WBC> 50.  EKG noted afib, rate 90's (afib new for patient).  CXR noted for low lung volumes, stable cardiomegaly, chronic interstitial prominence attributed to underlying fibrosis.  CT head negative for acute intracranial abnormality.  CT abd/ pelvis noted new bilateral pleural effusions with bilateral lower lobe consolidation with evidence of underlying chronic fibrotic lung disease, and extensive soft tissue emphysema throughout the anterior abdominal wall with extension into  the perineum, likely related to recent surgery but no overt focal fluid collection or pneumoperitoneum.  Blood and urine cultures sent and started on empiric antibiotics.  PCCM called for admit.   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 rate 14, height $RemoveBe'5\' 2"'[id number]$  (1.575 m), weight 64.7 kg, SpO2 94 %.        Intake/Output Summary (Last 24 hours) at 06/06/2022 0741 Last data filed at 06/06/2022 0500 Gross per 24 hour  Intake 3719.47 ml  Output 60 ml  Net 3659.47 ml    Filed Weights   06/05/22 1138 06/05/22 2000 06/06/22 0300  Weight: 56.2 kg 64.7 kg 64.7 kg    Examination: General:  drowsy appearing elderly lady, NAD HEENT: MMM, pupils pinpoint and non-reactive to light, anicteric, arcus senilis, white buildup on tongue Neuro: drowsy but arouses to voice, oriented to name/place/location/year. Can follow commands CV: RRR, currently in NSR.  Lookout PULM:  non labored, not tachypneic, clear anteriorly, crackles in lung base bilaterally    GI: obese, soft, normal bowel sounds, port sites intact, approximated/ glued- wnl, has left groin wound with packing in place> no foul odor or drainage Extremities: warm/dry, no LE edema, AVF left forearm  Skin: no rashes   Labs/ Imaging  8/21 CTH> neg,  CT a/p   Resolved Hospital Problem list    Assessment & Plan:   Severe sepsis, likely related to aspiration pneumonia, possible UTI and can not fully rule out intra-abdominal process but less likely. Bilateral pleural effusions with lower lob consolidation on CT A/P. S/p 1.5L in ER.  Patient afebrile overnight, HDS w/ MAP > 65. Bcx and Ucx NGTD, WBC 8.3 (10.6), 734 cc intake, 145 cc  out. 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  Is not on home O2. Patient on 2 L LFNC this am, improved from 5 L overnight.  - CXR - high risk for intubation - sputum cx if intubated - NPO/ aspiration precautions - supplemental O2 prn for sat goal > 92% - abx as above - prn albuterol        ESRD on iHD TTS Hyperkalemia without EKG changes  - resolved AGMA/ lactic acidosis - resolved 8/19 iHD for only 1hr due to hypotension. Anion gap closed, bicarb drip off. Phos 3.7 (5.2), K 3.7 (4.7). Patient received dialysis yesterday.  - tele monitoring - Daily RFP - nephrology consulted, appreciate assistance  - dialysis TTS - trend renal indices/ strict I/Os   Acute metabolic/ septic encephalopathy CTH neg. AMS improved today, following some commands, but distractible, only oriented to self, place and year. EEG showed no seizure like activity, but was positive for encephalopathy. Ammonia level normal, patient also received dialysis yesterday. Patient also received narcan yesterday with no change in mental status, with appropriate CBG's making hypoglycemia unlikely.  - SLP swallow eval   - neuro checks q4h - supportive care as above    PAF- new onset  Afib POA, but resolved shortly after. CHA2DS2-VASc score 5. Patient in NSR today and overnight, with some runs of A. Flutter. Consider heparin gtt then bridge to DOAC if recurrence of A. Fib.  - tele monitor     Anemia of chronic disease + mild component of ABLA - baseline Hgb 9-10.  Hgb 10.1 pre-op down to 8.2 after surgery.  Today Hgb 8.1 (7.1) improved from yesterday, no signs or symptoms of bleeding.   -Continue to monitor/ trend.    -Transfuse for Hgb< 7   Mild Transaminitis  CT a/p noted chronic extrahepatic biliary dilatation post cholecystectomy, likely physiologic. Alk phos normal, AST rise to 665 (420), ALT rise 90 (47). RUQ US showed chronic common bile duct dilation, likely 2/2 cholecystectomy, no signs of stones or blockage. Hep B and Hep C screen negative.  -daily hepatic function panel   Coagulopathy INR 1.7 on admission. Will recheck today -F/u INR  Chronic thrombocytopenia No current evidence of bleeding. Plt drop to 106 (121) today. Reconsider DVT ppx if Plt < 70.   - Continue to monitor  - trend plts on CBC   Hx of DM, current A1c suggest no longer diabetic  Hypoglycemia, resolved, likely related to NPO/ sepsis. CBG range 73-118. - trend CBG q 4, - hypoglycemia protocol prn.    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 Louis Meckel 8/18. CT a/p with changes c/w post surgery. Wounds healing with no sign of infection.   - Trend clinical abd exams.  - urology aware of admit  Best Practice (right click  and "Reselect all SmartList Selections" daily)   Diet/type: NPO DVT prophylaxis: prophylactic heparin  GI prophylaxis: PPI Lines: N/A Foley:  Yes, and it is still needed Code Status:  full code Last date of multidisciplinary goals of care discussion - sisters updated at bedside 8/22  Labs   CBC: Recent Labs  Lab 06/02/22 0615 06/03/22 0530 06/05/22 1347 06/05/22 2232 06/06/22 0451  WBC 6.2  --  16.1*  --  10.6*  NEUTROABS 3.5  --  13.8*  --   --   HGB 10.1* 8.2* 7.8* 8.8* 7.1*  HCT 35.7* 28.9* 26.6* 26.0* 23.5*  MCV 104.7*  --  106.4*  --  101.7*  PLT 121*  --  137*  --  121*      Basic Metabolic Panel: Recent Labs  Lab 06/03/22 0530 06/05/22 1255 06/05/22 2232 06/05/22 2328 06/06/22 0115 06/06/22 0452  NA 138 139 139 143 139 142  K 5.2* 5.8* 5.1 7.2* 5.9* 4.7  CL 103 103  --  104 101 99  CO2 27  18*  --  21* 25 29  GLUCOSE 76 90  --  123* 131* 103*  BUN 22 42*  --  48* 45* 43*  CREATININE 4.59* 6.80*  --  6.51* 6.26* 6.19*  CALCIUM 8.0* 8.4*  --  8.6* 7.6* 7.6*  PHOS  --   --   --   --   --  5.2*    GFR: Estimated Creatinine Clearance: 6.5 mL/min (A) (by C-G formula based on SCr of 6.19 mg/dL (H)). Recent Labs  Lab 06/02/22 0615 06/05/22 1254 06/05/22 1347 06/05/22 1511 06/05/22 2328 06/06/22 0451  WBC 6.2  --  16.1*  --   --  10.6*  LATICACIDVEN  --  6.6*  --  3.5* 2.7*  --      Liver Function Tests: Recent Labs  Lab 06/02/22 0615 06/05/22 1255 06/06/22 0451 06/06/22 0452  AST 21 126* 420*  --   ALT 10 19 47*  --   ALKPHOS 82 138* 96  --   BILITOT 0.7 <0.1* 0.5  --   PROT 7.0 6.9 5.5*  --   ALBUMIN 3.2* 3.1* 2.5* 2.5*    No results for input(s): "LIPASE", "AMYLASE" in the last 168 hours. Recent Labs  Lab 06/05/22 2328  AMMONIA 81*    ABG    Component Value Date/Time   PHART 7.355 06/05/2022 2232   PCO2ART 50.0 (H) 06/05/2022 2232   PO2ART 78 (L) 06/05/2022 2232   HCO3 28.0 06/05/2022 2232   TCO2 30 06/05/2022 2232   ACIDBASEDEF 3.3 (H) 06/05/2022 1356   O2SAT 95 06/05/2022 2232     Coagulation Profile: Recent Labs  Lab 06/05/22 1255  INR 1.7*     Cardiac Enzymes: No results for input(s): "CKTOTAL", "CKMB", "CKMBINDEX", "TROPONINI" in the last 168 hours.  HbA1C: Hgb A1c MFr Bld  Date/Time Value Ref Range Status  06/02/2022 06:15 AM 4.0 (L) 4.8 - 5.6 % Final    Comment:    (NOTE) Pre diabetes:          5.7%-6.4%  Diabetes:              >6.4%  Glycemic control for   <7.0% adults with diabetes   10/17/2021 05:11 AM 4.3 (L) 4.8 - 5.6 % Final    Comment:    (NOTE) Pre diabetes:          5.7%-6.4%  Diabetes:              >  6.4%  Glycemic control for   <7.0% adults with diabetes     CBG: Recent Labs  Lab 06/02/22 1203 06/05/22 1138 06/05/22 2027 06/05/22 2317 06/06/22 0323  GLUCAP 194* 70 119* 96 107*     Review  of Systems:   Unable given encephalopathy   Past Medical History:  She,  has a past medical history of Anemia, COVID, Diabetes mellitus without complication ([place]), ESRD on hemodialysis ([place]), Gall stone, Gout, Hyperlipidemia, and Hypertension.   Surgical History:   Past Surgical History:  Procedure Laterality Date   A/V FISTULAGRAM Left 12/05/2021   Procedure: A/V Fistulagram;  Surgeon: Algernon Huxley, MD;  Location: [place] [facility];  Service: Cardiovascular;  Laterality: Left;   A/V SHUNTOGRAM Left 02/16/2020   Procedure: A/V SHUNTOGRAM;  Surgeon: Algernon Huxley, MD;  Location: [place] [facility];  Service: Cardiovascular;  Laterality: Left;   AV FISTULA PLACEMENT Left 01/15/2020   Procedure: INSERTION OF ARTERIOVENOUS (AV) GORE-TEX GRAFT ARM;  Surgeon: Algernon Huxley, MD;  Location: [facility];  Service: Vascular;  Laterality: Left;   COLONOSCOPY WITH PROPOFOL N/A 01/27/2016   Procedure: COLONOSCOPY WITH PROPOFOL;  Surgeon: Hulen Luster, MD;  [facility];  Service: Gastroenterology;  Laterality: N/A;   ROBOTIC ASSISTED LAPAROSCOPIC SACROCOLPOPEXY Bilateral 06/02/2022   Procedure: XI ROBOTIC ASSISTED LAPAROSCOPIC SACROCOLPOPEXY SUPRACEVICAL HYSTERECTOMY AND BILATERAL SALPINGO OOPHERECTOMY;  Surgeon: Ardis Hughs, MD;  Location: [facility];  Service: Urology;  Laterality: Bilateral;  4.5 HRS FOR THIS CASE   TUBAL LIGATION       Social History:   reports that she has quit smoking. She has never used smokeless tobacco. She reports that she does not drink alcohol and does not use drugs.   Family History:  Her family history includes Breast cancer (age of onset: 78) in her sister.   Allergies Allergies  Allergen Reactions   Mircera [Methoxy Polyethylene Glycol-Epoetin Beta]     Patient doesn't recall this allergy   Tramadol Itching     Home Medications  Prior to Admission medications   Medication Sig Start Date End Date Taking? Authorizing Provider  amLODipine (NORVASC) 5 MG  tablet Take 5 mg by mouth daily. 01/02/22   [provider]  atorvastatin (LIPITOR) 40 MG tablet Take 40 mg by mouth at bedtime.     [provider]  cephALEXin (KEFLEX) 500 MG capsule Take 1 capsule (500 mg total) by mouth at bedtime for 5 days. 06/03/22 06/08/22  Cleon Gustin, MD  colchicine 0.6 MG tablet Take 0.6 mg by mouth every other day.    [provider]  estradiol (ESTRACE VAGINAL) 0.1 MG/GM vaginal cream Place 1 Applicatorful vaginally 3 (three) times a week. Use 1 small dolyp of cream on tip of index finger and swap the inside of the vagina 06/02/22   Ardis Hughs, MD  famotidine (PEPCID) 10 MG tablet Take 1 tablet (10 mg total) by mouth daily. Patient not taking: Reported on 05/15/2022 10/20/21 12/05/21  Kayleen Memos, DO  ferrous sulfate 325 (65 FE) MG tablet Take 325 mg by mouth daily.     [provider]  lisinopril (ZESTRIL) 40 MG tablet Take 40 mg by mouth daily.    [provider]  loratadine (CLARITIN) 10 MG tablet Take 10 mg by mouth every other day. Patient not taking: Reported on 05/15/2022 01/02/22   [provider]  multivitamin (RENA-VIT) TABS tablet Take 1 tablet by mouth at bedtime. Patient not taking: Reported on  05/15/2022 10/20/21   Kayleen Memos, DO  mupirocin ointment (BACTROBAN) 2 % Apply topically 2 (two) times daily. Patient not taking: Reported on 05/15/2022 10/20/21   Kayleen Memos, DO  PROAIR HFA 108 [phone] Base) MCG/ACT inhaler Inhale 2 puffs into the lungs every 6 (six) hours as needed for shortness of breath. 06/15/18   [provider]  sodium bicarbonate 650 MG tablet Take 1,300 mg by mouth 2 (two) times daily.    [provider]  traMADol (ULTRAM) 50 MG tablet Take 1-2 tablets (50-100 mg total) by mouth every 6 (six) hours as needed for moderate pain or severe pain. 06/02/22   Debbrah Alar, PA-C     Critical care time: 14 mins       Holley Bouche, MD PGY-2 [facility] 06/07/2022,  8:41 AM  See Shea Evans for pager If no response to pager, please call PCCM consult pager After 7:00 pm call Elink

## 2022-06-07 NOTE — Procedures (Signed)
Thoracentesis  Procedure Note  DEMI TRIEU  [id number]  Mar 01, 1941  Date:06/07/22  Time:2:11 PM   Provider Performing:Dr Sowell under direction of Candee Furbish   Procedure: Thoracentesis with imaging guidance [phone])  Indication(s) Pleural Effusion  Consent Risks of the procedure as well as the alternatives and risks of each were explained to the patient and/or caregiver.  Consent for the procedure was obtained and is signed in the bedside chart  Anesthesia Topical only with 1% lidocaine    Time Out Verified patient identification, verified procedure, site/side was marked, verified correct patient position, special equipment/implants available, medications/allergies/relevant history reviewed, required imaging and test results available.   Sterile Technique Maximal sterile technique including full sterile barrier drape, hand hygiene, sterile gown, sterile gloves, mask, hair covering, sterile ultrasound probe cover (if used).  Procedure Description Ultrasound was used to identify appropriate pleural anatomy for placement and overlying skin marked.  Area of drainage cleaned and draped in sterile fashion. Lidocaine was used to anesthetize the skin and subcutaneous tissue.  600 cc's of straw appearing fluid was drained from the right pleural space. Catheter then removed and bandaid applied to site.   Complications/Tolerance None; patient tolerated the procedure well. Chest X-ray is ordered to confirm no post-procedural complication.   EBL Minimal   Specimen(s) Pleural fluid

## 2022-06-07 NOTE — Progress Notes (Signed)
OT Cancellation Note  Patient Details Name: Krista Gordon MRN: [id number] DOB: 10-26-1940   Cancelled Treatment:    Reason Eval/Treat Not Completed: Patient at procedure or test/ unavailable. Pt receiving HD at this time at the bedside. Will hold therapy today and follow up for Evaluation tomorrow.   Rutilio Yellowhair Elane Yolanda Bonine 06/07/2022, 3:47 PM

## 2022-06-07 NOTE — Progress Notes (Signed)
Initial Nutrition Assessment  DOCUMENTATION CODES:   Not applicable  INTERVENTION:   Add PO supplements when diet is advanced: Nepro Shake po TID, each supplement provides 425 kcal and 19 grams protein; and renal MVI daily.  NUTRITION DIAGNOSIS:   Increased nutrient needs related to chronic illness (ESRD on HD) as evidenced by estimated needs.  GOAL:   Patient will meet greater than or equal to 90% of their needs  MONITOR:   Diet advancement, PO intake, Labs  REASON FOR ASSESSMENT:   Malnutrition Screening Tool    ASSESSMENT:   81 yo female admitted with AMS. PMH includes ESRD on HD, DM, anemia, HLD, HTN.  Discussed patient in ICU rounds and with RN today. S/P hysterectomy 06/02/22. Concern that AMS is related to anesthesia and pain medications given in the hospital. Patient receiving nursing care during RD visit. Spoke with family member in her room who reports patient was eating "everything in sight" at home and that her weight was stable. Patient unable to provide any information, she is confused. Currently NPO. Awaiting SLP evaluation for diet advancement. Last HD yesterday. Plans to repeat HD today.   Labs reviewed. Na 134, ionized calcium 1.05 CBG: 104-102-101  Medications reviewed.  Weight history reviewed. Weight is up from usual weight likely r/t fluid overload. Unsure of EDW. Weights have fluctuated between 56.2 kg and 64.9 kg over the past 12 months.   NUTRITION - FOCUSED PHYSICAL EXAM:  Unable to complete  Diet Order:   Diet Order             Diet NPO time specified  Diet effective now                   EDUCATION NEEDS:   No education needs have been identified at this time  Skin:  Skin Assessment: Reviewed RN Assessment  Last BM:  8/23  Height:   Ht Readings from Last 1 Encounters:  06/05/22 '5\' 2"'$  (1.575 m)    Weight:   Wt Readings from Last 1 Encounters:  06/07/22 66.8 kg    Ideal Body Weight:  50 kg  BMI:  Body mass index  is 26.94 kg/m.  Estimated Nutritional Needs:   Kcal:  1500-1750  Protein:  70-80 gm  Fluid:  1 L + UOP   Lucas Mallow RD, LDN, CNSC Please refer to Amion for contact information.

## 2022-06-07 NOTE — Progress Notes (Signed)
Modified Barium Swallow Progress Note  Patient Details  Name: Krista Gordon MRN: [id number] Date of Birth: [birth year]  Today's Date: 06/07/2022  Modified Barium Swallow completed.  Full report located under Chart Review in the Imaging Section.  Brief recommendations include the following:  Clinical Impression  Pt demonstrates mild oropahryngeal impiarment. Oral manipulation of puree and mastication and bolus formation of regular solids is prolonged and laborious. Pt has dentures but no denture paste which may be impacting effective chewing. She is also confused. 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 Pt has to swallow 2-3 time per bolus due to this strucural change. No aspriration occurred. Esophageal sweep showed barium tablet hesitate in the distal esophagus, but it passed with further liquid swallows. Pt is recommended to consume a puree diet with thin liquids. SLP will f/u to advance diet next week if mentation has improved.   Swallow Evaluation Recommendations       SLP Diet Recommendations: Dysphagia 1 (Puree) solids;Thin liquid   Liquid Administration via: Cup;Straw   Medication Administration: Whole meds with liquid   Supervision: Staff to assist with self feeding;Full supervision/cueing for compensatory strategies   Compensations: Slow rate;Small sips/bites               Herbie Baltimore, MA CCC-SLP  Acute Rehabilitation Services Secure Chat Preferred Office [phone]  Janziel Hockett, Katherene Ponto 06/07/2022,2:57 PM

## 2022-06-07 NOTE — Evaluation (Signed)
Clinical/Bedside Swallow Evaluation Patient Details  Name: Krista Gordon MRN: [id number] Date of Birth: [birth year]  Today's Date: 06/07/2022 Time: SLP Start Time (ACUTE ONLY): 0920 SLP Stop Time (ACUTE ONLY): 0935 SLP Time Calculation (min) (ACUTE ONLY): 15 min  Past Medical History:  Past Medical History:  Diagnosis Date   Anemia    COVID    patient states had COVID "over a month ago and in hospital 3 days"   Diabetes mellitus without complication ([place])    ESRD on hemodialysis ([place])    kidney disease   Gall stone    Gout    Hyperlipidemia    Hypertension    Past Surgical History:  Past Surgical History:  Procedure Laterality Date   A/V FISTULAGRAM Left 12/05/2021   Procedure: A/V Fistulagram;  Surgeon: Algernon Huxley, MD;  Location: [place] [facility];  Service: Cardiovascular;  Laterality: Left;   A/V SHUNTOGRAM Left 02/16/2020   Procedure: A/V SHUNTOGRAM;  Surgeon: Algernon Huxley, MD;  Location: [place] [facility];  Service: Cardiovascular;  Laterality: Left;   AV FISTULA PLACEMENT Left 01/15/2020   Procedure: INSERTION OF ARTERIOVENOUS (AV) GORE-TEX GRAFT ARM;  Surgeon: Algernon Huxley, MD;  Location: [facility];  Service: Vascular;  Laterality: Left;   COLONOSCOPY WITH PROPOFOL N/A 01/27/2016   Procedure: COLONOSCOPY WITH PROPOFOL;  Surgeon: Hulen Luster, MD;  Location: [facility];  Service: Gastroenterology;  Laterality: N/A;   ROBOTIC ASSISTED LAPAROSCOPIC SACROCOLPOPEXY Bilateral 06/02/2022   Procedure: XI ROBOTIC ASSISTED LAPAROSCOPIC SACROCOLPOPEXY SUPRACEVICAL HYSTERECTOMY AND BILATERAL SALPINGO OOPHERECTOMY;  Surgeon: Ardis Hughs, MD;  Location: [facility];  Service: Urology;  Laterality: Bilateral;  4.5 HRS FOR THIS CASE   TUBAL LIGATION     HPI:  81 year old female with PMH as below, significant for 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 Suspected aspiration pna given CT chest with new bilateral pleural effusions with bilateral  lower lobe consolidation with evidence of underlying chronic fibrotic lung disease  Patient recently underwent robotic assisted laparoscopic hysterectomy, bilateral salpingo-oopherectomy, and sacrocolpopexy for pelvic organ prolapse on 8/18, discharged home on 8/19 with tramadol.  Family report she wasn't at her baseline when she was discharged, was confused, but they were trying to get her home for her scheduled dialysis on Saturday.  However, she was only able to tolerate one hour due to hypotension.  Since, family report ongoing weakness, lethargy, poor PO intake, with patient primarily sleeping since.  At baseline, family reports patient is fully dependent in her ADLs, drives, and still works as Neurosurgeon.    Assessment / Plan / Recommendation  Clinical Impression  Pt demonstrates subtle signs that could be indicative of regurgitation or esophageal dysphagia including belching, multiple swallows and delayed cough. Will need instrumental assessment for more definitive diagnosis. Will proceed today. Pt able to take sips of water or pills in puree as needed in the meantime. SLP Visit Diagnosis: Dysphagia, unspecified (R13.10)    Aspiration Risk  Mild aspiration risk    Diet Recommendation NPO except meds;Free water protocol after oral care   Liquid Administration via: Cup;Straw Medication Administration: Whole meds with puree Supervision: Staff to assist with self feeding;Full supervision/cueing for compensatory strategies Compensations: Slow rate;Small sips/bites Postural Changes: Seated upright at 90 degrees    Other  Recommendations Oral Care Recommendations: Oral care BID    Recommendations for follow up therapy are one component of a multi-disciplinary discharge planning process, led by the  attending physician.  Recommendations may be updated based on patient status, additional functional criteria and insurance authorization.  Follow up Recommendations        Assistance  Recommended at Discharge    Functional Status Assessment    Frequency and Duration            Prognosis        Swallow Study   General HPI: 81 year old female with PMH as below, significant for 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  Family report she wasn't at her baseline when she was discharged, was confused, but they were trying to get her home for her scheduled dialysis on Saturday.  However, she was only able to tolerate one hour due to hypotension.  Since, family report ongoing weakness, lethargy, poor PO intake, with patient primarily sleeping since.  At baseline, family reports patient is fully dependent in her ADLs, drives, and still works as Neurosurgeon. Type of Study: Bedside Swallow Evaluation Previous Swallow Assessment: none Diet Prior to this Study: NPO Temperature Spikes Noted: No Respiratory Status: Room air History of Recent Intubation: No Behavior/Cognition: Alert;Cooperative Oral Cavity Assessment:  (coated tongue) Oral Care Completed by SLP: No Oral Cavity - Dentition: Dentures, top;Dentures, bottom Vision: Functional for self-feeding Self-Feeding Abilities: Needs assist Patient Positioning: Upright in chair Baseline Vocal Quality: Normal Volitional Cough: Strong Volitional Swallow: Able to elicit    Oral/Motor/Sensory Function Overall Oral Motor/Sensory Function: Within functional limits   Ice Chips Ice chips: Not tested   Thin Liquid Thin Liquid: Impaired Presentation: Cup;Straw Pharyngeal  Phase Impairments: Multiple swallows;Cough - Delayed     Nectar Thick Nectar Thick Liquid: Not tested   Honey Thick Honey Thick Liquid: Not tested   Puree Puree: Within functional limits   Solid     Solid: Not tested      Lynann Beaver 06/07/2022,12:49 PM

## 2022-06-07 NOTE — Discharge Summary (Signed)
Physician Discharge Summary  Patient ID: Krista Gordon MRN: [id number] DOB/AGE: Apr 30, 1941 81 y.o.  Admit date: 06/02/2022 Discharge date: 06/03/2022  Admission Diagnoses:  Prolapse of female pelvic organs  Discharge Diagnoses:  Principal Problem:   Prolapse of female pelvic organs   Past Medical History:  Diagnosis Date   Anemia    COVID    patient states had COVID "over a month ago and in hospital 3 days"   Diabetes mellitus without complication ([place])    ESRD on hemodialysis ([place])    kidney disease   Gall stone    Gout    Hyperlipidemia    Hypertension     Surgeries: Procedure(s): XI ROBOTIC ASSISTED LAPAROSCOPIC SACROCOLPOPEXY SUPRACEVICAL HYSTERECTOMY AND BILATERAL SALPINGO OOPHERECTOMY on 06/02/2022   Consultants (if any):   Discharged Condition: Improved  Hospital Course: Krista Gordon is an 81 y.o. female who was admitted 06/02/2022 with a diagnosis of Prolapse of female pelvic organs and went to the operating room on 06/02/2022 and underwent the above named procedures.    She was given perioperative antibiotics:  Anti-infectives (From admission, onward)    Start     Dose/Rate Route Frequency Ordered Stop   06/02/22 2200  cephALEXin (KEFLEX) capsule 500 mg  Status:  Discontinued        500 mg Oral Every 12 hours 06/02/22 1520 06/02/22 1603   06/02/22 2200  cephALEXin (KEFLEX) capsule 500 mg  Status:  Discontinued        500 mg Oral Daily at bedtime 06/02/22 1603 06/03/22 1606   06/02/22 0827  ciprofloxacin (CIPRO) 400 MG/200ML IVPB       Note to Pharmacy: Carleene Cooper A: cabinet override      06/02/22 0827 06/02/22 0833   06/02/22 0553  ceFAZolin (ANCEF) IVPB 2g/100 mL premix        2 g 200 mL/hr over 30 Minutes Intravenous 30 min pre-op 06/02/22 0553 06/02/22 0803   06/02/22 0000  cephALEXin (KEFLEX) 500 MG capsule  Status:  Discontinued        500 mg Oral 2 times daily 06/02/22 1158 06/02/22      .  She was given sequential compression devices,  early ambulation for DVT prophylaxis.  She benefited maximally from the hospital stay and there were no complications.  She was discharged in stable condition  Recent vital signs:  Vitals:   06/03/22 0641 06/03/22 0821  BP: (!) 102/55 (!) 102/54  Pulse: 79 79  Resp: 16 20  Temp: 98 F (36.7 C) (!) 97.5 F (36.4 C)  SpO2: 98% 99%    Recent laboratory studies:  Lab Results  Component Value Date   HGB 8.1 (L) 06/07/2022   HGB 7.1 (L) 06/06/2022   HGB 8.8 (L) 06/05/2022   Lab Results  Component Value Date   WBC 8.3 06/07/2022   PLT 106 (L) 06/07/2022   Lab Results  Component Value Date   INR 1.5 (H) 06/07/2022   Lab Results  Component Value Date   NA 134 (L) 06/07/2022   K 3.7 06/07/2022   CL 96 (L) 06/07/2022   CO2 29 06/07/2022   BUN 18 06/07/2022   CREATININE 3.69 (H) 06/07/2022   GLUCOSE 91 06/07/2022    Discharge Medications:   Allergies as of 06/03/2022       Reactions   Mircera [methoxy Polyethylene Glycol-epoetin Beta]    Patient doesn't recall this allergy        Medication List     STOP taking these  medications    aspirin EC 81 MG tablet       TAKE these medications    amLODipine 5 MG tablet Commonly known as: NORVASC Take 5 mg by mouth daily.   atorvastatin 40 MG tablet Commonly known as: LIPITOR Take 40 mg by mouth at bedtime.   cephALEXin 500 MG capsule Commonly known as: KEFLEX Take 1 capsule (500 mg total) by mouth at bedtime for 5 days.   colchicine 0.6 MG tablet Take 0.6 mg by mouth every other day.   estradiol 0.1 MG/GM vaginal cream Commonly known as: ESTRACE VAGINAL Place 1 Applicatorful vaginally 3 (three) times a week. Use 1 small dolyp of cream on tip of index finger and swap the inside of the vagina   famotidine 10 MG tablet Commonly known as: PEPCID Take 1 tablet (10 mg total) by mouth daily.   lisinopril 40 MG tablet Commonly known as: ZESTRIL Take 40 mg by mouth daily.   multivitamin Tabs tablet Take 1  tablet by mouth at bedtime.   mupirocin ointment 2 % Commonly known as: BACTROBAN Apply topically 2 (two) times daily.   ProAir HFA 108 (90 Base) MCG/ACT inhaler Generic drug: albuterol Inhale 2 puffs into the lungs every 6 (six) hours as needed for shortness of breath.   sodium bicarbonate 650 MG tablet Take 1,300 mg by mouth 2 (two) times daily.   traMADol 50 MG tablet Commonly known as: Ultram Take 1-2 tablets (50-100 mg total) by mouth every 6 (six) hours as needed for moderate pain or severe pain.        Diagnostic Studies: DG CHEST PORT 1 VIEW  Result Date: 06/07/2022 CLINICAL DATA:  Shortness of the breath common pneumonia. EXAM: PORTABLE CHEST 1 VIEW COMPARISON:  Chest radiograph June 06, 2022 FINDINGS: The heart size and mediastinal contours are partially obscured but appear unchanged. Interval progression of the bilateral right-greater-than-left interstitial and airspace opacities. Stable small right with new probable trace left pleural effusions. No acute osseous abnormality. IMPRESSION: Progression of right-greater-than-left interstitial and airspace opacities with a stable small right and probable new left pleural effusion, most consistent with worsening pneumonia. Electronically Signed   By: Dahlia Bailiff M.D.   On: 06/07/2022 08:35   ECHOCARDIOGRAM COMPLETE  Result Date: 06/06/2022    ECHOCARDIOGRAM REPORT   Patient Name:   Krista Gordon Date of Exam: 06/06/2022 Medical Rec #:  [id number]        Height:       62.0 in Accession #:    [id number]       Weight:       142.6 lb Date of Birth:  [birth year]        BSA:          1.656 m Patient Age:    72 years         BP:           102/55 mmHg Patient Gender: F                HR:           77 bpm. Exam Location:  Inpatient Procedure: 2D Echo, Cardiac Doppler, Color Doppler and Intracardiac            Opacification Agent Indications:    Cardiomyopathy  History:        Patient has no prior history of Echocardiogram examinations.                  Risk Factors:Hypertension, Diabetes and  Dyslipidemia.  Sonographer:    Jefferey Pica Referring Phys: [id number] [place]  1. Left ventricular ejection fraction, by estimation, is 50 to 55%. The left ventricle has low normal function. The left ventricle demonstrates global hypokinesis. Left ventricular diastolic parameters are consistent with Grade I diastolic dysfunction (impaired relaxation). There is the interventricular septum is flattened in diastole ('D' shaped left ventricle), consistent with right ventricular volume overload.  2. Right ventricular systolic function is mildly reduced. The right ventricular size is mildly enlarged. There is severely elevated pulmonary artery systolic pressure. The estimated right ventricular systolic pressure is 33.3 mmHg.  3. Right atrial size was severely dilated.  4. The mitral valve is normal in structure. No evidence of mitral valve regurgitation.  5. Tricuspid valve regurgitation is moderate.  6. The aortic valve is tricuspid. Aortic valve regurgitation is not visualized.  7. The inferior vena cava is dilated in size with <50% respiratory variability, suggesting right atrial pressure of 15 mmHg. Comparison(s): No prior Echocardiogram. Conclusion(s)/Recommendation(s): Significantly elevated PASP. FINDINGS  Left Ventricle: Left ventricular ejection fraction, by estimation, is 50 to 55%. The left ventricle has low normal function. The left ventricle demonstrates global hypokinesis. The left ventricular internal cavity size was normal in size. There is no left ventricular hypertrophy. The interventricular septum is flattened in diastole ('D' shaped left ventricle), consistent with right ventricular volume overload. Left ventricular diastolic parameters are consistent with Grade I diastolic dysfunction (impaired relaxation). Right Ventricle: The right ventricular size is mildly enlarged. Right ventricular systolic function is mildly reduced.  There is severely elevated pulmonary artery systolic pressure. The tricuspid regurgitant velocity is 3.55 m/s, and with an assumed right atrial pressure of 15 mmHg, the estimated right ventricular systolic pressure is 54.5 mmHg. Left Atrium: Left atrial size was normal in size. Right Atrium: Right atrial size was severely dilated. Pericardium: There is no evidence of pericardial effusion. Mitral Valve: The mitral valve is normal in structure. There is mild thickening of the mitral valve leaflet(s). No evidence of mitral valve regurgitation. Tricuspid Valve: Tricuspid valve regurgitation is moderate. Aortic Valve: The aortic valve is tricuspid. Aortic valve regurgitation is not visualized. Aortic valve peak gradient measures 7.7 mmHg. Pulmonic Valve: The pulmonic valve was normal in structure. Pulmonic valve regurgitation is mild. Aorta: The aortic root and ascending aorta are structurally normal, with no evidence of dilitation. Venous: The inferior vena cava is dilated in size with less than 50% respiratory variability, suggesting right atrial pressure of 15 mmHg.  LEFT VENTRICLE PLAX 2D LVIDd:         4.60 cm   Diastology LVIDs:         3.25 cm   LV e' medial:    6.25 cm/s LV PW:         1.00 cm   LV E/e' medial:  8.3 LV IVS:        0.80 cm   LV e' lateral:   11.10 cm/s LVOT diam:     2.00 cm   LV E/e' lateral: 4.7 LV SV:         64 LV SV Index:   39 LVOT Area:     3.14 cm  IVC IVC diam: 2.60 cm LEFT ATRIUM           Index        RIGHT ATRIUM           Index LA diam:      3.50 cm 2.11 cm/m   RA Area:  26.90 cm LA Vol (A2C): 47.1 ml 28.44 ml/m  RA Volume:   113.00 ml 68.24 ml/m LA Vol (A4C): 38.9 ml 23.49 ml/m  AORTIC VALVE                 PULMONIC VALVE AV Area (Vmax): 2.58 cm     PV Vmax:       0.72 m/s AV Vmax:        139.00 cm/s  PV Peak grad:  2.1 mmHg AV Peak Grad:   7.7 mmHg LVOT Vmax:      114.00 cm/s LVOT Vmean:     62.000 cm/s LVOT VTI:       0.204 m  AORTA Ao Root diam: 3.10 cm Ao Asc diam:   2.90 cm MITRAL VALVE               TRICUSPID VALVE MV Area (PHT): 2.81 cm    TR Peak grad:   50.4 mmHg MV Decel Time: 270 msec    TR Vmax:        355.00 cm/s MV E velocity: 52.10 cm/s MV A velocity: 78.00 cm/s  SHUNTS MV E/A ratio:  0.67        Systemic VTI:  0.20 m                            Systemic Diam: 2.00 cm Phineas Inches Electronically signed by Phineas Inches Signature Date/Time: 06/06/2022/3:24:56 PM    Final    US Abdomen Limited RUQ (LIVER/GB)  Result Date: 06/06/2022 CLINICAL DATA:  Transaminitis. EXAM: ULTRASOUND ABDOMEN LIMITED RIGHT UPPER QUADRANT COMPARISON:  CT abdomen pelvis 06/05/2022 FINDINGS: Gallbladder: Surgically removed Common bile duct: Diameter: 10 mm, unchanged from the prior CT. Also no change from CT of 08/03/2021. Likely post cholecystectomy dilatation. Liver: No focal lesion identified. Within normal limits in parenchymal echogenicity. Portal vein is patent on color Doppler imaging with normal direction of blood flow towards the liver. Other: None IMPRESSION: Postop cholecystectomy. Chronic common bile duct dilatation 10 mm likely due to cholecystectomy. Electronically Signed   By: Franchot Gallo M.D.   On: 06/06/2022 13:49   EEG adult  Result Date: 06/06/2022 Lora Havens, MD     06/06/2022 12:06 PM Patient Name: LINDAMARIE MACLACHLAN MRN: [id number] Epilepsy Attending: Lora Havens Referring Physician/Provider: Holley Bouche, MD Date: 06/06/2022 Duration: 22.53 mins Patient history: 81 year old female with altered mental status.  EEG to evaluate  for seizure. Level of alertness: Awake AEDs during EEG study: None Technical aspects: This EEG study was done with scalp electrodes positioned according to the 10-20 International system of electrode placement. Electrical activity was reviewed with band pass filter of 1-'70Hz'$ , sensitivity of 7 uV/mm, display speed of 65m/sec with a '60Hz'$  notched filter applied as appropriate. EEG data were recorded continuously and digitally stored.   Video monitoring was available and reviewed as appropriate. Description: No clear posterior dominant rhythm was seen. EEG showed continuous generalized 3 to 6 Hz theta-delta slowing. Hyperventilation and photic stimulation were not performed.   ABNORMALITY - Continuous slow, generalized IMPRESSION: This study is suggestive of moderate diffuse encephalopathy, nonspecific etiology. No seizures or epileptiform discharges were seen throughout the recording. PLora Havens  DG CHEST PORT 1 VIEW  Result Date: 06/06/2022 CLINICAL DATA:  Aspiration pneumonia EXAM: PORTABLE CHEST 1 VIEW COMPARISON:  Chest 06/05/2022 FINDINGS: Diffuse bilateral airspace disease has progressed. This is more severe on the right than the left and involves  upper and lower lungs. Small right pleural effusion has progressed. No significant left effusion. Progression of bilateral airspace disease right greater than left compatible with pneumonia. Progression of small right pleural effusion. IMPRESSION: No active disease. Electronically Signed   By: Franchot Gallo M.D.   On: 06/06/2022 09:47   CT ABDOMEN PELVIS W CONTRAST  Result Date: 06/05/2022 CLINICAL DATA:  Abdominal pain, acute, nonlocalized. Altered mental status. EXAM: CT ABDOMEN AND PELVIS WITH CONTRAST TECHNIQUE: Multidetector CT imaging of the abdomen and pelvis was performed using the standard protocol following bolus administration of intravenous contrast. RADIATION DOSE REDUCTION: This exam was performed according to the departmental dose-optimization program which includes automated exposure control, adjustment of the mA and/or kV according to patient size and/or use of iterative reconstruction technique. CONTRAST:  94m OMNIPAQUE IOHEXOL 300 MG/ML  SOLN COMPARISON:  Noncontrast abdominopelvic CT 08/03/2021. FINDINGS: Lower chest: Moderate right and small left pleural effusion are noted with bilateral lower lobe consolidation suspicious for pneumonia or aspiration.  Underlying diffuse interstitial prominence noted. The heart is mildly enlarged. Hepatobiliary: The liver is normal in density without suspicious focal abnormality. Extrahepatic biliary dilatation is similar to the previous study and may be physiologic status post cholecystectomy. No calcified intraductal calculi identified. Pancreas: Unremarkable. No pancreatic ductal dilatation or surrounding inflammatory changes. Spleen: Normal in size without focal abnormality. Adrenals/Urinary Tract: Both adrenal glands appear normal. The left kidney appears unremarkable. 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 Appearance is unchanged from the prior examination. A Foley catheter is in place with decompression of the bladder. There is possible bladder wall thickening and surrounding inflammation. Stomach/Bowel: No enteric contrast administered. The stomach appears unremarkable for its degree of distension. No evidence of bowel wall thickening, distention or surrounding inflammatory change. 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 Vascular/Lymphatic: There are no enlarged abdominal or pelvic lymph nodes. Aortic and branch vessel atherosclerosis without evidence of aneurysm or large vessel occlusion. The portal, superior mesenteric and splenic veins are patent. Reproductive: Hysterectomy.  No adnexal mass. Other: Possible small amount of free pelvic fluid. No focal extraluminal fluid or gas collections are seen within the peritoneal cavity or retroperitoneum. However, there is extensive subcutaneous emphysema throughout the anterior abdominal wall extending into the perineum. Musculoskeletal: No acute or significant osseous findings. Diffuse thoracolumbar spondylosis with bridging osteophytes and facet hypertrophy. IMPRESSION: 1. Extensive soft tissue emphysema throughout the anterior abdominal wall with  extension into the perineum. In discussion with Dr. RJeanell Sparrow patient underwent robotic assisted laparoscopic hysterectomy 3 days ago, and this emphysema is likely related to this procedure, not necessarily implying a complication or soft tissue infection. Correlate clinically. No pneumoperitoneum or focal fluid collection. 2. New bilateral pleural effusions with lower lobe consolidation suspicious for aspiration in the setting of recent surgery. 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 3. The bladder is decompressed by a Foley catheter. Possible bladder wall thickening which could be inflammatory. 4. 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 Chronic extrahepatic biliary dilatation post cholecystectomy, likely physiologic. Aortic Atherosclerosis (ICD10-I70.0). 5. These results were called by telephone at the time of interpretation on 06/05/2022 at 2:08 pm to provider [facility] , who verbally acknowledged these results. Electronically Signed   By: WRichardean SaleM.D.   On: 06/05/2022 14:09   CT Head Wo Contrast  Result Date: 06/05/2022 CLINICAL DATA:  Mental status change.  EXAM: CT HEAD WITHOUT CONTRAST TECHNIQUE: Contiguous axial images were obtained from the base of the skull through the vertex without intravenous contrast. RADIATION DOSE REDUCTION: This exam was performed according to the departmental dose-optimization program which includes automated exposure control, adjustment of the mA and/or kV according to patient size and/or use of iterative reconstruction technique. COMPARISON:  None Available. FINDINGS: Brain: No evidence of acute infarction, hemorrhage, hydrocephalus, extra-axial collection or mass lesion/mass effect. Vascular: No hyperdense vessel or unexpected calcification. Skull: Normal. Negative for fracture or focal lesion. Sinuses/Orbits: No acute finding. Other: None. IMPRESSION: No acute intracranial  abnormality. Electronically Signed   By: Keane Police D.O.   On: 06/05/2022 13:38   DG Chest Port 1 View  Result Date: 06/05/2022 CLINICAL DATA:  Found unresponsive. Altered mental status. History of hypertension and diabetes. EXAM: PORTABLE CHEST 1 VIEW COMPARISON:  Radiographs 10/16/2021 FINDINGS: 2024 hours. Lower lung volumes. Allowing for this, the heart size and mediastinal contours are stable. Probable underlying diffuse fibrotic changes in the lungs with superimposed bibasilar atelectasis attributed to the lower lung volumes. No definite edema, confluent airspace opacity, significant pleural effusion or pneumothorax. Degenerative changes throughout the spine without evidence of acute osseous abnormality. Telemetry leads overlie the chest. IMPRESSION: Stable cardiomegaly and chronic interstitial prominence attributed to underlying fibrosis. Allowing for suboptimal inspiration on the current study, no definite acute superimposed abnormality. Electronically Signed   By: Richardean Sale M.D.   On: 06/05/2022 12:14   DG Foot Complete Right  Result Date: 05/18/2022 CLINICAL DATA:  Initial encounter for trauma and pain. EXAM: RIGHT FOOT COMPLETE - 3+ VIEW COMPARISON:  None Available. FINDINGS: Osteopenia. Degenerative changes including about the midfoot, first metatarsophalangeal joint. Small Achilles and calcaneal spurs. Minimally displaced, oblique longitudinal fracture involving the proximal portion of the proximal phalanx of the first digit. Involvement of the first MTP joint. IMPRESSION: 1. Proximal phalangeal fracture of the first digit with first MTP joint involvement. 2. Osteopenia with degenerative changes. Electronically Signed   By: Abigail Miyamoto M.D.   On: 05/18/2022 12:21    Disposition: Discharge disposition: 01-Home or Self Care          Follow-up Information     Hollace Hayward, NP Follow up on 06/16/2022.   Why: 9:30am Contact information: [place]. [place]  [id number] [phone]                  Signed: Nicolette Bang 06/07/2022, 11:56 AM

## 2022-06-08 ENCOUNTER — Inpatient Hospital Stay (HOSPITAL_COMMUNITY): Payer: Medicare Other

## 2022-06-08 DIAGNOSIS — R410 Disorientation, unspecified: Secondary | ICD-10-CM

## 2022-06-08 DIAGNOSIS — N186 End stage renal disease: Secondary | ICD-10-CM

## 2022-06-08 DIAGNOSIS — J189 Pneumonia, unspecified organism: Secondary | ICD-10-CM

## 2022-06-08 DIAGNOSIS — A4102 Sepsis due to Methicillin resistant Staphylococcus aureus: Secondary | ICD-10-CM | POA: Diagnosis not present

## 2022-06-08 LAB — HEPATIC FUNCTION PANEL
ALT: 57 U/L — ABNORMAL HIGH (ref 0–44)
AST: 300 U/L — ABNORMAL HIGH (ref 15–41)
Albumin: 2.2 g/dL — ABNORMAL LOW (ref 3.5–5.0)
Alkaline Phosphatase: 81 U/L (ref 38–126)
Bilirubin, Direct: 0.4 mg/dL — ABNORMAL HIGH (ref 0.0–0.2)
Indirect Bilirubin: 0.6 mg/dL (ref 0.3–0.9)
Total Bilirubin: 1 mg/dL (ref 0.3–1.2)
Total Protein: 5.6 g/dL — ABNORMAL LOW (ref 6.5–8.1)

## 2022-06-08 LAB — GLUCOSE, CAPILLARY
Glucose-Capillary: 101 mg/dL — ABNORMAL HIGH (ref 70–99)
Glucose-Capillary: 107 mg/dL — ABNORMAL HIGH (ref 70–99)
Glucose-Capillary: 140 mg/dL — ABNORMAL HIGH (ref 70–99)
Glucose-Capillary: 79 mg/dL (ref 70–99)
Glucose-Capillary: 90 mg/dL (ref 70–99)

## 2022-06-08 LAB — RENAL FUNCTION PANEL
Albumin: 2.3 g/dL — ABNORMAL LOW (ref 3.5–5.0)
Anion gap: 11 (ref 5–15)
BUN: 11 mg/dL (ref 8–23)
CO2: 30 mmol/L (ref 22–32)
Calcium: 7.9 mg/dL — ABNORMAL LOW (ref 8.9–10.3)
Chloride: 97 mmol/L — ABNORMAL LOW (ref 98–111)
Creatinine, Ser: 2.71 mg/dL — ABNORMAL HIGH (ref 0.44–1.00)
GFR, Estimated: 17 mL/min — ABNORMAL LOW (ref >60)
Glucose, Bld: 81 mg/dL (ref 70–99)
Phosphorus: 3 mg/dL (ref 2.5–4.6)
Potassium: 3.3 mmol/L — ABNORMAL LOW (ref 3.5–5.1)
Sodium: 138 mmol/L (ref 135–145)

## 2022-06-08 LAB — CBC
HCT: 25.9 % — ABNORMAL LOW (ref 36.0–46.0)
Hemoglobin: 8.1 g/dL — ABNORMAL LOW (ref 12.0–15.0)
MCH: 30.9 pg (ref 26.0–34.0)
MCHC: 31.3 g/dL (ref 30.0–36.0)
MCV: 98.9 fL (ref 80.0–100.0)
Platelets: 102 10*3/uL — ABNORMAL LOW (ref 150–400)
RBC: 2.62 MIL/uL — ABNORMAL LOW (ref 3.87–5.11)
RDW: 18.6 % — ABNORMAL HIGH (ref 11.5–15.5)
WBC: 6.4 10*3/uL (ref 4.0–10.5)
nRBC: 0.8 % — ABNORMAL HIGH (ref 0.0–0.2)

## 2022-06-08 MED ORDER — CHLORHEXIDINE GLUCONATE CLOTH 2 % EX PADS: 6.0000 | MEDICATED_PAD | Freq: Every day | CUTANEOUS | Status: DC

## 2022-06-08 MED ORDER — RENA-VITE PO TABS
1.0000 | ORAL_TABLET | Freq: Every day | ORAL | Status: DC
  Administered 2022-06-08 – 2022-06-10 (×3): 1 via ORAL
  Filled 2022-06-08 (×3): qty 1

## 2022-06-08 MED ORDER — LOPERAMIDE HCL 2 MG PO CAPS
2.0000 mg | ORAL_CAPSULE | ORAL | Status: DC | PRN
  Administered 2022-06-08 (×2): 2 mg via ORAL
  Filled 2022-06-08 (×2): qty 1

## 2022-06-08 MED ORDER — ORAL CARE MOUTH RINSE
15.0000 mL | OROMUCOSAL | Status: DC
  Administered 2022-06-08 – 2022-06-11 (×7): 15 mL via OROMUCOSAL

## 2022-06-08 MED ORDER — NEPRO/CARBSTEADY PO LIQD
237.0000 mL | Freq: Three times a day (TID) | ORAL | Status: DC
  Administered 2022-06-08 – 2022-06-10 (×6): 237 mL via ORAL

## 2022-06-08 MED ORDER — POTASSIUM CHLORIDE CRYS ER 20 MEQ PO TBCR
20.0000 meq | EXTENDED_RELEASE_TABLET | Freq: Once | ORAL | Status: AC
  Administered 2022-06-08: 20 meq via ORAL
  Filled 2022-06-08: qty 1

## 2022-06-08 MED ORDER — ORAL CARE MOUTH RINSE: 15.0000 mL | OROMUCOSAL | Status: DC | PRN

## 2022-06-08 NOTE — Progress Notes (Signed)
I came by see this patient at lunch today.  She seems to be doing much better, she is alert and oriented and starting to feel better.  She has no complaints of pain.  She is not feeling short of breath.  She denies any nausea.  The patient has had several bowel movements this morning.  She is also voiding on her own. Dialysis yesterday. Thoracentesis yesterday. Negative head CT overnight  Vitals:   06/08/22 0900 06/08/22 1000 06/08/22 1100 06/08/22 1129  BP:  (!) 116/46 (!) 122/41   Pulse: 90  86   Resp: (!) 28 (!) 22 (!) 23   Temp:    98.8 F (37.1 C)  TempSrc:    Oral  SpO2: 100%  100%   Weight:      Height:       NAD Abdomen is soft Incisions are c/d/I Wound in left groin is healing with secondary intent  Imp: Improving encephalopathy.  Treating for pneumonia.  On scheduled dialysis for end-stage renal failure.  Healing from her recent hysterectomy/sacrocolpopexy.  Wound in the left groin, likely ruptured cyst, is healing nicely.  Recommendations: At this point there are no additional recommendations from urology.  The wound in the left groin can be packed wet-to-dry daily.  Otherwise, will defer to medicine for the remainder of her care.  Appreciate the great care she has been receiving.  I did order the patient to start physical therapy today.  We will continue to follow, please contact urology for any additional questions or concerns.

## 2022-06-08 NOTE — Evaluation (Signed)
Occupational Therapy Evaluation Patient Details Name: Krista Gordon MRN: [id number] DOB: Jan 01, 1941 Today's Date: 06/08/2022   History of Present Illness 81 y.o. F admitted on 06/05/22 due to AMS. Pt recently underwent robotic assisted laparoscopic hysterectomy, bilateral salpingo-oopherectomy, and sacrocolpopexy for pelvic organ prolapse on 8/18 PMH: DM2, HTN, ESRD, and anemia   Clinical Impression   Pt admitted for concerns listed above. PTA pt reported that she was independent with all ADL's and IADL's, using no AD. At this time, pt presents with increased weakness, balance deficits, and cognitive deficits. She is requiring up to mod A for ADL's and min A for functional mobility with a RW. She is presenting with poor awareness and slow processing. Recommending HHOT if family can provide 24/7 assist. OT will continue to follow acutely.       Recommendations for follow up therapy are one component of a multi-disciplinary discharge planning process, led by the attending physician.  Recommendations may be updated based on patient status, additional functional criteria and insurance authorization.   Follow Up Recommendations  Home health OT    Assistance Recommended at Discharge Frequent or constant Supervision/Assistance  Patient can return home with the following A little help with walking and/or transfers;A lot of help with bathing/dressing/bathroom;Direct supervision/assist for medications management;Assistance with cooking/housework    Functional Status Assessment  Patient has had a recent decline in their functional status and demonstrates the ability to make significant improvements in function in a reasonable and predictable amount of time.  Equipment Recommendations  BSC/3in1;Tub/shower seat    Recommendations for Other Services       Precautions / Restrictions Precautions Precautions: Fall Restrictions Weight Bearing Restrictions: No      Mobility Bed Mobility Overal  bed mobility: Needs Assistance Bed Mobility: Supine to Sit     Supine to sit: Mod assist     General bed mobility comments: Mod A to pull to sitting and bring BLE to EOB    Transfers Overall transfer level: Needs assistance Equipment used: Rolling walker (2 wheels) Transfers: Sit to/from Stand Sit to Stand: Min assist           General transfer comment: Min A to power up and steady      Balance Overall balance assessment: Mild deficits observed, not formally tested                                         ADL either performed or assessed with clinical judgement   ADL Overall ADL's : Needs assistance/impaired Eating/Feeding: Set up;Sitting   Grooming: Min guard;Standing   Upper Body Bathing: Min guard;Sitting   Lower Body Bathing: Moderate assistance;Sitting/lateral leans;Sit to/from stand   Upper Body Dressing : Min guard;Sitting   Lower Body Dressing: Moderate assistance;Sit to/from stand;Sitting/lateral leans   Toilet Transfer: Minimal assistance;Ambulation   Toileting- Clothing Manipulation and Hygiene: Moderate assistance;Sitting/lateral lean;Sit to/from stand       Functional mobility during ADLs: Minimal assistance;Rolling walker (2 wheels) General ADL Comments: Pt with poor insight and requiring increased assist for all tasks     Vision Baseline Vision/History: 1 Wears glasses Ability to See in Adequate Light: 0 Adequate Patient Visual Report: No change from baseline Vision Assessment?: No apparent visual deficits     Perception     Praxis      Pertinent Vitals/Pain Pain Assessment Pain Assessment: No/denies pain     Hand Dominance Right  Extremity/Trunk Assessment Upper Extremity Assessment Upper Extremity Assessment: Generalized weakness   Lower Extremity Assessment Lower Extremity Assessment: Defer to PT evaluation   Cervical / Trunk Assessment Cervical / Trunk Assessment: Kyphotic   Communication  Communication Communication: HOH   Cognition Arousal/Alertness: Awake/alert Behavior During Therapy: Flat affect Overall Cognitive Status: Impaired/Different from baseline Area of Impairment: Memory, Following commands, Safety/judgement, Awareness, Problem solving                     Memory: Decreased short-term memory Following Commands: Follows one step commands with increased time Safety/Judgement: Decreased awareness of safety, Decreased awareness of deficits Awareness: Intellectual Problem Solving: Slow processing, Requires verbal cues General Comments: Requiring increased time, and verbal cuing, unaware of BM, sticking her hands in it to check.     General Comments  VSS on RA    Exercises     Shoulder Instructions      Home Living Family/patient expects to be discharged to:: Private residence Living Arrangements: Children Available Help at Discharge: Family;Available 24 hours/day Type of Home: House Home Access: Stairs to enter [employer] of Steps: 4 Entrance Stairs-Rails: None Home Layout: One level     Bathroom Shower/Tub: Teacher, early years/pre: Standard     Home Equipment: Conservation officer, nature (2 wheels)          Prior Functioning/Environment Prior Level of Function : Independent/Modified Independent                        OT Problem List: Decreased strength;Decreased activity tolerance;Impaired balance (sitting and/or standing);Decreased coordination;Decreased cognition;Decreased safety awareness;Impaired UE functional use;Cardiopulmonary status limiting activity      OT Treatment/Interventions: Self-care/ADL training;Therapeutic exercise;Energy conservation;DME and/or AE instruction;Therapeutic activities;Patient/family education;Cognitive remediation/compensation;Balance training    OT Goals(Current goals can be found in the care plan section) Acute Rehab OT Goals Patient Stated Goal: To go home OT Goal  Formulation: With patient Time For Goal Achievement: 06/22/22 Potential to Achieve Goals: Good ADL Goals Pt Will Perform Grooming: with modified independence;standing Pt Will Perform Lower Body Bathing: with modified independence;sitting/lateral leans;sit to/from stand Pt Will Perform Lower Body Dressing: with modified independence;sitting/lateral leans;sit to/from stand Pt Will Transfer to Toilet: with modified independence;ambulating Pt Will Perform Toileting - Clothing Manipulation and hygiene: with modified independence;sitting/lateral leans;sit to/from stand  OT Frequency: Min 2X/week    Co-evaluation PT/OT/SLP Co-Evaluation/Treatment: Yes Reason for Co-Treatment: For patient/therapist safety;To address functional/ADL transfers   OT goals addressed during session: ADL's and self-care      AM-PAC OT "6 Clicks" Daily Activity     Outcome Measure Help from another person eating meals?: A Little Help from another person taking care of personal grooming?: A Little Help from another person toileting, which includes using toliet, bedpan, or urinal?: A Lot Help from another person bathing (including washing, rinsing, drying)?: A Lot Help from another person to put on and taking off regular upper body clothing?: A Little Help from another person to put on and taking off regular lower body clothing?: A Lot 6 Click Score: 15   End of Session Equipment Utilized During Treatment: Gait belt;Rolling walker (2 wheels) Nurse Communication: Mobility status  Activity Tolerance: Patient tolerated treatment well Patient left: in chair;with call bell/phone within reach;with chair alarm set;with family/visitor present  OT Visit Diagnosis: Unsteadiness on feet (R26.81);Other abnormalities of gait and mobility (R26.89);Muscle weakness (generalized) (M62.81)                Time: 0998-3382  OT Time Calculation (min): 38 min Charges:  OT General Charges $OT Visit: 1 Visit OT Evaluation $OT Eval  Moderate Complexity: 1 Mod OT Treatments $Self Care/Home Management : 8-22 mins  Paulita Fujita, OTR/L  Krista Gordon 06/08/2022, 6:58 PM

## 2022-06-08 NOTE — Progress Notes (Addendum)
[facility] Progress Note  Subjective: in good spirits. No c/os today.   Vitals:   06/08/22 1200 06/08/22 1300 06/08/22 1400 06/08/22 1500  BP: (!) 133/53 (!) 123/53 119/79 136/62  Pulse: 80 81 90 86  Resp: (!) 22 (!) 26 11 (!) 22  Temp:      TempSrc:      SpO2: 100% 100% 100% 100%  Weight:      Height:        Exam: Gen alert, no distress No jvd or bruits Chest clear bilat to bases RRR no MRG Abd soft ntnd obese, clean wounds from recent lap surgery Ext no LE or UE edema Neuro as above AVF +bruit    Home meds include -amlodipine 5, atorvastatin, colchicine, estradiol, famotidine, lisniopril 40 qd, renavite, proair hfa, sodium bicarbonate 2 bid, tramadol, prns/ vits/ supps      UA rbc >50, wbc > 50, 0-5 epis, prot 100    CT abd pelvis > new bibasilar consolidation R > L and new R > L pleural effusions, some underlying fibrotic changes as well    OP HD: TTS [facility] in [place], [facility]  3h 2mn  59kg 350 bfr  Heparin 600u bolus  + 400u /hr   AVF - gets mircera 50 ug q 4 wks, not sure last but was not last week     Assessment/ Plan: Severe sepsis - likely asp PNA and/ or UTI. CT neg for intra-abd process. BCx neg, cont IV abx. Pt is improving daily.  AMS - resolved ESRD - usual HD on TTS. Had HD here Tuesday and Wednesday. Vol / labs are good. Next HD Fri and short HD Saturday to get back on schedule.  AHRF/ aspiration PNA - getting IV abx HTN/ volume - BP's up a bit, home acei and norvasc restarted. Added hold orders. Wts are up 5- 6 kg. Will start pulling fluid w/ HD tomorrow and Sat.  PAF - new onset, per pmd MBD ckd - CCa and phos in range. No binders for now.  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 Get fe / tibc.  DM - on insulin, per pmd SP recent lower abd surgery 8/18 - underwent lap hysterectomy, bilateral salpingo-oopherectomy, and sacrocolpopexy by urology    RKelly Splinter8/24/2023, 3:43  PM   Recent Labs  Lab 06/07/22 0208 06/08/22 0647 06/08/22 0648  HGB 8.1*  --  8.1*  ALBUMIN 2.4*  2.5* 2.3* 2.2*  CALCIUM 7.9* 7.9*  --   PHOS 3.7 3.0  --   CREATININE 3.69* 2.71*  --   K 3.7 3.3*  --     No results for input(s): "IRON", "TIBC", "FERRITIN" in the last 168 hours. Inpatient medications:  amLODipine  5 mg Oral Daily   Chlorhexidine Gluconate Cloth  6 each Topical Q0600   [START ON 06/13/2022] darbepoetin (ARANESP) injection - DIALYSIS  60 mcg Intravenous Q Tue-HD   feeding supplement (NEPRO CARB STEADY)  237 mL Oral TID BM   heparin  5,000 Units Subcutaneous Q8H   lisinopril  20 mg Oral Daily   multivitamin  1 tablet Oral QHS   potassium chloride  20 mEq Oral Once    sodium chloride 10 mL/hr at 06/08/22 1400   piperacillin-tazobactam (ZOSYN)  IV Stopped (06/08/22 1343)   sodium chloride, acetaminophen, heparin, heparin, loperamide, white petrolatum

## 2022-06-08 NOTE — Progress Notes (Signed)
Upon 0400 assessment patient was disoriented to person. Elink notified, orders given for a STAT head CT.

## 2022-06-08 NOTE — Progress Notes (Signed)
[facility] Progress Note Patient Name: Krista Gordon DOB: 02/20/1941 MRN: [id number]   Date of Service  06/08/2022  HPI/Events of Note  Nursing reports a change in mental status - Patient unable to tell nurse her name.   eICU Interventions  Plan: Head CT Scan without contrast STAT.     Intervention Category Major Interventions: Change in mental status - evaluation and management  [name] Cornelia Copa 06/08/2022, 3:38 AM

## 2022-06-08 NOTE — Evaluation (Addendum)
Physical Therapy Evaluation Patient Details Name: Krista Gordon MRN: [id number] DOB: 23-Jul-1941 Today's Date: 06/08/2022  History of Present Illness  81 y.o. F admitted on 06/05/22 due to AMS. Pt recently underwent robotic assisted laparoscopic hysterectomy, bilateral salpingo-oopherectomy, and sacrocolpopexy for pelvic organ prolapse on 8/18 PMH: DM2, HTN, ESRD, and anemia  Clinical Impression  Pt admitted with/for AMS.  Pt not at baseline functioning, needing min to mod assist for basic mobility and gait.  Pt currently limited functionally due to the problems listed below.  (see problems list.)  Pt will benefit from PT to maximize function and safety to be able to get home safely with available assist.        Recommendations for follow up therapy are one component of a multi-disciplinary discharge planning process, led by the attending physician.  Recommendations may be updated based on patient status, additional functional criteria and insurance authorization.  Follow Up Recommendations Home health PT      Assistance Recommended at Discharge Frequent or constant Supervision/Assistance  Patient can return home with the following  A little help with walking and/or transfers;A little help with bathing/dressing/bathroom;Assistance with cooking/housework;Direct supervision/assist for medications management;Direct supervision/assist for financial management;Assist for transportation;Help with stairs or ramp for entrance    Equipment Recommendations BSC/3in1  Recommendations for Other Services       Functional Status Assessment Patient has had a recent decline in their functional status and demonstrates the ability to make significant improvements in function in a reasonable and predictable amount of time.     Precautions / Restrictions Precautions Precautions: Fall Restrictions Weight Bearing Restrictions: No      Mobility  Bed Mobility Overal bed mobility: Needs Assistance Bed  Mobility: Supine to Sit     Supine to sit: Mod assist     General bed mobility comments: Mod A to pull to sitting and bring BLE to EOB    Transfers Overall transfer level: Needs assistance Equipment used: Rolling walker (2 wheels) Transfers: Sit to/from Stand Sit to Stand: Min assist           General transfer comment: Min A to power up and steady    Ambulation/Gait Ambulation/Gait assistance: Min assist Gait Distance (Feet): 15 Feet (x2) Assistive device: Rolling walker (2 wheels) Gait Pattern/deviations: Step-to pattern, Step-through pattern, Decreased step length - right, Decreased step length - left, Decreased stride length   Gait velocity interpretation: <1.8 ft/sec, indicate of risk for recurrent falls   General Gait Details: mildly unsteady, staggery steps,  Stairs            Wheelchair Mobility    Modified Rankin (Stroke Patients Only)       Balance Overall balance assessment: Mild deficits observed, not formally tested, Needs assistance Sitting-balance support: Single extremity supported, No upper extremity supported, Feet supported Sitting balance-Leahy Scale: Fair     Standing balance support: Single extremity supported, No upper extremity supported, During functional activity Standing balance-Leahy Scale: Fair Standing balance comment: stood multiple minute in bathroom on peri-care and manipulating her gown with line management with min with/without railing for stability.                             Pertinent Vitals/Pain Pain Assessment Pain Assessment: No/denies pain    Home Living Family/patient expects to be discharged to:: Private residence Living Arrangements: Children Available Help at Discharge: Family;Available 24 hours/day Type of Home: House Home Access: Stairs to enter Entrance Stairs-Rails: None  Entrance Stairs-Number of Steps: 4   Home Layout: One level Home Equipment: Conservation officer, nature (2 wheels)      Prior  Function Prior Level of Function : Independent/Modified Independent                     Hand Dominance   Dominant Hand: Right    Extremity/Trunk Assessment   Upper Extremity Assessment Upper Extremity Assessment: Generalized weakness    Lower Extremity Assessment Lower Extremity Assessment: Generalized weakness    Cervical / Trunk Assessment Cervical / Trunk Assessment: Kyphotic  Communication   Communication: HOH  Cognition Arousal/Alertness: Awake/alert Behavior During Therapy: Flat affect Overall Cognitive Status: Impaired/Different from baseline Area of Impairment: Memory, Following commands, Safety/judgement, Awareness, Problem solving                     Memory: Decreased short-term memory Following Commands: Follows one step commands with increased time Safety/Judgement: Decreased awareness of safety, Decreased awareness of deficits Awareness: Intellectual Problem Solving: Slow processing, Requires verbal cues General Comments: Requiring increased time, and verbal cuing, unaware of BM, sticking her hands in it to check.        General Comments General comments (skin integrity, edema, etc.): VSS on RA    Exercises     Assessment/Plan    PT Assessment Patient needs continued PT services  PT Problem List Decreased strength;Decreased activity tolerance;Decreased balance;Decreased mobility;Decreased safety awareness       PT Treatment Interventions DME instruction;Gait training;Functional mobility training;Therapeutic activities;Balance training;Patient/family education    PT Goals (Current goals can be found in the Care Plan section)  Acute Rehab PT Goals PT Goal Formulation: Patient unable to participate in goal setting Time For Goal Achievement: 06/22/22 Potential to Achieve Goals: Good    Frequency Min 3X/week     Co-evaluation PT/OT/SLP Co-Evaluation/Treatment: Yes Reason for Co-Treatment: For patient/therapist safety PT goals  addressed during session: Mobility/safety with mobility OT goals addressed during session: ADL's and self-care       AM-PAC PT "6 Clicks" Mobility  Outcome Measure Help needed turning from your back to your side while in a flat bed without using bedrails?: A Little Help needed moving from lying on your back to sitting on the side of a flat bed without using bedrails?: A Lot Help needed moving to and from a bed to a chair (including a wheelchair)?: A Little Help needed standing up from a chair using your arms (e.g., wheelchair or bedside chair)?: A Little Help needed to walk in hospital room?: A Little Help needed climbing 3-5 steps with a railing? : A Lot 6 Click Score: 16    End of Session   Activity Tolerance: Patient tolerated treatment well Patient left: in chair;with call bell/phone within reach;with chair alarm set;with family/visitor present Nurse Communication: Mobility status PT Visit Diagnosis: Other abnormalities of gait and mobility (R26.89);Muscle weakness (generalized) (M62.81);Difficulty in walking, not elsewhere classified (R26.2)    Time: 6378-5885 PT Time Calculation (min) (ACUTE ONLY): 38 min   Charges:   PT Evaluation $PT Eval Moderate Complexity: 1 Mod          06/08/2022  Ginger Carne., PT Acute Rehabilitation Services [phone]  (pager) [phone]  (office)  Tessie Fass Tanasha Menees 06/08/2022, 7:15 PM

## 2022-06-08 NOTE — Progress Notes (Signed)
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  Patient recently underwent robotic assisted laparoscopic hysterectomy, bilateral salpingo-oopherectomy, and sacrocolpopexy for pelvic organ prolapse on 8/18, discharged home on 8/19 with tramadol.  Family report she wasn't at her baseline when she was discharged, was confused, but they were trying to get her home for her scheduled dialysis on Saturday.  However, she was only able to tolerate one hour due to hypotension.  Since, family report ongoing weakness, lethargy, poor PO intake, with patient primarily sleeping since.  At baseline, family reports patient is fully dependent in her ADLs, drives, and still works as Neurosurgeon.    Found by EMS with SBP 79, CBG 64, room air saturation 79%, and responsive to verbal/ painful stimuli. O2 improved with 3L O2 and given glucagon.  IV access only able to be obtained in ER.  Was afebrile with improved SBP in the 90's.  Given 1.5L LR.  Labs noted for WBC 16.1, H/H 7.8/ 26.6 (10.1/ 35.7> 8.2/ 28.9 on discharge 8/19), plts 137, K 5.8, bicarb 18, BUN 42, sCr 6.8, alk phos 138, albumin 3.1, AST 126, INR 1.7, UA with large Hgb, RBC> 50, leukocytes, WBC> 50.  EKG noted afib, rate 90's (afib new for patient).  CXR noted for low lung volumes, stable cardiomegaly, chronic interstitial prominence attributed to underlying fibrosis.  CT head negative for acute intracranial abnormality.  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  Blood and urine cultures sent and started on empiric antibiotics.    Assessment & Plan:   Principal Problem:   Sepsis ([place])   Severe sepsis, - likely related to aspiration pneumonia, possible UTI  - and can not fully rule out intra-abdominal process but less likely. Bilateral pleural effusions with lower lob consolidation on CT A/P. S/p 1.5L in ER.  Patient afebrile overnight, HDS w/ MAP > 65. Bcx and Ucx NGTD, WBC 8.3 (10.6), 734 cc intake, 145 cc out. 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  Is not on home O2. -She was seen by SLP, advance to dysphagia 1 with liquids -Weaned to room air today. -Encouraged to use incentive spirometry and flutter valve    ESRD  -HD per renal  Hyperkalemia -Resolved  AGMA/ lactic acidosis  - resolved   Acute metabolic/ septic encephalopathy/hospital delirium CTH neg. AMS improved today, following some commands, but distractible, only oriented to self, place and year. EEG showed no seizure like activity, but was positive for encephalopathy. Ammonia level normal, patient also received dialysis yesterday. Patient also received narcan yesterday with no change in mental status, with appropriate CBG's making hypoglycemia unlikely.  -He had an episode of  confusion overnight, CT head obtained, with no acute findings     Lone A-fib/new onset  Afib POA, but resolved shortly after. CHA2DS2-VASc score 5.  -Patient with no recurrent events over last 24 hours, continue to monitor on telemetry, if she had recurrent A-fib then would consider anticoagulation.          Anemia of chronic disease + mild component of ABLA - baseline Hgb 9-10.  Hgb 10.1 pre-op down to 8.2 after surgery.  Today Hgb 8.1 (7.1) improved from yesterday, no signs or symptoms of bleeding.   -Continue to monitor/ trend.   -Transfuse for Hgb< 7     Mild Transaminitis  CT a/p noted chronic extrahepatic biliary dilatation post cholecystectomy, likely physiologic. Alk phos normal, AST rise to 665 (420), ALT rise 90 (47). RUQ US showed chronic common bile duct dilation, likely 2/2 cholecystectomy, no signs of stones or blockage. Hep B and Hep C screen negative.  -Due to shock liver, trending down     Coagulopathy INR 1.7 on admission. Will recheck today -F/u INR   Chronic thrombocytopenia No current evidence of bleeding. Plt drop to 106 (121) today. Reconsider DVT ppx if Plt < 70.   - Continue to monitor  - trend plts on CBC     Hx of DM, current A1c suggest no longer diabetic  Hypoglycemia, resolved, likely related to NPO/ sepsis. CBG range 73-118. - trend CBG q 4, - hypoglycemia protocol prn.     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 Louis Meckel 8/18. CT a/p with changes c/w post surgery. Wounds healing with no sign of infection.   - Trend clinical abd exams.  -Urology input appreciated     DVT prophylaxis: [place] heparin Code Status: Full Family Communication: D/W sister at bedside Disposition:   Status is: Inpatient    Consultants:  Renal PCCM urology  Subjective:  Patient herself denies any complaints, but per staff she had episode of confusion for which CT head was obtained, with no acute findings.  Objective: Vitals:   06/08/22 0530 06/08/22 0600 06/08/22 0630 06/08/22 0754  BP: (!) 145/68 122/63 (!) 157/58   Pulse: 80 73 78   Resp: (!) 23 17 (!) 30   Temp:    97.7 F (36.5 C)  TempSrc:    Oral  SpO2: 100% 100% 100%   Weight:      Height:        Intake/Output  Summary (Last 24 hours) at 06/08/2022 1101 Last data filed at 06/08/2022 0800 Gross per 24 hour  Intake 406.14 ml  Output 400 ml  Net 6.14 ml   Filed Weights   06/07/22 1515 06/07/22 1854 06/08/22 0354  Weight: 66.8 kg 65.9 kg 66.4 kg    Examination:  Awake Alert, appears more appropriate and conversant today, frail, deconditioned and weak Symmetrical Chest wall movement, diminished air entry at the bases RRR,No Gallops,Rubs or new Murmurs, No Parasternal Heave +ve B.Sounds, Abd Soft, No tenderness, No rebound - guarding or rigidity. No Cyanosis, Clubbing or edema, No new Rash or bruise      Data Reviewed: I have personally reviewed following labs and imaging studies  CBC: Recent Labs  Lab 06/02/22 0615 06/03/22 0530 06/05/22 1347 06/05/22 2232 06/06/22 0451 06/07/22 0208 06/08/22 0648  WBC 6.2  --  16.1*  --  10.6* 8.3 6.4  NEUTROABS 3.5  --  13.8*  --   --   --   --   HGB 10.1*   < >  7.8* 8.8* 7.1* 8.1* 8.1*  HCT 35.7*   < > 26.6* 26.0* 23.5* 26.8* 25.9*  MCV 104.7*  --  106.4*  --  101.7* 101.5* 98.9  PLT 121*  --  137*  --  121* 106* 102*   < > = values in this interval not displayed.    Basic Metabolic Panel: Recent Labs  Lab 06/05/22 2328 06/06/22 0115 06/06/22 0452 06/07/22 0208 06/08/22 0647  NA 143 139 142 134* 138  K 7.2* 5.9* 4.7 3.7 3.3*  CL 104 101 99 96* 97*  CO2 21* $Remov'25 29 29 30  'TZIcFf$ GLUCOSE 123* 131* 103* 91 81  BUN 48* 45* 43* 18 11  CREATININE 6.51* 6.26* 6.19* 3.69* 2.71*  CALCIUM 8.6* 7.6* 7.6* 7.9* 7.9*  PHOS  --   --  5.2* 3.7 3.0    GFR: Estimated Creatinine Clearance: 15 mL/min (A) (by C-G formula based on SCr of 2.71 mg/dL (H)).  Liver Function Tests: Recent Labs  Lab 06/02/22 0615 06/05/22 1255 06/06/22 0451 06/06/22 0452 06/07/22 0208 06/08/22 0647 06/08/22 0648  AST 21 126* 420*  --  665*  --  300*  ALT 10 19 47*  --  90*  --  57*  ALKPHOS 82 138* 96  --  88  --  81  BILITOT 0.7 <0.1* 0.5  --  0.9  --  1.0  PROT 7.0  6.9 5.5*  --  5.7*  --  5.6*  ALBUMIN 3.2* 3.1* 2.5* 2.5* 2.4*  2.5* 2.3* 2.2*    CBG: Recent Labs  Lab 06/07/22 1525 06/07/22 1918 06/07/22 2327 06/08/22 0322 06/08/22 0756  GLUCAP 92 82 96 90 79     Recent Results (from the past 240 hour(s))  Blood Culture (routine x 2)     Status: None (Preliminary result)   Collection Time: 06/05/22 12:25 PM   Specimen: BLOOD RIGHT ARM  Result Value Ref Range Status   Specimen Description   Final    BLOOD RIGHT ARM UPPER Performed at [facility], [place] [address], [place], [place] [address]    Special Requests   Final    BOTTLES DRAWN AEROBIC AND ANAEROBIC Blood Culture adequate volume Performed at [facility] [address] [place], [place] [address]    Culture   Final    NO GROWTH 3 DAYS Performed at [facility], [place] [address], [place], [place] [address]    Report Status PENDING  Incomplete  Urine Culture     Status: None   Collection Time: 06/05/22  1:19 PM   Specimen: In/Out Cath Urine  Result Value Ref Range Status   Specimen Description   Final    IN/OUT CATH URINE Performed at [facility] [address] [place], [place] [address]    Special Requests   Final    NONE Performed at [facility], [place] [address], [place], [place] [address]    Culture   Final    NO GROWTH Performed at [facility], [place] [address], [place], [place] [address]    Report Status 06/06/2022 FINAL  Final  MRSA Next Gen by PCR, Nasal     Status: None   Collection Time: 06/05/22  8:25 PM   Specimen: Nasal Mucosa; Nasal Swab  Result Value Ref Range Status   MRSA by PCR Next Gen NOT DETECTED NOT DETECTED Final    Comment: (NOTE) The GeneXpert MRSA Assay (FDA approved for NASAL specimens only), is  one component of a comprehensive MRSA colonization surveillance program. It is not intended to diagnose MRSA infection nor to guide or monitor treatment for MRSA  infections. Test performance is not FDA approved in patients less than 67 years old. Performed at [facility], [place] [address] [place], [place] [address]   Body fluid culture w Gram Stain     Status: None (Preliminary result)   Collection Time: 06/07/22 11:19 AM   Specimen: Pleural Fluid  Result Value Ref Range Status   Specimen Description PLEURAL  Final   Special Requests NONE  Final   Gram Stain   Final    WBC PRESENT,BOTH PMN AND MONONUCLEAR NO ORGANISMS SEEN CYTOSPIN SMEAR    Culture   Final    NO GROWTH < 24 HOURS Performed at [facility], [place] [address] [place], [place] [address]    Report Status PENDING  Incomplete         Radiology Studies: CT HEAD WO CONTRAST (5MM)  Result Date: 06/08/2022 CLINICAL DATA:  81 year old female with altered mental status. EXAM: CT HEAD WITHOUT CONTRAST TECHNIQUE: Contiguous axial images were obtained from the base of the skull through the vertex without intravenous contrast. RADIATION DOSE REDUCTION: This exam was performed according to the departmental dose-optimization program which includes automated exposure control, adjustment of the mA and/or kV according to patient size and/or use of iterative reconstruction technique. COMPARISON:  Head CT 06/05/2022. FINDINGS: Brain: Cerebral volume is within normal limits for age. No midline shift, ventriculomegaly, mass effect, evidence of mass lesion, intracranial hemorrhage or evidence of cortically based acute infarction. Patchy bilateral white matter hypodensity appears stable. Elsewhere gray-white differentiation appears normal for age. No cortical encephalomalacia identified. Vascular: Mild Calcified atherosclerosis at the skull base. No suspicious intracranial vascular hyperdensity. Skull: No acute osseous abnormality identified. Sinuses/Orbits: Visualized paranasal sinuses and mastoids are clear. Other: Similar Disconjugate gaze. No acute orbit or scalp soft tissue finding.  IMPRESSION: No acute intracranial abnormality. 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 Electronically Signed   By: Genevie Ann M.D.   On: 06/08/2022 04:24   DG Swallowing Func-Speech Pathology  Result Date: 06/07/2022 Table formatting from the original result was not included. Images from the original result were not included. Objective Swallowing Evaluation: Type of Study: MBS-Modified Barium Swallow Study  Patient Details Name: OSA FOGARTY MRN: [id number] Date of Birth: [birth year] Today's Date: 06/07/2022 Time: SLP Start Time (ACUTE ONLY): 1340 -SLP Stop Time (ACUTE ONLY): 1400 SLP Time Calculation (min) (ACUTE ONLY): 20 min Past Medical History: Past Medical History: Diagnosis Date  Anemia   COVID   patient states had COVID "over a month ago and in hospital 3 days"  Diabetes mellitus without complication ([place])   ESRD on hemodialysis ([place])   kidney disease  Gall stone   Gout   Hyperlipidemia   Hypertension  Past Surgical History: Past Surgical History: Procedure Laterality Date  A/V FISTULAGRAM Left 12/05/2021  Procedure: A/V Fistulagram;  Surgeon: Algernon Huxley, MD;  Location: [facility];  Service: Cardiovascular;  Laterality: Left;  A/V SHUNTOGRAM Left 02/16/2020  Procedure: A/V SHUNTOGRAM;  Surgeon: Algernon Huxley, MD;  Location: [facility];  Service: Cardiovascular;  Laterality: Left;  AV FISTULA PLACEMENT Left 01/15/2020  Procedure: INSERTION OF ARTERIOVENOUS (AV) GORE-TEX GRAFT ARM;  Surgeon: Algernon Huxley, MD;  Location: [facility];  Service: Vascular;  Laterality: Left;  COLONOSCOPY  WITH PROPOFOL N/A 01/27/2016  Procedure: COLONOSCOPY WITH PROPOFOL;  Surgeon: Hulen Luster, MD;  Location: [facility] END[facility];  Service: Gastroenterology;  Laterality: N/A;  ROBOTIC ASSISTED LAPAROSCOPIC SACROCOLPOPEXY Bilateral 06/02/2022  Procedure: XI ROBOTIC ASSISTED LAPAROSCOPIC SACROCOLPOPEXY SUPRACEVICAL HYSTERECTOMY AND BILATERAL SALPINGO  OOPHERECTOMY;  Surgeon: Ardis Hughs, MD;  Location: [facility];  Service: Urology;  Laterality: Bilateral;  4.5 HRS FOR THIS CASE  TUBAL LIGATION   HPI: 81 year old female with PMH as below, significant for 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  Family report she wasn't at her baseline when she was discharged, was confused, but they were trying to get her home for her scheduled dialysis on Saturday.  However, she was only able to tolerate one hour due to hypotension.  Since, family report ongoing weakness, lethargy, poor PO intake, with patient primarily sleeping since.  At baseline, family reports patient is fully dependent in her ADLs, drives, and still works as Neurosurgeon.  No data recorded  Recommendations for follow up therapy are one component of a multi-disciplinary discharge planning process, led by the attending physician.  Recommendations may be updated based on patient status, additional functional criteria and insurance authorization. Assessment / Plan / Recommendation   06/07/2022   2:16 PM Clinical Impressions Clinical Impression Pt demonstrates mild oropharyngeal impiarment. Oral manipulation of puree and mastication and bolus formation of regular solids is prolonged and laborious. Pt has dentures but no denture paste which may be impacting effective chewing. She is also confused. 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 Pt has to swallow 2-3 time per bolus  due to this strucural change. No aspiration occurred. Esophageal sweep showed barium tablet hesitate in the distal esophagus, but it passed with further liquid swallows. Pt is recommended to consume a puree diet with thin liquids. SLP will f/u to advance diet next week if mentation has improved. SLP Visit Diagnosis Dysphagia, unspecified (R13.10) Impact on safety and function Mild aspiration risk     06/07/2022   2:16 PM Treatment Recommendations Treatment Recommendations Therapy as outlined in treatment plan below     06/07/2022   2:16 PM Prognosis Prognosis for Safe Diet Advancement Good Barriers to Reach Goals Cognitive deficits   06/07/2022   2:16 PM Diet Recommendations SLP Diet Recommendations Dysphagia 1 (Puree) solids;Thin liquid Liquid Administration via Cup;Straw Medication Administration Whole meds with liquid Compensations Slow rate;Small sips/bites     06/07/2022   2:16 PM Other Recommendations Follow Up Recommendations Skilled nursing-short term rehab (<3 hours/day)   06/07/2022   2:16 PM Frequency and Duration  Speech Therapy Frequency (ACUTE ONLY) min 2x/week Treatment Duration 2 weeks     06/07/2022   2:16 PM Oral Phase Oral Phase Impaired Oral - Thin Cup WFL Oral - Thin Straw WFL Oral - Puree Reduced posterior propulsion;Decreased bolus cohesion;Delayed oral transit Oral - Regular Reduced posterior propulsion;Impaired mastication;Decreased bolus cohesion;Delayed oral transit Oral - Pill [facility]    06/07/2022   2:16 PM Pharyngeal Phase Pharyngeal Phase Impaired Pharyngeal- Thin Cup Reduced epiglottic inversion;Delayed swallow initiation-pyriform sinuses;Delayed swallow initiation-vallecula Pharyngeal- Thin  Straw Reduced epiglottic inversion;Delayed swallow initiation-pyriform sinuses;Delayed swallow initiation-vallecula Pharyngeal- Puree Reduced epiglottic inversion;Delayed swallow initiation-vallecula Pharyngeal- Regular Reduced epiglottic inversion;Delayed swallow initiation-vallecula Pharyngeal- Pill  Reduced epiglottic inversion;Delayed swallow initiation-pyriform sinuses     No data to display    DeBlois, Katherene Ponto 06/07/2022, 2:57 PM                     DG CHEST PORT 1 VIEW  Result Date: 06/07/2022 CLINICAL DATA:  Status post right thoracentesis EXAM: PORTABLE CHEST 1 VIEW COMPARISON:  Chest x-ray dated June 07, 2022 FINDINGS: Cardiac and mediastinal contours are unchanged. Trace right pleural effusion, decreased in size when compared with prior exam. Stable trace left pleural effusion. No evidence of pneumothorax. Bibasilar atelectasis. Unchanged bilateral reticular opacities, likely due to underlying interstitial lung disease. IMPRESSION: No evidence of pneumothorax status post thoracentesis. Electronically Signed   By: Yetta Glassman M.D.   On: 06/07/2022 13:28   DG CHEST PORT 1 VIEW  Result Date: 06/07/2022 CLINICAL DATA:  Shortness of the breath common pneumonia. EXAM: PORTABLE CHEST 1 VIEW COMPARISON:  Chest radiograph June 06, 2022 FINDINGS: The heart size and mediastinal contours are partially obscured but appear unchanged. Interval progression of the bilateral right-greater-than-left interstitial and airspace opacities. Stable small right with new probable trace left pleural effusions. No acute osseous abnormality. IMPRESSION: Progression of right-greater-than-left interstitial and airspace opacities with a stable small right and probable new left pleural effusion, most consistent with worsening pneumonia. Electronically Signed   By: Dahlia Bailiff M.D.   On: 06/07/2022 08:35   ECHOCARDIOGRAM COMPLETE  Result Date: 06/06/2022    ECHOCARDIOGRAM REPORT   Patient Name:   Krista Gordon Date of Exam: 06/06/2022 Medical Rec #:  [id number]        Height:       62.0 in Accession #:    [id number]       Weight:       142.6 lb Date of Birth:  03-11-1941        BSA:          1.656 m Patient Age:    40 years         BP:           102/55 mmHg Patient Gender: F                HR:           77  bpm. Exam Location:  Inpatient Procedure: 2D Echo, Cardiac Doppler, Color Doppler and Intracardiac            Opacification Agent Indications:    Cardiomyopathy  History:        Patient has no prior history of Echocardiogram examinations.                 Risk Factors:Hypertension, Diabetes and Dyslipidemia.  Sonographer:    Jefferey Pica Referring Phys: [id number] [place]  1. Left ventricular ejection fraction, by estimation, is 50 to 55%. The left ventricle has low normal function. The left ventricle demonstrates global hypokinesis. Left ventricular diastolic parameters are consistent with Grade I diastolic dysfunction (impaired relaxation). There is the interventricular septum is flattened in diastole ('D' shaped left ventricle), consistent with right ventricular volume overload.  2. Right ventricular systolic function is mildly reduced. The right ventricular size is mildly enlarged. There is severely elevated pulmonary artery systolic pressure. The estimated right ventricular systolic pressure is 98.3 mmHg.  3. Right atrial size was severely  dilated.  4. The mitral valve is normal in structure. No evidence of mitral valve regurgitation.  5. Tricuspid valve regurgitation is moderate.  6. The aortic valve is tricuspid. Aortic valve regurgitation is not visualized.  7. The inferior vena cava is dilated in size with <50% respiratory variability, suggesting right atrial pressure of 15 mmHg. Comparison(s): No prior Echocardiogram. Conclusion(s)/Recommendation(s): Significantly elevated PASP. FINDINGS  Left Ventricle: Left ventricular ejection fraction, by estimation, is 50 to 55%. The left ventricle has low normal function. The left ventricle demonstrates global hypokinesis. The left ventricular internal cavity size was normal in size. There is no left ventricular hypertrophy. The interventricular septum is flattened in diastole ('D' shaped left ventricle), consistent with right ventricular volume  overload. Left ventricular diastolic parameters are consistent with Grade I diastolic dysfunction (impaired relaxation). Right Ventricle: The right ventricular size is mildly enlarged. Right ventricular systolic function is mildly reduced. There is severely elevated pulmonary artery systolic pressure. The tricuspid regurgitant velocity is 3.55 m/s, and with an assumed right atrial pressure of 15 mmHg, the estimated right ventricular systolic pressure is 16.3 mmHg. Left Atrium: Left atrial size was normal in size. Right Atrium: Right atrial size was severely dilated. Pericardium: There is no evidence of pericardial effusion. Mitral Valve: The mitral valve is normal in structure. There is mild thickening of the mitral valve leaflet(s). No evidence of mitral valve regurgitation. Tricuspid Valve: Tricuspid valve regurgitation is moderate. Aortic Valve: The aortic valve is tricuspid. Aortic valve regurgitation is not visualized. Aortic valve peak gradient measures 7.7 mmHg. Pulmonic Valve: The pulmonic valve was normal in structure. Pulmonic valve regurgitation is mild. Aorta: The aortic root and ascending aorta are structurally normal, with no evidence of dilitation. Venous: The inferior vena cava is dilated in size with less than 50% respiratory variability, suggesting right atrial pressure of 15 mmHg.  LEFT VENTRICLE PLAX 2D LVIDd:         4.60 cm   Diastology LVIDs:         3.25 cm   LV e' medial:    6.25 cm/s LV PW:         1.00 cm   LV E/e' medial:  8.3 LV IVS:        0.80 cm   LV e' lateral:   11.10 cm/s LVOT diam:     2.00 cm   LV E/e' lateral: 4.7 LV SV:         64 LV SV Index:   39 LVOT Area:     3.14 cm  IVC IVC diam: 2.60 cm LEFT ATRIUM           Index        RIGHT ATRIUM           Index LA diam:      3.50 cm 2.11 cm/m   RA Area:     26.90 cm LA Vol (A2C): 47.1 ml 28.44 ml/m  RA Volume:   113.00 ml 68.24 ml/m LA Vol (A4C): 38.9 ml 23.49 ml/m  AORTIC VALVE                 PULMONIC VALVE AV Area (Vmax):  2.58 cm     PV Vmax:       0.72 m/s AV Vmax:        139.00 cm/s  PV Peak grad:  2.1 mmHg AV Peak Grad:   7.7 mmHg LVOT Vmax:      114.00 cm/s LVOT Vmean:     62.000 cm/s  LVOT VTI:       0.204 m  AORTA Ao Root diam: 3.10 cm Ao Asc diam:  2.90 cm MITRAL VALVE               TRICUSPID VALVE MV Area (PHT): 2.81 cm    TR Peak grad:   50.4 mmHg MV Decel Time: 270 msec    TR Vmax:        355.00 cm/s MV E velocity: 52.10 cm/s MV A velocity: 78.00 cm/s  SHUNTS MV E/A ratio:  0.67        Systemic VTI:  0.20 m                            Systemic Diam: 2.00 cm Phineas Inches Electronically signed by Phineas Inches Signature Date/Time: 06/06/2022/3:24:56 PM    Final    US Abdomen Limited RUQ (LIVER/GB)  Result Date: 06/06/2022 CLINICAL DATA:  Transaminitis. EXAM: ULTRASOUND ABDOMEN LIMITED RIGHT UPPER QUADRANT COMPARISON:  CT abdomen pelvis 06/05/2022 FINDINGS: Gallbladder: Surgically removed Common bile duct: Diameter: 10 mm, unchanged from the prior CT. Also no change from CT of 08/03/2021. Likely post cholecystectomy dilatation. Liver: No focal lesion identified. Within normal limits in parenchymal echogenicity. Portal vein is patent on color Doppler imaging with normal direction of blood flow towards the liver. Other: None IMPRESSION: Postop cholecystectomy. Chronic common bile duct dilatation 10 mm likely due to cholecystectomy. Electronically Signed   By: Franchot Gallo M.D.   On: 06/06/2022 13:49   EEG adult  Result Date: 06/06/2022 Lora Havens, MD     06/06/2022 12:06 PM Patient Name: Krista Gordon MRN: [id number] Epilepsy Attending: Lora Havens Referring Physician/Provider: Holley Bouche, MD Date: 06/06/2022 Duration: 22.53 mins Patient history: 81 year old female with altered mental status.  EEG to evaluate  for seizure. Level of alertness: Awake AEDs during EEG study: None Technical aspects: This EEG study was done with scalp electrodes positioned according to the 10-20 International system of electrode  placement. Electrical activity was reviewed with band pass filter of 1-'70Hz'$ , sensitivity of 7 uV/mm, display speed of 51m/sec with a '60Hz'$  notched filter applied as appropriate. EEG data were recorded continuously and digitally stored.  Video monitoring was available and reviewed as appropriate. Description: No clear posterior dominant rhythm was seen. EEG showed continuous generalized 3 to 6 Hz theta-delta slowing. Hyperventilation and photic stimulation were not performed.   ABNORMALITY - Continuous slow, generalized IMPRESSION: This study is suggestive of moderate diffuse encephalopathy, nonspecific etiology. No seizures or epileptiform discharges were seen throughout the recording. Priyanka OBarbra Sarks       Scheduled Meds:  amLODipine  5 mg Oral Daily   Chlorhexidine Gluconate Cloth  6 each Topical Q0600   [START ON 06/13/2022] darbepoetin (ARANESP) injection - DIALYSIS  60 mcg Intravenous Q Tue-HD   heparin  5,000 Units Subcutaneous Q8H   lisinopril  20 mg Oral Daily   Continuous Infusions:  sodium chloride 10 mL/hr at 06/08/22 0800   piperacillin-tazobactam (ZOSYN)  IV Stopped (06/08/22 05974     LOS: 3 days      DPhillips Climes MD [facility]   To contact the attending provider between 7A-7P or the covering provider during after hours 7P-7A, please log into the web site [email or web address] and access using universal [place] password for that web site. If you do not have the password, please call the hospital operator.  06/08/2022, 11:01 AM

## 2022-06-09 DIAGNOSIS — N171 Acute kidney failure with acute cortical necrosis: Secondary | ICD-10-CM | POA: Diagnosis not present

## 2022-06-09 DIAGNOSIS — A4102 Sepsis due to Methicillin resistant Staphylococcus aureus: Secondary | ICD-10-CM | POA: Diagnosis not present

## 2022-06-09 DIAGNOSIS — R6520 Severe sepsis without septic shock: Secondary | ICD-10-CM | POA: Diagnosis not present

## 2022-06-09 LAB — CBC
HCT: 27.2 % — ABNORMAL LOW (ref 36.0–46.0)
Hemoglobin: 8.4 g/dL — ABNORMAL LOW (ref 12.0–15.0)
MCH: 30.4 pg (ref 26.0–34.0)
MCHC: 30.9 g/dL (ref 30.0–36.0)
MCV: 98.6 fL (ref 80.0–100.0)
Platelets: 103 10*3/uL — ABNORMAL LOW (ref 150–400)
RBC: 2.76 MIL/uL — ABNORMAL LOW (ref 3.87–5.11)
RDW: 19.5 % — ABNORMAL HIGH (ref 11.5–15.5)
WBC: 7 10*3/uL (ref 4.0–10.5)
nRBC: 0.6 % — ABNORMAL HIGH (ref 0.0–0.2)

## 2022-06-09 LAB — HEPATIC FUNCTION PANEL
ALT: 41 U/L (ref 0–44)
AST: 177 U/L — ABNORMAL HIGH (ref 15–41)
Albumin: 2.4 g/dL — ABNORMAL LOW (ref 3.5–5.0)
Alkaline Phosphatase: 74 U/L (ref 38–126)
Bilirubin, Direct: 0.3 mg/dL — ABNORMAL HIGH (ref 0.0–0.2)
Indirect Bilirubin: 0.7 mg/dL (ref 0.3–0.9)
Total Bilirubin: 1 mg/dL (ref 0.3–1.2)
Total Protein: 5.7 g/dL — ABNORMAL LOW (ref 6.5–8.1)

## 2022-06-09 LAB — BASIC METABOLIC PANEL
Anion gap: 12 (ref 5–15)
BUN: 18 mg/dL (ref 8–23)
CO2: 27 mmol/L (ref 22–32)
Calcium: 8.1 mg/dL — ABNORMAL LOW (ref 8.9–10.3)
Chloride: 96 mmol/L — ABNORMAL LOW (ref 98–111)
Creatinine, Ser: 3.55 mg/dL — ABNORMAL HIGH (ref 0.44–1.00)
GFR, Estimated: 13 mL/min — ABNORMAL LOW (ref >60)
Glucose, Bld: 90 mg/dL (ref 70–99)
Potassium: 3.3 mmol/L — ABNORMAL LOW (ref 3.5–5.1)
Sodium: 135 mmol/L (ref 135–145)

## 2022-06-09 LAB — GLUCOSE, CAPILLARY
Glucose-Capillary: 82 mg/dL (ref 70–99)
Glucose-Capillary: 91 mg/dL (ref 70–99)
Glucose-Capillary: 127 mg/dL — ABNORMAL HIGH (ref 70–99)
Glucose-Capillary: 119 mg/dL — ABNORMAL HIGH (ref 70–99)
Glucose-Capillary: 81 mg/dL (ref 70–99)

## 2022-06-09 LAB — CYTOLOGY - NON PAP

## 2022-06-09 MED ORDER — CHLORHEXIDINE GLUCONATE CLOTH 2 % EX PADS: 6.0000 | MEDICATED_PAD | Freq: Every day | CUTANEOUS | Status: DC

## 2022-06-09 MED ORDER — HEPARIN SODIUM (PORCINE) 1000 UNIT/ML DIALYSIS: 2000.0000 [IU] | INTRAMUSCULAR | Status: DC | PRN

## 2022-06-09 NOTE — Progress Notes (Signed)
Physical Therapy Treatment Patient Details Name: Krista Gordon MRN: [id number] DOB: 27-Feb-1941 Today's Date: 06/09/2022   History of Present Illness 81 y.o. F admitted on 06/05/22 due to AMS. Pt recently underwent robotic assisted laparoscopic hysterectomy, bilateral salpingo-oopherectomy, and sacrocolpopexy for pelvic organ prolapse on 8/18 PMH: DM2, HTN, ESRD, and anemia    PT Comments    Pt needed to use the BSC immediately on arrival.  Pt seem clearer cognitively.  Emphasis on transitions, transfers to [facility], standing activity including hygiene and progression of gait in the halls with the RW working on scanning, changes in speed and turns.    Recommendations for follow up therapy are one component of a multi-disciplinary discharge planning process, led by the attending physician.  Recommendations may be updated based on patient status, additional functional criteria and insurance authorization.  Follow Up Recommendations  Home health PT     Assistance Recommended at Discharge Frequent or constant Supervision/Assistance  Patient can return home with the following A little help with walking and/or transfers;A little help with bathing/dressing/bathroom;Assistance with cooking/housework;Direct supervision/assist for medications management;Direct supervision/assist for financial management;Assist for transportation;Help with stairs or ramp for entrance   Equipment Recommendations  BSC/3in1    Recommendations for Other Services       Precautions / Restrictions Precautions Precautions: Fall     Mobility  Bed Mobility Overal bed mobility: Needs Assistance Bed Mobility: Supine to Sit     Supine to sit: Min assist     General bed mobility comments: slow, mild struggle to EOB with elevated HOB and minimal use of the rail    Transfers Overall transfer level: Needs assistance   Transfers: Sit to/from Stand Sit to Stand: Min assist           General transfer comment: bil  UE's to standing, stability assist    Ambulation/Gait Ambulation/Gait assistance: Min guard Gait Distance (Feet): 160 Feet Assistive device: Rolling walker (2 wheels) Gait Pattern/deviations: Step-through pattern, Decreased step length - right, Decreased step length - left, Decreased stride length   Gait velocity interpretation: <1.31 ft/sec, indicative of household ambulator   General Gait Details: mildly unsteady, but improved when not in confined space in the room.  Pt able to scan and change speeds without deviation, but turns produce mild deviation that pt could self-recover from.   Stairs             Wheelchair Mobility    Modified Rankin (Stroke Patients Only)       Balance Overall balance assessment: Needs assistance Sitting-balance support: Single extremity supported, No upper extremity supported, Feet supported Sitting balance-Leahy Scale: Fair       Standing balance-Leahy Scale: Fair Standing balance comment: prefered holding to something stationary for hygiene at [place]: Awake/alert Behavior During Therapy: Flat affect Overall Cognitive Status:  (NT formally, improved attention, following commands today)                                          Exercises      General Comments General comments (skin integrity, edema, etc.): VSS on RA, but O2 replaced at end of session      Pertinent Vitals/Pain Pain Assessment Pain Assessment: Faces Faces Pain Scale: No hurt Pain  Intervention(s): Monitored during session    Home Living                          Prior Function            PT Goals (current goals can now be found in the care plan section) Acute Rehab PT Goals PT Goal Formulation: Patient unable to participate in goal setting Time For Goal Achievement: 06/22/22 Potential to Achieve Goals: Good Progress towards PT goals: Progressing toward goals     Frequency    Min 3X/week      PT Plan Current plan remains appropriate    Co-evaluation              AM-PAC PT "6 Clicks" Mobility   Outcome Measure  Help needed turning from your back to your side while in a flat bed without using bedrails?: A Little Help needed moving from lying on your back to sitting on the side of a flat bed without using bedrails?: A Little Help needed moving to and from a bed to a chair (including a wheelchair)?: A Little Help needed standing up from a chair using your arms (e.g., wheelchair or bedside chair)?: A Little Help needed to walk in hospital room?: A Little Help needed climbing 3-5 steps with a railing? : A Lot 6 Click Score: 17    End of Session Equipment Utilized During Treatment: Oxygen Activity Tolerance: Patient tolerated treatment well Patient left: in bed;with call bell/phone within reach;with family/visitor present Nurse Communication: Mobility status PT Visit Diagnosis: Other abnormalities of gait and mobility (R26.89);Muscle weakness (generalized) (M62.81);Difficulty in walking, not elsewhere classified (R26.2)     Time: 9622-2979 PT Time Calculation (min) (ACUTE ONLY): 25 min  Charges:  $Gait Training: 8-22 mins $Therapeutic Activity: 8-22 mins                     06/09/2022  Ginger Carne., PT Acute Rehabilitation Services [phone]  (pager) [phone]  (office)   [name] 06/09/2022, 1:53 PM

## 2022-06-09 NOTE — TOC Initial Note (Addendum)
Transition of Care [facility]) - Initial/Assessment Note    Patient Details  Name: Krista Gordon MRN: [id number] Date of Birth: 09-23-1941  Transition of Care [facility]) CM/SW Contact:    Tom-Johnson, Renea Ee, RN Phone Number: 06/09/2022, 2:40 PM  Clinical Narrative:                  CM spoke with patient at bedside about needs for post hospital transition. Admitted for Sepsis. IV abx and O2 @ 2L acute. Had hx of ESRD and has TTS outpatient dialysis scheduled days. From home with her daughter, Reginold Agent. Has three supportive children. Sister, Romie Minus at bedside.  Has a cane and shower seat at home. PT recommending bsc and tub bench. Order called in to [place] and will be delivered to patient at bedside.  PCP is Marguerita Merles, MD [facility] [place].  Home health PT/OT referral called in to [place] per patient's request and Claiborne Billings voiced acceptance. Info on AVS.  Daughter to transport at discharge.CM will continue to follow as patient progresses with care.   Expected Discharge Plan: [place] Barriers to Discharge: Continued Medical Work up   Patient Goals and CMS Choice Patient states their goals for this hospitalization and ongoing recovery are:: To return home CMS Medicare.gov Compare Post Acute Care list provided to:: Patient Choice offered to / list presented to : Patient  Expected Discharge Plan and Services Expected Discharge Plan: [place]   Discharge Planning Services: CM Consult Post Acute Care Choice: [place] arrangements for the past 2 months: Single Family Home                 DME Arranged: 3-N-1, Tub bench DME Agency: [employer] Date DME Agency Contacted: 06/09/22 Time DME Agency Contacted: 29 Representative spoke with at DME Agency: Jodell Cipro HH Arranged: PT, OT HH Agency: [place] Date [place]: 06/09/22 Time [place]: 29 Representative spoke  with at [place]: Claiborne Billings  Prior Living Arrangements/Services Living arrangements for the past 2 months: [place] with:: Adult Children (Daughter) Patient language and need for interpreter reviewed:: Yes Do you feel safe going back to the place where you live?: Yes      Need for Family Participation in Patient Care: Yes (Comment) Care giver support system in place?: Yes (comment) Current home services: DME (Cane, shower seat.) Criminal Activity/Legal Involvement Pertinent to Current Situation/Hospitalization: No - Comment as needed  Activities of Daily Living Home Assistive Devices/Equipment: None ADL Screening (condition at time of admission) Patient's cognitive ability adequate to safely complete daily activities?: No Is the patient deaf or have difficulty hearing?: No Does the patient have difficulty seeing, even when wearing glasses/contacts?: No Does the patient have difficulty concentrating, remembering, or making decisions?: Yes Patient able to express need for assistance with ADLs?: No Does the patient have difficulty dressing or bathing?: Yes Independently performs ADLs?: No Communication: Needs assistance Is this a change from baseline?: Change from baseline, expected to last >3 days Dressing (OT): Needs assistance Is this a change from baseline?: Change from baseline, expected to last >3 days Grooming: Needs assistance Is this a change from baseline?: Change from baseline, expected to last >3 days Feeding: Needs assistance Is this a change from baseline?: Change from baseline, expected to last >3 days Bathing: Needs assistance Is this a change from baseline?: Change from baseline, expected to last >3 days Toileting: Needs assistance Is  this a change from baseline?: Change from baseline, expected to last >3days In/Out Bed: Needs assistance Is this a change from baseline?: Change from baseline, expected to last >3 days Walks in Home: Needs assistance Is  this a change from baseline?: Change from baseline, expected to last >3 days Does the patient have difficulty walking or climbing stairs?: Yes Weakness of Legs: Both Weakness of Arms/Hands: Both  Permission Sought/Granted Permission sought to share information with : Case Manager, Customer service manager, Family Supports Permission granted to share information with : Yes, Verbal Permission Granted              Emotional Assessment Appearance:: Appears stated age Attitude/Demeanor/Rapport: Engaged, Gracious Affect (typically observed): Accepting, Appropriate, Calm, Hopeful, Pleasant Orientation: : Oriented to Self, Oriented to Place, Oriented to  Time, Oriented to Situation Alcohol / Substance Use: Not Applicable Psych Involvement: No (comment)  Admission diagnosis:  Sepsis ([place]) [A41.9] Sepsis, due to unspecified organism, unspecified whether acute organ dysfunction present [facility]) [A41.9] Patient Active Problem List   Diagnosis Date Noted   Sepsis ([place]) 06/05/2022   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   Pneumonia due to COVID-19 virus 10/16/2021   CKD (chronic kidney disease), stage V ([place]) 06/21/2018   Nephrolithiasis 06/20/2018   Chronic kidney disease, stage III (moderate) ([place]) 06/20/2018   Diabetes ([place]) 06/20/2018   Essential hypertension 06/20/2018   Hematuria 06/20/2018   HLD (hyperlipidemia) 06/20/2018   Proteinuria 06/20/2018   PCP:  Marguerita Merles, MD Pharmacy[facility]   [id number] [place] [id number]: [phone]: [phone]    Social Determinants of Health (SDOH) Interventions    Readmission Risk Interventions     No data to display

## 2022-06-09 NOTE — Progress Notes (Signed)
[facility] Progress Note  Subjective: in good spirits. No c/os today.   Vitals:   06/09/22 1000 06/09/22 1100 06/09/22 1200 06/09/22 1300  BP: (!) 140/67 (!) 149/52 (!) 145/64   Pulse: 77 73 72 90  Resp: 20 (!) 22 (!) 22 (!) 24  Temp:  97.7 F (36.5 C)    TempSrc:  Oral    SpO2: 95% 97% 95% 93%  Weight:      Height:        Exam: Gen alert, no distress No jvd or bruits Chest clear bilat to bases RRR no MRG Abd soft ntnd obese, clean wounds from recent lap surgery Ext no LE or UE edema Neuro as above AVF +bruit    Home meds include -amlodipine 5, atorvastatin, colchicine, estradiol, famotidine, lisniopril 40 qd, renavite, proair hfa, sodium bicarbonate 2 bid, tramadol, prns/ vits/ supps      UA rbc >50, wbc > 50, 0-5 epis, prot 100    CT abd pelvis > new bibasilar consolidation R > L and new R > L pleural effusions, some underlying fibrotic changes as well    OP HD: TTS [facility] in [place], [facility]  3h 79mn  59kg 350 bfr  Heparin 600u bolus  + 400u /hr   AVF - gets mircera 50 ug q 4 wks, not sure last but was not last week     Assessment/ Plan: Severe sepsis - likely asp PNA and/ or UTI. CT neg for intra-abd process. BCx neg, cont IV abx. Pt is improving daily.  AMS - resolved ESRD - usual HD on TTS. Had HD here Tuesday and Wednesday. Vol / labs are good. HD short today then resume TTS w/ HD tomorrow.  AHRF/ aspiration PNA - getting IV abx HTN/ volume - BP's up a bit, home acei and norvasc restarted. Added hold orders. Wts are up 5- 6 kg. Will start pulling fluid w/ HD tomorrow and Sat.  PAF - new onset, per pmd MBD ckd - CCa and phos in range. No binders for now.  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 Get fe / tibc.  DM - on insulin, per pmd SP recent lower abd surgery 8/18 - underwent lap hysterectomy, bilateral salpingo-oopherectomy, and sacrocolpopexy by urology    RKelly Splinter8/25/2023, 2:58  PM   Recent Labs  Lab 06/07/22 0208 06/08/22 0647 06/08/22 0648 06/09/22 0119  HGB 8.1*  --  8.1* 8.4*  ALBUMIN 2.4*  2.5* 2.3* 2.2* 2.4*  CALCIUM 7.9* 7.9*  --  8.1*  PHOS 3.7 3.0  --   --   CREATININE 3.69* 2.71*  --  3.55*  K 3.7 3.3*  --  3.3*    No results for input(s): "IRON", "TIBC", "FERRITIN" in the last 168 hours. Inpatient medications:  amLODipine  5 mg Oral Daily   Chlorhexidine Gluconate Cloth  6 each Topical Q0600   [START ON 06/13/2022] darbepoetin (ARANESP) injection - DIALYSIS  60 mcg Intravenous Q Tue-HD   feeding supplement (NEPRO CARB STEADY)  237 mL Oral TID BM   heparin  5,000 Units Subcutaneous Q8H   lisinopril  20 mg Oral Daily   multivitamin  1 tablet Oral QHS   mouth rinse  15 mL Mouth Rinse 4 times per day    sodium chloride 10 mL/hr at 06/09/22 1100   piperacillin-tazobactam (ZOSYN)  IV 2.25 g (06/09/22 1435)   sodium chloride, acetaminophen, heparin, heparin, [START ON  06/10/2022] heparin, loperamide, mouth rinse, white petrolatum

## 2022-06-09 NOTE — Plan of Care (Signed)
  Problem: Education: Goal: Knowledge of General Education information will improve Description: Including pain rating scale, medication(s)/side effects and non-pharmacologic comfort measures Outcome: Progressing   Problem: Health Behavior/Discharge Planning: Goal: Ability to manage health-related needs will improve Outcome: Progressing   Problem: Clinical Measurements: Goal: Ability to maintain clinical measurements within normal limits will improve 06/09/2022 1854 by [place] Boast, RN Outcome: Progressing 06/09/2022 1854 by [place] Boast, RN Outcome: Progressing Goal: Will remain free from infection 06/09/2022 1854 by [place] Boast, RN Outcome: Progressing 06/09/2022 1854 by [place] Boast, RN Outcome: Progressing Goal: Diagnostic test results will improve 06/09/2022 1854 by [place] Boast, RN Outcome: Progressing 06/09/2022 1854 by [place] Boast, RN Outcome: Progressing Goal: Respiratory complications will improve 06/09/2022 1854 by [place] Boast, RN Outcome: Progressing 06/09/2022 1854 by [place] Boast, RN Outcome: Progressing Goal: Cardiovascular complication will be avoided 06/09/2022 1854 by [place] Boast, RN Outcome: Progressing 06/09/2022 1854 by [place] Boast, RN Outcome: Progressing   Problem: Activity: Goal: Risk for activity intolerance will decrease Outcome: Progressing   Problem: Safety: Goal: Ability to remain free from injury will improve Outcome: Progressing   Problem: Skin Integrity: Goal: Risk for impaired skin integrity will decrease Outcome: Progressing

## 2022-06-09 NOTE — Progress Notes (Signed)
RN accompanied pt in transport to dialysis. Pt off unit to Dialysis at this time.Report given to dialysis nurse. Pt transitioned to dialysis tele monitor.

## 2022-06-09 NOTE — Progress Notes (Signed)
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  Patient recently underwent robotic assisted laparoscopic hysterectomy, bilateral salpingo-oopherectomy, and sacrocolpopexy for pelvic organ prolapse on 8/18, discharged home on 8/19 with tramadol.  Family report she wasn't at her baseline when she was discharged, was confused, but they were trying to get her home for her scheduled dialysis on Saturday.  However, she was only able to tolerate one hour due to hypotension.  Since, family report ongoing weakness, lethargy, poor PO intake, with patient primarily sleeping since.  At baseline, family reports patient is fully dependent in her ADLs, drives, and still works as Neurosurgeon.    Found by EMS with SBP 79, CBG 64, room air saturation 79%, and responsive to verbal/ painful stimuli. O2 improved with 3L O2 and given glucagon.  IV access only able to be obtained in ER.  Was afebrile with improved SBP in the 90's.  Given 1.5L LR.  Labs noted for WBC 16.1, H/H 7.8/ 26.6 (10.1/ 35.7> 8.2/ 28.9 on discharge 8/19), plts 137, K 5.8, bicarb 18, BUN 42, sCr 6.8, alk phos 138, albumin 3.1, AST 126, INR 1.7, UA with large Hgb, RBC> 50, leukocytes, WBC> 50.  EKG noted afib, rate 90's (afib new for patient).  CXR noted for low lung volumes, stable cardiomegaly, chronic interstitial prominence attributed to underlying fibrosis.  CT head negative for acute intracranial abnormality.  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  Blood and urine cultures sent and started on empiric antibiotics.    Assessment & Plan:   Principal Problem:   Sepsis ([place])   Severe sepsis, - likely related to aspiration pneumonia, possible UTI  -CT negative for intra-abdominal process - and can not fully rule out intra-abdominal process but less likely. Bilateral pleural effusions with lower lob consolidation on CT A/P. -Sepsis postthoracentesis, cultures with no growth to date. -Improving, afebrile, blood pressure has improved, blood cultures and urine cultures with no growth to date -Continue with Zosyn   Acute hypoxic and mild hypercarbic respiratory failure Aspiration pneumonia Bilateral pleural effusions, R> L Likely underlying pulmonary fibrosis, undiagnosed  - Previous imaging review, patient has fibrotic changes with slight honeycombing dating back to 2020 on CT renal studies.  Is not on home O2. -She was seen by SLP, advance to dysphagia 1 with liquids -Weaned to room air today. -Encouraged to use incentive spirometry and flutter valve    ESRD  -HD per renal  Hyperkalemia -Resolved  AGMA/ lactic acidosis  - resolved   Acute metabolic/ septic encephalopathy/hospital delirium CTH neg. AMS improved today, following some commands, but distractible, only oriented to self, place and year. EEG showed no seizure like activity, but was positive for encephalopathy. Ammonia level normal, patient also received dialysis yesterday. Patient also received narcan yesterday with no change in mental status, with appropriate CBG's making hypoglycemia unlikely.  -CT head with no acute findings   Lone A-fib/new onset  Afib  POA, but resolved shortly after. CHA2DS2-VASc score 5.  -No evidence of A-fib over last 48 hours.  if she had recurrent A-fib then would consider anticoagulation.      Anemia of chronic disease + mild component of  ABLA - baseline Hgb 9-10.  Hgb 10.1 pre-op down to 8.2 after surgery.  Today Hgb 8.1 (7.1) improved from yesterday, no signs or symptoms of bleeding.   -Continue to monitor/ trend.   -Transfuse for Hgb< 7     Mild Transaminitis  CT a/p noted chronic extrahepatic biliary dilatation post cholecystectomy, likely physiologic. Alk phos normal, AST rise to 665 (420), ALT rise 90 (47). RUQ US showed chronic common bile duct dilation, likely 2/2 cholecystectomy, no signs of stones or blockage. Hep B and Hep C screen negative.  -Due to shock liver, trending down     Coagulopathy INR 1.7 on admission. 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 Louis Meckel 8/18. CT a/p with changes c/w post surgery. Wounds healing with no sign of infection.   - Trend clinical abd exams.  -Urology input appreciated     DVT prophylaxis:  heparin Code Status: Full Family Communication: D/W sister at bedside daily Disposition: Home with home health in 1 to 2 days  Status is: Inpatient    Consultants:  Renal PCCM urology  Subjective:  She denies any complaints today, no significant events overnight patient herself denies any complaints.  Objective: Vitals:   06/09/22 0600 06/09/22 0700 06/09/22 0800 06/09/22 0900  BP: (!) 180/116 (!) 145/68 (!) 164/89 (!) 151/61  Pulse: 89 80 84 84  Resp: (!) 30 (!) 25 16 (!) 27  Temp:   98.4 F (36.9 C)   TempSrc:   Oral   SpO2: 100% 100% 100% (!) 88%  Weight:      Height:        Intake/Output Summary (Last 24 hours) at 06/09/2022 1111 Last data filed at 06/09/2022 0900 Gross per 24 hour  Intake 764.73 ml  Output --  Net 764.73 ml   Filed Weights   06/07/22 1515 06/07/22 1854 06/08/22 0354  Weight: 66.8 kg 65.9 kg 66.4 kg    Examination:  Awake Alert,  Oriented X 3, No new F.N deficits, Normal affect Symmetrical Chest wall movement, Good air movement bilaterally, CTAB RRR,No Gallops,Rubs or new Murmurs, No Parasternal Heave +ve B.Sounds, Abd Soft, No tenderness, No rebound - guarding or rigidity. No Cyanosis, Clubbing or edema, No new Rash or bruise      Data Reviewed: I have personally reviewed following labs and imaging studies  CBC: Recent Labs  Lab 06/05/22 1347 06/05/22 2232 06/06/22 0451 06/07/22 0208 06/08/22 0648 06/09/22 0119  WBC 16.1*  --  10.6* 8.3 6.4 7.0  NEUTROABS 13.8*  --   --   --   --   --   HGB 7.8* 8.8* 7.1* 8.1* 8.1* 8.4*  HCT 26.6* 26.0* 23.5* 26.8* 25.9* 27.2*  MCV 106.4*  --  101.7* 101.5* 98.9 98.6  PLT 137*  --  121* 106* 102* 103*    Basic Metabolic Panel: Recent Labs  Lab 06/06/22 0115 06/06/22 0452 06/07/22 0208 06/08/22 0647 06/09/22 0119  NA 139 142 134* 138 135  K 5.9* 4.7 3.7 3.3* 3.3*  CL 101 99 96* 97* 96*  CO2 _0 27  GLUCOSE 131* 103* 91 81 90  BUN 45* 43* _0 CREATININE 6.26* 6.19* 3.69* 2.71* 3.55*  CALCIUM 7.6* 7.6* 7.9* 7.9* 8.1*  PHOS  --  5.2* 3.7 3.0  --     GFR: Estimated Creatinine Clearance: 11.5 mL/min (A) (by C-G formula based on SCr of 3.55 mg/dL (H)).  Liver Function Tests: Recent Labs  Lab 06/05/22 1255 06/06/22 0451 06/06/22 0452 06/07/22 0208 06/08/22 0647 06/08/22 0648 06/09/22 0119  AST 126* 420*  --  665*  --  300* 177*  ALT 19 47*  --  90*  --  57* 41  ALKPHOS 138* 96  --  88  --  81 74  BILITOT <0.1* 0.5  --  0.9  --  1.0 1.0  PROT 6.9 5.5*  --  5.7*  --  5.6* 5.7*  ALBUMIN 3.1* 2.5* 2.5* 2.4*  2.5* 2.3* 2.2* 2.4*    CBG: Recent Labs  Lab 06/08/22 1127 06/08/22 1610 06/08/22 2330 06/09/22 0324 06/09/22 0816  GLUCAP 107* 140* 101* 82 91     Recent Results (from the past 240 hour(s))  Blood Culture (routine x 2)     Status: None (Preliminary result)   Collection Time: 06/05/22 12:25 PM   Specimen: BLOOD RIGHT  ARM  Result Value Ref Range Status   Specimen Description   Final    BLOOD RIGHT ARM UPPER Performed at [facility], [address], [place], [place] [address]    Special Requests   Final    BOTTLES DRAWN AEROBIC AND ANAEROBIC Blood Culture adequate volume Performed at [facility] [address], [place], [place] [address]    Culture   Final    NO GROWTH 4 DAYS Performed at [facility], [place] [address] [place], [place] [address]    Report Status PENDING  Incomplete  Urine Culture     Status: None   Collection Time: 06/05/22  1:19 PM   Specimen: In/Out Cath Urine  Result Value Ref Range Status   Specimen Description   Final    IN/OUT CATH URINE Performed at [facility] [address], [place], [place] [address]    Special Requests   Final    NONE Performed at [facility], [place] [address], [place], [place] [address]    Culture   Final    NO GROWTH Performed at [facility], [place] [address] [place], [place] [address]    Report Status 06/06/2022 FINAL  Final  MRSA Next Gen by PCR, Nasal     Status: None   Collection Time: 06/05/22  8:25 PM   Specimen: Nasal Mucosa; Nasal Swab  Result Value Ref Range Status   MRSA by PCR Next Gen NOT DETECTED NOT DETECTED Final    Comment: (NOTE) The GeneXpert MRSA Assay (FDA approved for NASAL specimens only), is one component of a comprehensive MRSA colonization surveillance program. It is not intended to diagnose MRSA infection nor to guide or monitor treatment for MRSA infections. Test performance is not FDA approved in patients less than 35 years old. Performed at [facility], [place] [address] [place], [place] [address]   Body fluid culture w Gram Stain     Status: None (Preliminary result)   Collection Time: 06/07/22 11:19 AM   Specimen: Pleural Fluid  Result Value Ref Range Status   Specimen Description PLEURAL  Final   Special Requests NONE   Final  Gram Stain   Final    WBC PRESENT,BOTH PMN AND MONONUCLEAR NO ORGANISMS SEEN CYTOSPIN SMEAR    Culture   Final    NO GROWTH < 24 HOURS Performed at [facility] [address], [place], [place] [address]    Report Status PENDING  Incomplete         Radiology Studies: CT HEAD WO CONTRAST (5MM)  Result Date: 06/08/2022 CLINICAL DATA:  81 year old female with altered mental status. EXAM: CT HEAD WITHOUT CONTRAST TECHNIQUE: Contiguous axial images were obtained from the base of the skull through the vertex without intravenous contrast. RADIATION DOSE REDUCTION: This exam was performed according to the departmental dose-optimization program which includes automated exposure control, adjustment of the mA and/or kV according to patient size and/or use of iterative reconstruction technique. COMPARISON:  Head CT 06/05/2022. FINDINGS: Brain: Cerebral volume is within normal limits for age. No midline shift, ventriculomegaly, mass effect, evidence of mass lesion, intracranial hemorrhage or evidence of cortically based acute infarction. Patchy bilateral white matter hypodensity appears stable. Elsewhere gray-white differentiation appears normal for age. No cortical encephalomalacia identified. Vascular: Mild Calcified atherosclerosis at the skull base. No suspicious intracranial vascular hyperdensity. Skull: No acute osseous abnormality identified. Sinuses/Orbits: Visualized paranasal sinuses and mastoids are clear. Other: Similar Disconjugate gaze. No acute orbit or scalp soft tissue finding. IMPRESSION: No acute intracranial abnormality. 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 Electronically Signed   By: Genevie Ann M.D.   On: 06/08/2022 04:24   DG Swallowing Func-Speech Pathology  Result Date: 06/07/2022 Table formatting from the original result was not included. Images from the original result were not included.  Objective Swallowing Evaluation: Type of Study: MBS-Modified Barium Swallow Study  Patient Details Name: Krista Gordon MRN: [id number] Date of Birth: 1941-05-19 Today's Date: 06/07/2022 Time: SLP Start Time (ACUTE ONLY): 1340 -SLP Stop Time (ACUTE ONLY): 1400 SLP Time Calculation (min) (ACUTE ONLY): 20 min Past Medical History: Past Medical History: Diagnosis Date  Anemia   COVID   patient states had COVID "over a month ago and in hospital 3 days"  Diabetes mellitus without complication ([place])   ESRD on hemodialysis ([place])   kidney disease  Gall stone   Gout   Hyperlipidemia   Hypertension  Past Surgical History: Past Surgical History: Procedure Laterality Date  A/V FISTULAGRAM Left 12/05/2021  Procedure: A/V Fistulagram;  Surgeon: Algernon Huxley, MD;  Location: [facility];  Service: Cardiovascular;  Laterality: Left;  A/V SHUNTOGRAM Left 02/16/2020  Procedure: A/V SHUNTOGRAM;  Surgeon: Algernon Huxley, MD;  Location: [facility];  Service: Cardiovascular;  Laterality: Left;  AV FISTULA PLACEMENT Left 01/15/2020  Procedure: INSERTION OF ARTERIOVENOUS (AV) GORE-TEX GRAFT ARM;  Surgeon: Algernon Huxley, MD;  Location: [facility] OR[facility];  Service: Vascular;  Laterality: Left;  COLONOSCOPY WITH PROPOFOL N/A 01/27/2016  Procedure: COLONOSCOPY WITH PROPOFOL;  Surgeon: Hulen Luster, MD;  Location: [facility];  Service: Gastroenterology;  Laterality: N/A;  ROBOTIC ASSISTED LAPAROSCOPIC SACROCOLPOPEXY Bilateral 06/02/2022  Procedure: XI ROBOTIC ASSISTED LAPAROSCOPIC SACROCOLPOPEXY SUPRACEVICAL HYSTERECTOMY AND BILATERAL SALPINGO OOPHERECTOMY;  Surgeon: Ardis Hughs, MD;  Location: [facility];  Service: Urology;  Laterality: Bilateral;  4.5 HRS FOR THIS CASE  TUBAL LIGATION   HPI: 81 year old female with PMH as below, significant for 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 Suspected aspiration pna given CT chest  with new bilateral pleural effusions with bilateral lower lobe consolidation  with evidence of underlying chronic fibrotic lung disease  Patient recently underwent robotic assisted laparoscopic hysterectomy, bilateral salpingo-oopherectomy, and sacrocolpopexy for pelvic organ prolapse on 8/18, discharged home on 8/19 with tramadol.  Family report she wasn't at her baseline when she was discharged, was confused, but they were trying to get her home for her scheduled dialysis on Saturday.  However, she was only able to tolerate one hour due to hypotension.  Since, family report ongoing weakness, lethargy, poor PO intake, with patient primarily sleeping since.  At baseline, family reports patient is fully dependent in her ADLs, drives, and still works as Neurosurgeon.  No data recorded  Recommendations for follow up therapy are one component of a multi-disciplinary discharge planning process, led by the attending physician.  Recommendations may be updated based on patient status, additional functional criteria and insurance authorization. Assessment / Plan / Recommendation   06/07/2022   2:16 PM Clinical Impressions Clinical Impression Pt demonstrates mild oropharyngeal impiarment. Oral manipulation of puree and mastication and bolus formation of regular solids is prolonged and laborious. Pt has dentures but no denture paste which may be impacting effective chewing. She is also confused. 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 Pt has to swallow 2-3 time per bolus due to this strucural change. No aspiration occurred. Esophageal sweep showed barium tablet hesitate in the distal esophagus, but it passed with further liquid swallows. Pt is recommended to consume a puree diet with thin liquids. SLP will f/u to advance diet next week if mentation has improved. SLP Visit Diagnosis Dysphagia, unspecified (R13.10) Impact on safety and function Mild aspiration risk      06/07/2022   2:16 PM Treatment Recommendations Treatment Recommendations Therapy as outlined in treatment plan below     06/07/2022   2:16 PM Prognosis Prognosis for Safe Diet Advancement Good Barriers to Reach Goals Cognitive deficits   06/07/2022   2:16 PM Diet Recommendations SLP Diet Recommendations Dysphagia 1 (Puree) solids;Thin liquid Liquid Administration via Cup;Straw Medication Administration Whole meds with liquid Compensations Slow rate;Small sips/bites     06/07/2022   2:16 PM Other Recommendations Follow Up Recommendations Skilled nursing-short term rehab (<3 hours/day)   06/07/2022   2:16 PM Frequency and Duration  Speech Therapy Frequency (ACUTE ONLY) min 2x/week Treatment Duration 2 weeks     06/07/2022   2:16 PM Oral Phase Oral Phase Impaired Oral - Thin Cup WFL Oral - Thin Straw WFL Oral - Puree Reduced posterior propulsion;Decreased bolus cohesion;Delayed oral transit Oral - Regular Reduced posterior propulsion;Impaired mastication;Decreased bolus cohesion;Delayed oral transit Oral - Pill [facility]    06/07/2022   2:16 PM Pharyngeal Phase Pharyngeal Phase Impaired Pharyngeal- Thin Cup Reduced epiglottic inversion;Delayed swallow initiation-pyriform sinuses;Delayed swallow initiation-vallecula Pharyngeal- Thin Straw Reduced epiglottic inversion;Delayed swallow initiation-pyriform sinuses;Delayed swallow initiation-vallecula Pharyngeal- Puree Reduced epiglottic inversion;Delayed swallow initiation-vallecula Pharyngeal- Regular Reduced epiglottic inversion;Delayed swallow initiation-vallecula Pharyngeal- Pill Reduced epiglottic inversion;Delayed swallow initiation-pyriform sinuses     No data to display    DeBlois, Katherene Ponto 06/07/2022, 2:57 PM                     DG CHEST PORT 1 VIEW  Result Date: 06/07/2022 CLINICAL DATA:  Status post right thoracentesis EXAM: PORTABLE CHEST 1 VIEW COMPARISON:  Chest x-ray dated June 07, 2022 FINDINGS: Cardiac and mediastinal contours are  unchanged. Trace right  pleural effusion, decreased in size when compared with prior exam. Stable trace left pleural effusion. No evidence of pneumothorax. Bibasilar atelectasis. Unchanged bilateral reticular opacities, likely due to underlying interstitial lung disease. IMPRESSION: No evidence of pneumothorax status post thoracentesis. Electronically Signed   By: Yetta Glassman M.D.   On: 06/07/2022 13:28        Scheduled Meds:  amLODipine  5 mg Oral Daily   Chlorhexidine Gluconate Cloth  6 each Topical Q0600   [START ON 06/13/2022] darbepoetin (ARANESP) injection - DIALYSIS  60 mcg Intravenous Q Tue-HD   feeding supplement (NEPRO CARB STEADY)  237 mL Oral TID BM   heparin  5,000 Units Subcutaneous Q8H   lisinopril  20 mg Oral Daily   multivitamin  1 tablet Oral QHS   mouth rinse  15 mL Mouth Rinse 4 times per day   Continuous Infusions:  sodium chloride 10 mL/hr at 06/09/22 0900   piperacillin-tazobactam (ZOSYN)  IV Stopped (06/09/22 0609)     LOS: 4 days      Phillips Climes, MD [facility]   To contact the attending provider between 7A-7P or the covering provider during after hours 7P-7A, please log into the web site [email or web address] and access using universal [place] password for that web site. If you do not have the password, please call the hospital operator.  06/09/2022, 11:11 AM

## 2022-06-10 DIAGNOSIS — R6520 Severe sepsis without septic shock: Secondary | ICD-10-CM | POA: Diagnosis not present

## 2022-06-10 DIAGNOSIS — N171 Acute kidney failure with acute cortical necrosis: Secondary | ICD-10-CM | POA: Diagnosis not present

## 2022-06-10 DIAGNOSIS — A4102 Sepsis due to Methicillin resistant Staphylococcus aureus: Secondary | ICD-10-CM | POA: Diagnosis not present

## 2022-06-10 LAB — BASIC METABOLIC PANEL
Anion gap: 9 (ref 5–15)
BUN: 12 mg/dL (ref 8–23)
CO2: 30 mmol/L (ref 22–32)
Calcium: 8.1 mg/dL — ABNORMAL LOW (ref 8.9–10.3)
Chloride: 99 mmol/L (ref 98–111)
Creatinine, Ser: 2.71 mg/dL — ABNORMAL HIGH (ref 0.44–1.00)
GFR, Estimated: 17 mL/min — ABNORMAL LOW (ref >60)
Glucose, Bld: 87 mg/dL (ref 70–99)
Potassium: 3.4 mmol/L — ABNORMAL LOW (ref 3.5–5.1)
Sodium: 138 mmol/L (ref 135–145)

## 2022-06-10 LAB — CULTURE, BLOOD (ROUTINE X 2)
Culture: NO GROWTH
Special Requests: ADEQUATE

## 2022-06-10 LAB — CBC
HCT: 28.7 % — ABNORMAL LOW (ref 36.0–46.0)
Hemoglobin: 8.8 g/dL — ABNORMAL LOW (ref 12.0–15.0)
MCH: 30.9 pg (ref 26.0–34.0)
MCHC: 30.7 g/dL (ref 30.0–36.0)
MCV: 100.7 fL — ABNORMAL HIGH (ref 80.0–100.0)
Platelets: 105 10*3/uL — ABNORMAL LOW (ref 150–400)
RBC: 2.85 MIL/uL — ABNORMAL LOW (ref 3.87–5.11)
RDW: 19.9 % — ABNORMAL HIGH (ref 11.5–15.5)
WBC: 7.5 10*3/uL (ref 4.0–10.5)
nRBC: 0 % (ref 0.0–0.2)

## 2022-06-10 LAB — GLUCOSE, CAPILLARY
Glucose-Capillary: 61 mg/dL — ABNORMAL LOW (ref 70–99)
Glucose-Capillary: 98 mg/dL (ref 70–99)
Glucose-Capillary: 51 mg/dL — ABNORMAL LOW (ref 70–99)
Glucose-Capillary: 73 mg/dL (ref 70–99)
Glucose-Capillary: 92 mg/dL (ref 70–99)
Glucose-Capillary: 61 mg/dL — ABNORMAL LOW (ref 70–99)
Glucose-Capillary: 88 mg/dL (ref 70–99)
Glucose-Capillary: 80 mg/dL (ref 70–99)

## 2022-06-10 LAB — HEPATIC FUNCTION PANEL
ALT: 29 U/L (ref 0–44)
AST: 96 U/L — ABNORMAL HIGH (ref 15–41)
Albumin: 2.2 g/dL — ABNORMAL LOW (ref 3.5–5.0)
Alkaline Phosphatase: 74 U/L (ref 38–126)
Bilirubin, Direct: 0.3 mg/dL — ABNORMAL HIGH (ref 0.0–0.2)
Indirect Bilirubin: 0.6 mg/dL (ref 0.3–0.9)
Total Bilirubin: 0.9 mg/dL (ref 0.3–1.2)
Total Protein: 5.7 g/dL — ABNORMAL LOW (ref 6.5–8.1)

## 2022-06-10 LAB — BODY FLUID CULTURE W GRAM STAIN: Culture: NO GROWTH

## 2022-06-10 MED ORDER — AMLODIPINE BESYLATE 5 MG PO TABS
2.5000 mg | ORAL_TABLET | Freq: Two times a day (BID) | ORAL | Status: DC
  Administered 2022-06-11: 2.5 mg via ORAL
  Filled 2022-06-10: qty 1

## 2022-06-10 MED ORDER — CHLORHEXIDINE GLUCONATE CLOTH 2 % EX PADS
6.0000 | MEDICATED_PAD | Freq: Every day | CUTANEOUS | Status: DC
  Administered 2022-06-10 – 2022-06-11 (×2): 6 via TOPICAL

## 2022-06-10 MED ORDER — ORAL CARE MOUTH RINSE: 15.0000 mL | OROMUCOSAL | Status: DC | PRN

## 2022-06-10 MED ORDER — HEPARIN SODIUM (PORCINE) 1000 UNIT/ML DIALYSIS: 2000.0000 [IU] | INTRAMUSCULAR | Status: DC | PRN

## 2022-06-10 NOTE — Progress Notes (Signed)
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  Patient recently underwent robotic assisted laparoscopic hysterectomy, bilateral salpingo-oopherectomy, and sacrocolpopexy for pelvic organ prolapse on 8/18, discharged home on 8/19 with tramadol.  Family report she wasn't at her baseline when she was discharged, was confused, but they were trying to get her home for her scheduled dialysis on Saturday.  However, she was only able to tolerate one hour due to hypotension.  Since, family report ongoing weakness, lethargy, poor PO intake, with patient primarily sleeping since.  At baseline, family reports patient is fully dependent in her ADLs, drives, and still works as Neurosurgeon.    Found by EMS with SBP 79, CBG 64, room air saturation 79%, and responsive to verbal/ painful stimuli. O2 improved with 3L O2 and given glucagon.  IV access only able to be obtained in ER.  Was afebrile with improved SBP in the 90's.  Given 1.5L LR.  Labs noted for WBC 16.1, H/H 7.8/ 26.6 (10.1/ 35.7> 8.2/ 28.9 on discharge 8/19), plts 137, K 5.8, bicarb 18, BUN 42, sCr 6.8, alk phos 138, albumin 3.1, AST 126, INR 1.7, UA with large Hgb, RBC> 50, leukocytes, WBC> 50.  EKG noted afib, rate 90's (afib new for patient).  CXR noted for low lung volumes, stable cardiomegaly, chronic interstitial prominence attributed to underlying fibrosis.  CT head negative for acute intracranial abnormality.  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  Blood and urine cultures sent and started on empiric antibiotics.    Assessment & Plan:   Principal Problem:   Sepsis ([place])   Severe sepsis, - likely related to aspiration pneumonia, possible UTI  -CT negative for intra-abdominal process - and can not fully rule out intra-abdominal process but less likely. Bilateral pleural effusions with lower lob consolidation on CT A/P. -Sepsis postthoracentesis, cultures with no growth to date. -Improving, afebrile, blood pressure has improved, blood cultures and urine cultures with no growth to date -Continue with Zosyn, can be transitioned to Augmentin tomorrow.   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  Is not on home O2. -She was seen by SLP, advance to dysphagia 1 with liquids -Weaned to room air today. -Encouraged to use incentive spirometry and flutter valve    ESRD  -HD per renal, to receive short course of HD today to go back on her schedule TTS  Hyperkalemia -Resolved  AGMA/ lactic acidosis  - resolved   Acute metabolic/ septic encephalopathy/hospital delirium CTH neg. AMS improved today, following some commands, but distractible, only oriented to self, place and year. EEG showed no seizure like activity, but was positive for encephalopathy. Ammonia level normal, patient also received dialysis yesterday. Patient also received narcan yesterday with no change in mental status,  with appropriate CBG's making hypoglycemia unlikely.  -CT head with no acute findings   Lone A-fib/new onset  Afib POA, but resolved shortly after. CHA2DS2-VASc score 5.  -No evidence of A-fib over last 48 hours.  if she  had recurrent A-fib then would consider anticoagulation.      Anemia of chronic disease + mild component of ABLA - baseline Hgb 9-10.  Hgb 10.1 pre-op down to 8.2 after surgery.  Today Hgb 8.1 (7.1) improved from yesterday, no signs or symptoms of bleeding.   -Continue to monitor/ trend.   -Transfuse for Hgb< 7     Mild Transaminitis  CT a/p noted chronic extrahepatic biliary dilatation post cholecystectomy, likely physiologic. Alk phos normal, AST rise to 665 (420), ALT rise 90 (47). RUQ US showed chronic common bile duct dilation, likely 2/2 cholecystectomy, no signs of stones or blockage. Hep B and Hep C screen negative.  -Due to shock liver, trending down     Coagulopathy INR 1.7 on admission. 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 Louis Meckel 8/18. CT a/p with changes c/w post surgery. Wounds healing with no sign of infection.   - Trend clinical abd exams.  -Urology input appreciated     DVT prophylaxis: Ruth heparin Code Status: Full Family Communication: None at bedside today Disposition: Home with home health in 1 to 2 days  Status is: Inpatient    Consultants:  Renal PCCM urology  Subjective:  No significant events overnight, she denies any complaints.  Objective: Vitals:   06/10/22 0600 06/10/22 0745 06/10/22 0900 06/10/22 0950  BP:   (!) 144/53 (!) 118/58  Pulse: 82  79 79  Resp: (!) 23  (!) 21   Temp:  98.3 F (36.8 C)  98 F (36.7 C)  TempSrc:  Oral  Oral  SpO2: 97%  100%   Weight:      Height:        Intake/Output Summary (Last 24 hours) at 06/10/2022 1100 Last data filed at 06/10/2022 0537 Gross per 24 hour  Intake 271.63 ml  Output 150 ml  Net 121.63 ml   Filed Weights   06/09/22 0036 06/09/22 2031 06/10/22 0500  Weight: 64 kg 64 kg 63.7 kg     Examination:  Awake Alert, Oriented X 3, No new F.N deficits, Normal affect Symmetrical Chest wall movement, Good air movement bilaterally, CTAB RRR,No Gallops,Rubs or new Murmurs, No Parasternal Heave +ve B.Sounds, Abd Soft, No tenderness, No rebound - guarding or rigidity. No Cyanosis, Clubbing or edema, No new Rash or bruise      Data Reviewed: I have personally reviewed following labs and imaging studies  CBC: Recent Labs  Lab 06/05/22 1347 06/05/22 2232 06/06/22 0451 06/07/22 0208 06/08/22 0648 06/09/22 0119 06/10/22 0052  WBC 16.1*  --  10.6* 8.3 6.4 7.0 7.5  NEUTROABS 13.8*  --   --   --   --   --   --   HGB 7.8*   < > 7.1* 8.1* 8.1* 8.4* 8.8*  HCT 26.6*   < > 23.5* 26.8* 25.9* 27.2* 28.7*  MCV 106.4*  --  101.7* 101.5* 98.9 98.6 100.7*  PLT 137*  --  121* 106* 102* 103* 105*   < > = values in this interval not displayed.    Basic Metabolic Panel: Recent Labs  Lab 06/06/22 0452 06/07/22 0208 06/08/22 0647 06/09/22 0119 06/10/22 0052  NA 142 134* 138 135 138  K 4.7 3.7 3.3* 3.3* 3.4*  CL 99 96* 97* 96* 99  CO2 $Re'29 29 30 27 30  'VmD$ GLUCOSE 103* 91 81 90 87  BUN 43* $Remov'18 11 18 12  'cJrQpD$ CREATININE 6.19* 3.69* 2.71* 3.55* 2.71*  CALCIUM 7.6* 7.9* 7.9* 8.1* 8.1*  PHOS 5.2* 3.7 3.0  --   --     GFR: Estimated Creatinine Clearance: 14.7 mL/min (A) (by C-G formula based on SCr of 2.71 mg/dL (H)).  Liver Function Tests: Recent Labs  Lab 06/06/22 0451 06/06/22 0452 06/07/22 0208 06/08/22 0647 06/08/22 0648 06/09/22 0119 06/10/22 0052  AST 420*  --  665*  --  300* 177* 96*  ALT 47*  --  90*  --  57* 41 29  ALKPHOS 96  --  88  --  81 74 74  BILITOT 0.5  --  0.9  --  1.0 1.0 0.9  PROT 5.5*  --  5.7*  --  5.6* 5.7* 5.7*  ALBUMIN 2.5*   < > 2.4*  2.5* 2.3* 2.2* 2.4* 2.2*   < > = values in this interval not displayed.    CBG: Recent Labs  Lab 06/09/22 2320 06/10/22 0331 06/10/22 0404 06/10/22 0742 06/10/22 0744  GLUCAP 81 61* 98 51* 73     Recent  Results (from the past 240 hour(s))  Blood Culture (routine x 2)     Status: None   Collection Time: 06/05/22 12:25 PM   Specimen: BLOOD RIGHT ARM  Result Value Ref Range Status   Specimen Description   Final    BLOOD RIGHT ARM UPPER Performed at [facility], [place] [address], [place], [place] [address]    Special Requests   Final    BOTTLES DRAWN AEROBIC AND ANAEROBIC Blood Culture adequate volume Performed at [place] [facility] [address], [place], [place] [address]    Culture   Final    NO GROWTH 5 DAYS Performed at [facility], [place] [address], [place], [place] [address]    Report Status 06/10/2022 FINAL  Final  Urine Culture     Status: None   Collection Time: 06/05/22  1:19 PM   Specimen: In/Out Cath Urine  Result Value Ref Range Status   Specimen Description   Final    IN/OUT CATH URINE Performed at [facility] [address], [place], [place] [address]    Special Requests   Final    NONE Performed at [facility], [place] [address], [place], [place] [address]    Culture   Final    NO GROWTH Performed at [facility], [place] [address], [place], [place] [address]    Report Status 06/06/2022 FINAL  Final  MRSA Next Gen by PCR, Nasal     Status: None   Collection Time: 06/05/22  8:25 PM   Specimen: Nasal Mucosa; Nasal Swab  Result Value Ref Range Status   MRSA by PCR Next Gen NOT DETECTED NOT DETECTED Final    Comment: (NOTE) The GeneXpert MRSA Assay (FDA approved for NASAL specimens only), is one component of a comprehensive MRSA colonization surveillance program. It is not intended to diagnose MRSA infection nor to guide or monitor treatment for MRSA infections. Test performance is not FDA approved in patients less than 39 years old. Performed at [facility], [place],  [place] [address]   Body fluid culture w Gram Stain     Status: None   Collection Time: 06/07/22 11:19  AM   Specimen: Pleural Fluid  Result Value Ref Range Status   Specimen Description PLEURAL  Final   Special Requests NONE  Final   Gram Stain   Final    WBC PRESENT,BOTH PMN AND MONONUCLEAR NO ORGANISMS SEEN CYTOSPIN SMEAR    Culture   Final    NO GROWTH 3 DAYS Performed at [facility], [place] [address], [place], [place] [address]    Report Status 06/10/2022 FINAL  Final         Radiology Studies: No results found.      Scheduled Meds:  amLODipine  5 mg Oral Daily   Chlorhexidine Gluconate Cloth  6 each Topical Daily   [START ON 06/13/2022] darbepoetin (ARANESP) injection - DIALYSIS  60 mcg Intravenous Q Tue-HD   feeding supplement (NEPRO CARB STEADY)  237 mL Oral TID BM   heparin  5,000 Units Subcutaneous Q8H   lisinopril  20 mg Oral Daily   multivitamin  1 tablet Oral QHS   mouth rinse  15 mL Mouth Rinse 4 times per day   Continuous Infusions:  sodium chloride Stopped (06/09/22 1641)   piperacillin-tazobactam (ZOSYN)  IV 2.25 g (06/10/22 0602)     LOS: 5 days      Phillips Climes, MD [facility]   To contact the attending provider between 7A-7P or the covering provider during after hours 7P-7A, please log into the web site [email or web address] and access using universal [facility] password for that web site. If you do not have the password, please call the hospital operator.  06/10/2022, 11:00 AM

## 2022-06-10 NOTE — Progress Notes (Signed)
Speech Language Pathology Treatment: Dysphagia  Patient Details Name: Krista Gordon MRN: [id number] DOB: 1941-04-08 Today's Date: 06/10/2022 Time: 5364-6803 SLP Time Calculation (min) (ACUTE ONLY): 18 min  Assessment / Plan / Recommendation Clinical Impression  Pt transferred out of the ICU today. Her mentation has improved. New orders were received to determine readiness to advance diet. Ms. Krista Gordon was pleasant, quiet. She needed assist with opening packages; when items were handed to her she could feed herself.  There was improved attention to regular solids with sips of liquid utilized to help with transition. There were no overt s/s of aspiration. Pt verbalizes her distaste for purees - she appears to be ready to advance to regular solids, but will need assist with tray set-up and self-feeding.    Will advance to regular/thin liquids; meds whole in puree. SLP will follow briefly to ensure safety/toleration.   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  Family report she wasn't at her baseline when she was discharged, was confused, but they were trying to get her home for her scheduled dialysis on Saturday.  However, she was only able to tolerate one hour due to hypotension.  Since, family report ongoing weakness, lethargy, poor PO intake, with patient primarily sleeping since.  At baseline, family reports patient is fully dependent in her ADLs, drives, and still works as Neurosurgeon.      SLP Plan  Continue with current  plan of care      Recommendations for follow up therapy are one component of a multi-disciplinary discharge planning process, led by the attending physician.  Recommendations may be updated based on patient status, additional functional criteria and insurance authorization.    Recommendations  Diet recommendations: Regular;Thin liquid Liquids provided via: Cup;Straw Medication Administration: Whole meds with puree Supervision: Staff to assist with self feeding Compensations: Slow rate;Small sips/bites                Oral Care Recommendations: Oral care BID Follow Up Recommendations: Skilled nursing-short term rehab (<3 hours/day) Assistance recommended at discharge: Frequent or constant Supervision/Assistance SLP Visit Diagnosis: Dysphagia, oropharyngeal phase (R13.12) Plan: Continue with current plan of care         [place]. Tivis Ringer, MA CCC/SLP Clinical Specialist - Acute Care SLP Acute Rehabilitation Services Office number [phone]   Juan Quam Laurice  06/10/2022, 10:13 AM

## 2022-06-10 NOTE — Progress Notes (Signed)
Mobility Specialist Progress Note   06/10/22 1214  Mobility  Activity Ambulated with assistance in hallway  Level of Assistance Minimal assist, patient does 75% or more  Assistive Device Front wheel walker  Distance Ambulated (ft) 180 ft  Activity Response Tolerated well  $Mobility charge 1 Mobility   Received pt in bed having no complaints and agreeable. Required MinA to get to EOB d/t trunk weakness but contact guard throughout ambulation. Session limited d/t HD transport arriving. Returned back to room w/o fault and left EOB w/ food tray in front.    Holland Falling Mobility Specialist MS [facility] #:  [phone] Acute Rehab Office:  [phone]

## 2022-06-10 NOTE — Progress Notes (Signed)
Pt transferred in wheelchair on monitor to 5M19. Chart and belongings taken with patient.

## 2022-06-10 NOTE — Progress Notes (Signed)
This rn stopped UF and NS bolus 100 ml x 3 given d/t sharp right lower abdominal pain, pt denies nausea. [facility] nephrology PA informed of event, will keep UF off for 57 minute remaining tx per PA instructions.

## 2022-06-10 NOTE — Progress Notes (Addendum)
[facility] Progress Note  Subjective: in good spirits. No c/os today.   Vitals:   06/10/22 1630 06/10/22 1655 06/10/22 1802 06/10/22 2125  BP: 104/61  132/64 (!) 121/51  Pulse: 72  71 78  Resp: '20  18 14  '$ Temp: 97.6 F (36.4 C)  98.4 F (36.9 C) 98.1 F (36.7 C)  TempSrc:   Oral   SpO2: 100%  100% 95%  Weight:  62.7 kg    Height:        Exam: Gen alert, no distress No jvd or bruits Chest clear bilat to bases RRR no MRG Abd soft ntnd obese, clean wounds from recent lap surgery Ext no LE or UE edema Neuro as above AVF +bruit    Home meds include -amlodipine 5, atorvastatin, colchicine, estradiol, famotidine, lisniopril 40 qd, renavite, proair hfa, sodium bicarbonate 2 bid, tramadol, prns/ vits/ supps      UA rbc >50, wbc > 50, 0-5 epis, prot 100    CT abd pelvis > new bibasilar consolidation R > L and new R > L pleural effusions, some underlying fibrotic changes as well    OP HD: TTS [facility] in [place], [facility]  3h 1mn  59kg 350 bfr  Heparin 600u bolus  + 400u /hr   AVF - gets mircera 50 ug q 4 wks, not sure last but was not last week     Assessment/ Plan: Severe sepsis - likely asp PNA and/ or UTI. CT neg for intra-abd process. BCx neg, cont IV abx. Pt is improving daily.  AMS - resolved ESRD - usual HD on TTS. Had HD here Tuesday and Wednesday. Vol / labs are good. Short HD today to get back on schedule.  AHRF/ aspiration PNA - getting IV abx HTN/ volume - wts are up 5- 6 kg. Unable to get fluid off w/ HD due to low BP's. Probably doesn't need BP lowering meds, will dc lisinopril and change norvasc to 2.5 bid and wean off next 1-2 days if not needed.  PAF - new onset, per pmd MBD ckd - CCa and phos in range. No binders for now.  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 Get fe / tibc.  DM - on insulin, per pmd SP recent lower abd surgery 8/18 - underwent lap hysterectomy, bilateral  salpingo-oopherectomy, and sacrocolpopexy by urology    RKelly Splinter8/26/2023, 11:00 PM   Recent Labs  Lab 06/07/22 0208 06/08/22 0647 06/08/22 0648 06/09/22 0119 06/10/22 0052  HGB 8.1*  --    < > 8.4* 8.8*  ALBUMIN 2.4*  2.5* 2.3*   < > 2.4* 2.2*  CALCIUM 7.9* 7.9*  --  8.1* 8.1*  PHOS 3.7 3.0  --   --   --   CREATININE 3.69* 2.71*  --  3.55* 2.71*  K 3.7 3.3*  --  3.3* 3.4*   < > = values in this interval not displayed.    No results for input(s): "IRON", "TIBC", "FERRITIN" in the last 168 hours. Inpatient medications:  amLODipine  5 mg Oral Daily   Chlorhexidine Gluconate Cloth  6 each Topical Daily   [START ON 06/13/2022] darbepoetin (ARANESP) injection - DIALYSIS  60 mcg Intravenous Q Tue-HD   feeding supplement (NEPRO CARB STEADY)  237 mL Oral TID BM   heparin  5,000 Units Subcutaneous Q8H   lisinopril  20 mg Oral Daily   multivitamin  1 tablet Oral QHS  mouth rinse  15 mL Mouth Rinse 4 times per day    sodium chloride Stopped (06/09/22 1641)   piperacillin-tazobactam (ZOSYN)  IV 2.25 g (06/10/22 2138)   sodium chloride, acetaminophen, loperamide, mouth rinse, mouth rinse, white petrolatum

## 2022-06-11 DIAGNOSIS — N186 End stage renal disease: Secondary | ICD-10-CM | POA: Diagnosis not present

## 2022-06-11 DIAGNOSIS — I10 Essential (primary) hypertension: Secondary | ICD-10-CM | POA: Diagnosis not present

## 2022-06-11 DIAGNOSIS — R6520 Severe sepsis without septic shock: Secondary | ICD-10-CM | POA: Diagnosis not present

## 2022-06-11 DIAGNOSIS — A4102 Sepsis due to Methicillin resistant Staphylococcus aureus: Secondary | ICD-10-CM | POA: Diagnosis not present

## 2022-06-11 DIAGNOSIS — Z992 Dependence on renal dialysis: Secondary | ICD-10-CM

## 2022-06-11 LAB — CBC
HCT: 27.1 % — ABNORMAL LOW (ref 36.0–46.0)
Hemoglobin: 8.4 g/dL — ABNORMAL LOW (ref 12.0–15.0)
MCH: 30.8 pg (ref 26.0–34.0)
MCHC: 31 g/dL (ref 30.0–36.0)
MCV: 99.3 fL (ref 80.0–100.0)
Platelets: 124 10*3/uL — ABNORMAL LOW (ref 150–400)
RBC: 2.73 MIL/uL — ABNORMAL LOW (ref 3.87–5.11)
RDW: 20.3 % — ABNORMAL HIGH (ref 11.5–15.5)
WBC: 7.4 10*3/uL (ref 4.0–10.5)
nRBC: 0 % (ref 0.0–0.2)

## 2022-06-11 LAB — BASIC METABOLIC PANEL
Anion gap: 11 (ref 5–15)
BUN: 9 mg/dL (ref 8–23)
CO2: 27 mmol/L (ref 22–32)
Calcium: 8 mg/dL — ABNORMAL LOW (ref 8.9–10.3)
Chloride: 100 mmol/L (ref 98–111)
Creatinine, Ser: 2.81 mg/dL — ABNORMAL HIGH (ref 0.44–1.00)
GFR, Estimated: 17 mL/min — ABNORMAL LOW (ref >60)
Glucose, Bld: 69 mg/dL — ABNORMAL LOW (ref 70–99)
Potassium: 3.1 mmol/L — ABNORMAL LOW (ref 3.5–5.1)
Sodium: 138 mmol/L (ref 135–145)

## 2022-06-11 LAB — GLUCOSE, CAPILLARY
Glucose-Capillary: 71 mg/dL (ref 70–99)
Glucose-Capillary: 74 mg/dL (ref 70–99)
Glucose-Capillary: 71 mg/dL (ref 70–99)

## 2022-06-11 MED ORDER — PANTOPRAZOLE SODIUM 40 MG PO TBEC: 40.0000 mg | DELAYED_RELEASE_TABLET | Freq: Every day | ORAL | 1 refills | Status: DC

## 2022-06-11 MED ORDER — ASPIRIN 81 MG PO TBEC: 81.0000 mg | DELAYED_RELEASE_TABLET | Freq: Every day | ORAL | Status: DC

## 2022-06-11 MED ORDER — AMOXICILLIN-POT CLAVULANATE 500-125 MG PO TABS: 1.0000 | ORAL_TABLET | Freq: Every day | ORAL | 0 refills | Status: DC

## 2022-06-11 MED ORDER — ASPIRIN 81 MG PO TBEC: 81.0000 mg | DELAYED_RELEASE_TABLET | Freq: Every day | ORAL | 12 refills | Status: AC

## 2022-06-11 NOTE — Discharge Summary (Signed)
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  Patient recently underwent robotic assisted laparoscopic hysterectomy, bilateral salpingo-oopherectomy, and sacrocolpopexy for pelvic organ prolapse on 8/18, discharged home on 8/19 with tramadol.  Family report she wasn't at her baseline when she was discharged, was confused, but they were trying to get her home for her scheduled dialysis on Saturday.  However, she was only able to tolerate one hour due to hypotension.  Since, family report ongoing weakness, lethargy, poor PO intake, with patient primarily sleeping since.  At baseline, family reports patient is fully dependent in her ADLs, drives, and still works as Neurosurgeon.   Found by EMS with SBP 79, CBG 64, room air saturation 79%, and responsive to verbal/ painful stimuli. O2 improved with 3L O2 and given glucagon.  IV access only able to be obtained in ER.  Was afebrile with improved SBP in the 90's.  Given 1.5L LR.  Labs noted for WBC 16.1, H/H 7.8/ 26.6 (10.1/ 35.7> 8.2/ 28.9 on discharge 8/19), plts 137, K 5.8, bicarb 18, BUN 42, sCr 6.8, alk phos 138, albumin 3.1, AST 126, INR 1.7, UA with large Hgb, RBC> 50, leukocytes, WBC> 50.  EKG noted afib, rate 90's (afib new for patient).  CXR noted for low lung volumes, stable cardiomegaly, chronic interstitial prominence attributed to underlying fibrosis.  CT head  negative for acute intracranial abnormality.  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  Blood and urine cultures sent and started on empiric antibiotics.      Severe sepsis, - likely related to aspiration pneumonia. -CT negative for intra-abdominal process -status post thoracentesis, cultures with no growth to date. -Improving, afebrile, blood pressure has improved, blood cultures and urine cultures with no growth to date -Treated with with Zosyn hospital stay, transition to Augmentin as an outpatient.   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  Is not on home O2. -She was seen by SLP, currently on renal diet -Weaned to room air today. -Encouraged to use incentive spirometry and flutter valve, she was instructed to take this devices at home and keep using them.    ESRD  -HD per renal, to receive short course of HD today to go back on her schedule TTS   Hyperkalemia -Resolved   AGMA/ lactic acidosis  - resolved   Acute metabolic/ septic encephalopathy/hospital delirium -CT head with no acute findings, EEG with no seizure activities, significant for encephalopathy, ammonia level is normal. -Resolved, mentation back to baseline   Lone A-fib/new  onset  Afib POA, but resolved shortly after. CHA2DS2-VASc score 5.  -No evidence of A-fib after that, only single event, so no anticoagulation was initiated.  She is started on aspirin.    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 Alk phos normal, AST rise to 665 (420), ALT rise 90 (47). RUQ US showed chronic common bile duct dilation, likely 2/2 cholecystectomy, no signs of stones or blockage. Hep B and Hep C screen negative.  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 Louis Meckel 8/18. CT a/p with changes c/w post surgery. Wounds healing with no sign of infection.   -Urology input appreciated      Discharge Diagnoses:  Principal Problem:   Sepsis ([place]) Active Problems:   Essential hypertension   HLD (hyperlipidemia)   ESRD on dialysis [facility])    Discharge Instructions  Discharge Instructions     Diet - low sodium heart healthy   Complete by: As directed    Diet - low sodium heart healthy   Complete by: As directed    Discharge instructions   Complete by: As directed    Follow with Primary MD Marguerita Merles, MD in 7 days   Get CBC, CMP, 2 view Chest X ray checked  by Primary MD next visit.    Activity: As tolerated with Full fall precautions use walker/cane & assistance as needed   Disposition Home    Diet: Heart Healthy /renal   On your next visit with your primary care physician please Get Medicines reviewed and adjusted.   Please request your Prim.MD to go over all Hospital Tests and Procedure/Radiological results at the follow up, please get all Hospital records sent to your Prim MD by signing hospital release before you go home.   If you experience worsening of your admission symptoms, develop shortness of breath, life threatening emergency, suicidal or homicidal thoughts you must seek medical  attention immediately by calling 911 or calling your MD immediately  if symptoms less severe.  You Must read complete instructions/literature along with all the possible adverse reactions/side effects for all the Medicines you take and that have been prescribed to you. Take any new Medicines after you have completely understood and accpet all the possible adverse reactions/side effects.   Do not drive, operating heavy machinery, perform activities at heights, swimming or participation in water activities or provide baby sitting services if your were admitted for syncope or siezures until you have seen by Primary MD or a Neurologist and advised to do so again.  Do not drive when taking Pain medications.    Do not take more than prescribed Pain, Sleep and Anxiety Medications  Special Instructions: If you have smoked or chewed Tobacco  in the last 2 yrs please stop smoking, stop any regular Alcohol  and or any Recreational drug use.  Wear Seat belts while driving.   Please note  You were cared for by a hospitalist during your hospital stay. If you have any questions about your discharge medications or the care you received while you were in the hospital after you are discharged, you can call the unit and  asked to speak with the hospitalist on call if the hospitalist that took care of you is not available. Once you are discharged, your primary care physician will handle any further medical issues. Please note that NO REFILLS for any discharge medications will be authorized once you are discharged, as it is imperative that you return to your primary care physician (or establish a relationship with a primary care physician if you do not have one) for your aftercare needs so that they can reassess your need for medications and monitor your lab values.   Discharge instructions   Complete by: As directed        Increase activity slowly   Complete by: As directed    Increase activity slowly   Complete  by: As directed    No wound care   Complete by: As directed    No wound care   Complete by: As directed       Allergies as of 06/11/2022       Reactions   Mircera [methoxy Polyethylene Glycol-epoetin Beta]    Patient doesn't recall this allergy   Tramadol Itching        Medication List     STOP taking these medications    cephALEXin 500 MG capsule Commonly known as: KEFLEX   lisinopril 40 MG tablet Commonly known as: ZESTRIL   traMADol 50 MG tablet Commonly known as: Ultram       TAKE these medications    amLODipine 5 MG tablet Commonly known as: NORVASC Take 5 mg by mouth daily.   amoxicillin-clavulanate 500-125 MG tablet Commonly known as: Augmentin Take 1 tablet (500 mg total) by mouth at bedtime.   aspirin EC 81 MG tablet Take 1 tablet (81 mg total) by mouth daily. Swallow whole.   atorvastatin 40 MG tablet Commonly known as: LIPITOR Take 40 mg by mouth at bedtime.   colchicine 0.6 MG tablet Take 0.6 mg by mouth every other day.   estradiol 0.1 MG/GM vaginal cream Commonly known as: ESTRACE VAGINAL Place 1 Applicatorful vaginally 3 (three) times a week. Use 1 small dolyp of cream on tip of index finger and swap the inside of the vagina   famotidine 10 MG tablet Commonly known as: PEPCID Take 1 tablet (10 mg total) by mouth daily.   multivitamin Tabs tablet Take 1 tablet by mouth at bedtime.   mupirocin ointment 2 % Commonly known as: BACTROBAN Apply topically 2 (two) times daily.   pantoprazole 40 MG tablet Commonly known as: Protonix Take 1 tablet (40 mg total) by mouth daily.   ProAir HFA 108 (90 Base) MCG/ACT inhaler Generic drug: albuterol Inhale 2 puffs into the lungs every 6 (six) hours as needed for shortness of breath.   sodium bicarbonate 650 MG tablet Take 1,300 mg by mouth 2 (two) times daily.               Durable Medical Equipment  (From admission, onward)           Start     Ordered   06/09/22 1447  For  home use only DME 3 n 1  Once        06/09/22 1446   06/09/22 1447  For home use only DME Tub bench  Once        06/09/22 1446            Follow-up Information     [facility] [place] Follow up.   Specialty: Home Health Services Why: Someone  will call you to schedule first home visit. Contact information: [place] [address] [phone]                Allergies  Allergen Reactions   Mircera [Methoxy Polyethylene Glycol-Epoetin Beta]     Patient doesn't recall this allergy   Tramadol Itching    Consultations:  Renal PCCM urology    Procedures/Studies: CT HEAD WO CONTRAST (5MM)  Result Date: 06/08/2022 CLINICAL DATA:  81 year old female with altered mental status. EXAM: CT HEAD WITHOUT CONTRAST TECHNIQUE: Contiguous axial images were obtained from the base of the skull through the vertex without intravenous contrast. RADIATION DOSE REDUCTION: This exam was performed according to the departmental dose-optimization program which includes automated exposure control, adjustment of the mA and/or kV according to patient size and/or use of iterative reconstruction technique. COMPARISON:  Head CT 06/05/2022. FINDINGS: Brain: Cerebral volume is within normal limits for age. No midline shift, ventriculomegaly, mass effect, evidence of mass lesion, intracranial hemorrhage or evidence of cortically based acute infarction. Patchy bilateral white matter hypodensity appears stable. Elsewhere gray-white differentiation appears normal for age. No cortical encephalomalacia identified. Vascular: Mild Calcified atherosclerosis at the skull base. No suspicious intracranial vascular hyperdensity. Skull: No acute osseous abnormality identified. Sinuses/Orbits: Visualized paranasal sinuses and mastoids are clear. Other: Similar Disconjugate gaze. No acute orbit or scalp soft tissue finding. IMPRESSION: No acute intracranial abnormality. 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 Electronically Signed   By: Genevie Ann M.D.   On: 06/08/2022 04:24   DG Swallowing Func-Speech Pathology  Result Date: 06/07/2022 Table formatting from the original result was not included. Images from the original result were not included. Objective Swallowing Evaluation: Type of Study: MBS-Modified Barium Swallow Study  Patient Details Name: SANAYAH MUNRO MRN: [id number] Date of Birth: Sep 16, 1941 Today's Date: 06/07/2022 Time: SLP Start Time (ACUTE ONLY): 1340 -SLP Stop Time (ACUTE ONLY): 1400 SLP Time Calculation (min) (ACUTE ONLY): 20 min Past Medical History: Past Medical History: Diagnosis Date  Anemia   COVID   patient states had COVID "over a month ago and in hospital 3 days"  Diabetes mellitus without complication ([place])   ESRD on hemodialysis ([place])   kidney disease  Gall stone   Gout   Hyperlipidemia   Hypertension  Past Surgical History: Past Surgical History: Procedure Laterality Date  A/V FISTULAGRAM Left 12/05/2021  Procedure: A/V Fistulagram;  Surgeon: Algernon Huxley, MD;  Location: [facility];  Service: Cardiovascular;  Laterality: Left;  A/V SHUNTOGRAM Left 02/16/2020  Procedure: A/V SHUNTOGRAM;  Surgeon: Algernon Huxley, MD;  Location: [facility];  Service: Cardiovascular;  Laterality: Left;  AV FISTULA PLACEMENT Left 01/15/2020  Procedure: INSERTION OF ARTERIOVENOUS (AV) GORE-TEX GRAFT ARM;  Surgeon: Algernon Huxley, MD;  Location: [facility] OR[facility];  Service: Vascular;  Laterality: Left;  COLONOSCOPY WITH PROPOFOL N/A 01/27/2016  Procedure: COLONOSCOPY WITH PROPOFOL;  Surgeon: Hulen Luster, MD;  Location: [facility];  Service: Gastroenterology;  Laterality: N/A;  ROBOTIC ASSISTED LAPAROSCOPIC SACROCOLPOPEXY Bilateral 06/02/2022  Procedure: XI ROBOTIC ASSISTED LAPAROSCOPIC SACROCOLPOPEXY SUPRACEVICAL HYSTERECTOMY AND BILATERAL SALPINGO OOPHERECTOMY;  Surgeon: Ardis Hughs, MD;  Location: [facility];  Service:  Urology;  Laterality: Bilateral;  4.5 HRS FOR THIS CASE  TUBAL LIGATION   HPI: 81 year old female with PMH as below, significant for 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  Family report she wasn't at her baseline when she was discharged, was confused, but they were trying to get her home for her scheduled dialysis on Saturday.  However, she was only able to tolerate one hour due to hypotension.  Since, family report ongoing weakness, lethargy, poor PO intake, with patient primarily sleeping since.  At baseline, family reports patient is fully dependent in her ADLs, drives, and still works as Neurosurgeon.  No data recorded  Recommendations for follow up therapy are one component of a multi-disciplinary discharge planning process, led by the attending physician.  Recommendations may be updated based on patient status, additional functional criteria and insurance authorization. Assessment / Plan / Recommendation   06/07/2022   2:16 PM Clinical Impressions Clinical Impression Pt demonstrates mild oropharyngeal impiarment. Oral manipulation of puree and mastication and bolus formation of regular solids is prolonged and laborious. Pt has dentures but no denture paste which may be impacting effective chewing. She is also confused. 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 Pt has to swallow 2-3 time per bolus due to this strucural change. No aspiration occurred. Esophageal sweep showed  barium tablet hesitate in the distal esophagus, but it passed with further liquid swallows. Pt is recommended to consume a puree diet with thin liquids. SLP will f/u to advance diet next week if mentation has improved. SLP Visit Diagnosis Dysphagia, unspecified (R13.10) Impact on safety and function Mild aspiration risk     06/07/2022   2:16 PM Treatment Recommendations Treatment Recommendations Therapy as outlined in treatment plan below     06/07/2022   2:16 PM Prognosis Prognosis for Safe Diet Advancement Good Barriers to Reach Goals Cognitive deficits   06/07/2022   2:16 PM Diet Recommendations SLP Diet Recommendations Dysphagia 1 (Puree) solids;Thin liquid Liquid Administration via Cup;Straw Medication Administration Whole meds with liquid Compensations Slow rate;Small sips/bites     06/07/2022   2:16 PM Other Recommendations Follow Up Recommendations Skilled nursing-short term rehab (<3 hours/day)   06/07/2022   2:16 PM Frequency and Duration  Speech Therapy Frequency (ACUTE ONLY) min 2x/week Treatment Duration 2 weeks     06/07/2022   2:16 PM Oral Phase Oral Phase Impaired Oral - Thin Cup WFL Oral - Thin Straw WFL Oral - Puree Reduced posterior propulsion;Decreased bolus cohesion;Delayed oral transit Oral - Regular Reduced posterior propulsion;Impaired mastication;Decreased bolus cohesion;Delayed oral transit Oral - Pill [facility]    06/07/2022   2:16 PM Pharyngeal Phase Pharyngeal Phase Impaired Pharyngeal- Thin Cup Reduced epiglottic inversion;Delayed swallow initiation-pyriform sinuses;Delayed swallow initiation-vallecula Pharyngeal- Thin Straw Reduced epiglottic inversion;Delayed swallow initiation-pyriform sinuses;Delayed swallow initiation-vallecula Pharyngeal- Puree Reduced epiglottic inversion;Delayed swallow initiation-vallecula Pharyngeal- Regular Reduced epiglottic inversion;Delayed swallow initiation-vallecula Pharyngeal- Pill Reduced epiglottic inversion;Delayed swallow initiation-pyriform sinuses     No data  to display    DeBlois, Katherene Ponto 06/07/2022, 2:57 PM                     DG CHEST PORT 1 VIEW  Result Date: 06/07/2022 CLINICAL DATA:  Status post right thoracentesis EXAM: PORTABLE CHEST 1 VIEW COMPARISON:  Chest x-ray dated June 07, 2022 FINDINGS: Cardiac and mediastinal contours are unchanged. Trace right pleural effusion, decreased in size when compared with prior exam. Stable trace left pleural effusion. No evidence of pneumothorax. Bibasilar atelectasis. Unchanged bilateral reticular opacities, likely due to underlying interstitial lung disease. IMPRESSION: No evidence of pneumothorax status post thoracentesis. Electronically Signed   By: Yetta Glassman M.D.   On: 06/07/2022 13:28   DG CHEST PORT 1 VIEW  Result Date: 06/07/2022 CLINICAL DATA:  Shortness of the breath common pneumonia. EXAM: PORTABLE CHEST 1 VIEW COMPARISON:  Chest radiograph June 06, 2022 FINDINGS: The heart size and mediastinal contours are partially obscured but appear unchanged. Interval progression of the bilateral right-greater-than-left interstitial and airspace opacities. Stable small right with new probable trace left pleural effusions. No acute osseous abnormality. IMPRESSION: Progression of right-greater-than-left interstitial and airspace opacities with a stable small right and probable new left pleural effusion, most consistent with worsening pneumonia. Electronically Signed   By: Dahlia Bailiff M.D.   On: 06/07/2022 08:35   ECHOCARDIOGRAM COMPLETE  Result Date: 06/06/2022    ECHOCARDIOGRAM REPORT   Patient Name:   ESSICA KIKER Date of Exam: 06/06/2022 Medical Rec #:  [id number]        Height:       62.0 in Accession #:    [id number]       Weight:       142.6 lb Date of Birth:  02/06/1941        BSA:          1.656 m Patient Age:    61 years         BP:           102/55 mmHg Patient Gender: F                HR:           77 bpm. Exam Location:  Inpatient Procedure: 2D Echo, Cardiac Doppler, Color Doppler  and Intracardiac            Opacification Agent Indications:    Cardiomyopathy  History:        Patient has no prior history of Echocardiogram examinations.                 Risk Factors:Hypertension, Diabetes and Dyslipidemia.  Sonographer:    Jefferey Pica Referring Phys: [id number] [place]  1. Left ventricular ejection fraction, by estimation, is 50 to 55%. The left ventricle has low normal function. The left ventricle demonstrates global hypokinesis. Left ventricular diastolic parameters are consistent with Grade I diastolic dysfunction (impaired relaxation). There is the interventricular septum is flattened in diastole ('D' shaped left ventricle), consistent with right ventricular volume overload.  2. Right ventricular systolic function is mildly reduced. The right ventricular size is mildly enlarged. There is severely elevated pulmonary artery systolic pressure. The estimated right ventricular systolic pressure is 93.7 mmHg.  3. Right atrial size was severely dilated.  4. The mitral valve is normal in structure. No evidence of mitral valve regurgitation.  5. Tricuspid valve regurgitation is moderate.  6. The aortic valve is tricuspid. Aortic valve regurgitation is not visualized.  7. The inferior vena cava is dilated in size with <50% respiratory variability, suggesting right atrial pressure of 15 mmHg. Comparison(s): No prior Echocardiogram. Conclusion(s)/Recommendation(s): Significantly elevated PASP. FINDINGS  Left Ventricle: Left ventricular ejection fraction, by estimation, is 50 to 55%. The left ventricle has low normal function. The left ventricle demonstrates global hypokinesis. The left ventricular internal  cavity size was normal in size. There is no left ventricular hypertrophy. The interventricular septum is flattened in diastole ('D' shaped left ventricle), consistent with right ventricular volume overload. Left ventricular diastolic parameters are consistent with Grade I  diastolic dysfunction (impaired relaxation). Right Ventricle: The right ventricular size is mildly enlarged. Right ventricular systolic function is mildly reduced. There is severely elevated pulmonary artery systolic pressure. The tricuspid regurgitant velocity is 3.55 m/s, and with an assumed right atrial pressure of 15 mmHg, the estimated right ventricular systolic pressure is 90.3 mmHg. Left Atrium: Left atrial size was normal in size. Right Atrium: Right atrial size was severely dilated. Pericardium: There is no evidence of pericardial effusion. Mitral Valve: The mitral valve is normal in structure. There is mild thickening of the mitral valve leaflet(s). No evidence of mitral valve regurgitation. Tricuspid Valve: Tricuspid valve regurgitation is moderate. Aortic Valve: The aortic valve is tricuspid. Aortic valve regurgitation is not visualized. Aortic valve peak gradient measures 7.7 mmHg. Pulmonic Valve: The pulmonic valve was normal in structure. Pulmonic valve regurgitation is mild. Aorta: The aortic root and ascending aorta are structurally normal, with no evidence of dilitation. Venous: The inferior vena cava is dilated in size with less than 50% respiratory variability, suggesting right atrial pressure of 15 mmHg.  LEFT VENTRICLE PLAX 2D LVIDd:         4.60 cm   Diastology LVIDs:         3.25 cm   LV e' medial:    6.25 cm/s LV PW:         1.00 cm   LV E/e' medial:  8.3 LV IVS:        0.80 cm   LV e' lateral:   11.10 cm/s LVOT diam:     2.00 cm   LV E/e' lateral: 4.7 LV SV:         64 LV SV Index:   39 LVOT Area:     3.14 cm  IVC IVC diam: 2.60 cm LEFT ATRIUM           Index        RIGHT ATRIUM           Index LA diam:      3.50 cm 2.11 cm/m   RA Area:     26.90 cm LA Vol (A2C): 47.1 ml 28.44 ml/m  RA Volume:   113.00 ml 68.24 ml/m LA Vol (A4C): 38.9 ml 23.49 ml/m  AORTIC VALVE                 PULMONIC VALVE AV Area (Vmax): 2.58 cm     PV Vmax:       0.72 m/s AV Vmax:        139.00 cm/s  PV Peak  grad:  2.1 mmHg AV Peak Grad:   7.7 mmHg LVOT Vmax:      114.00 cm/s LVOT Vmean:     62.000 cm/s LVOT VTI:       0.204 m  AORTA Ao Root diam: 3.10 cm Ao Asc diam:  2.90 cm MITRAL VALVE               TRICUSPID VALVE MV Area (PHT): 2.81 cm    TR Peak grad:   50.4 mmHg MV Decel Time: 270 msec    TR Vmax:        355.00 cm/s MV E velocity: 52.10 cm/s MV A velocity: 78.00 cm/s  SHUNTS MV E/A ratio:  0.67  Systemic VTI:  0.20 m                            Systemic Diam: 2.00 cm Phineas Inches Electronically signed by Phineas Inches Signature Date/Time: 06/06/2022/3:24:56 PM    Final    US Abdomen Limited RUQ (LIVER/GB)  Result Date: 06/06/2022 CLINICAL DATA:  Transaminitis. EXAM: ULTRASOUND ABDOMEN LIMITED RIGHT UPPER QUADRANT COMPARISON:  CT abdomen pelvis 06/05/2022 FINDINGS: Gallbladder: Surgically removed Common bile duct: Diameter: 10 mm, unchanged from the prior CT. Also no change from CT of 08/03/2021. Likely post cholecystectomy dilatation. Liver: No focal lesion identified. Within normal limits in parenchymal echogenicity. Portal vein is patent on color Doppler imaging with normal direction of blood flow towards the liver. Other: None IMPRESSION: Postop cholecystectomy. Chronic common bile duct dilatation 10 mm likely due to cholecystectomy. Electronically Signed   By: Franchot Gallo M.D.   On: 06/06/2022 13:49   EEG adult  Result Date: 06/06/2022 Lora Havens, MD     06/06/2022 12:06 PM Patient Name: GWENDLYON ZUMBRO MRN: [id number] Epilepsy Attending: Lora Havens Referring Physician/Provider: Holley Bouche, MD Date: 06/06/2022 Duration: 22.53 mins Patient history: 81 year old female with altered mental status.  EEG to evaluate  for seizure. Level of alertness: Awake AEDs during EEG study: None Technical aspects: This EEG study was done with scalp electrodes positioned according to the 10-20 International system of electrode placement. Electrical activity was reviewed with band pass filter of  1-_0 , sensitivity of 7 uV/mm, display speed of 56m/sec with a _1  notched filter applied as appropriate. EEG data were recorded continuously and digitally stored.  Video monitoring was available and reviewed as appropriate. Description: No clear posterior dominant rhythm was seen. EEG showed continuous generalized 3 to 6 Hz theta-delta slowing. Hyperventilation and photic stimulation were not performed.   ABNORMALITY - Continuous slow, generalized IMPRESSION: This study is suggestive of moderate diffuse encephalopathy, nonspecific etiology. No seizures or epileptiform discharges were seen throughout the recording. PLora Havens  DG CHEST PORT 1 VIEW  Result Date: 06/06/2022 CLINICAL DATA:  Aspiration pneumonia EXAM: PORTABLE CHEST 1 VIEW COMPARISON:  Chest 06/05/2022 FINDINGS: Diffuse bilateral airspace disease has progressed. This is more severe on the right than the left and involves upper and lower lungs. Small right pleural effusion has progressed. No significant left effusion. Progression of bilateral airspace disease right greater than left compatible with pneumonia. Progression of small right pleural effusion. IMPRESSION: No active disease. Electronically Signed   By: CFranchot GalloM.D.   On: 06/06/2022 09:47   CT ABDOMEN PELVIS W CONTRAST  Result Date: 06/05/2022 CLINICAL DATA:  Abdominal pain, acute, nonlocalized. Altered mental status. EXAM: CT ABDOMEN AND PELVIS WITH CONTRAST TECHNIQUE: Multidetector CT imaging of the abdomen and pelvis was performed using the standard protocol following bolus administration of intravenous contrast. RADIATION DOSE REDUCTION: This exam was performed according to the departmental dose-optimization program which includes automated exposure control, adjustment of the mA and/or kV according to patient size and/or use of iterative reconstruction technique. CONTRAST:  762mOMNIPAQUE IOHEXOL 300 MG/ML  SOLN COMPARISON:  Noncontrast abdominopelvic CT 08/03/2021.  FINDINGS: Lower chest: Moderate right and small left pleural effusion are noted with bilateral lower lobe consolidation suspicious for pneumonia or aspiration. Underlying diffuse interstitial prominence noted. The heart is mildly enlarged. Hepatobiliary: The liver is normal in density without suspicious focal abnormality. Extrahepatic biliary dilatation is similar to the previous study and may be physiologic status post  cholecystectomy. No calcified intraductal calculi identified. Pancreas: Unremarkable. No pancreatic ductal dilatation or surrounding inflammatory changes. Spleen: Normal in size without focal abnormality. Adrenals/Urinary Tract: Both adrenal glands appear normal. The left kidney appears unremarkable. 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 Appearance is unchanged from the prior examination. A Foley catheter is in place with decompression of the bladder. There is possible bladder wall thickening and surrounding inflammation. Stomach/Bowel: No enteric contrast administered. The stomach appears unremarkable for its degree of distension. No evidence of bowel wall thickening, distention or surrounding inflammatory change. 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 Vascular/Lymphatic: There are no enlarged abdominal or pelvic lymph nodes. Aortic and branch vessel atherosclerosis without evidence of aneurysm or large vessel occlusion. The portal, superior mesenteric and splenic veins are patent. Reproductive: Hysterectomy.  No adnexal mass. Other: Possible small amount of free pelvic fluid. No focal extraluminal fluid or gas collections are seen within the peritoneal cavity or retroperitoneum. However, there is extensive subcutaneous emphysema throughout the anterior abdominal wall extending into the perineum. Musculoskeletal: No acute or significant osseous findings. Diffuse  thoracolumbar spondylosis with bridging osteophytes and facet hypertrophy. IMPRESSION: 1. 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 In discussion with Dr. Jeanell Sparrow, patient underwent robotic assisted laparoscopic hysterectomy 3 days ago, and this emphysema is likely related to this procedure, not necessarily implying a complication or soft tissue infection. Correlate clinically. No pneumoperitoneum or focal fluid collection. 2. New bilateral pleural effusions with lower lobe consolidation suspicious for aspiration in the setting of recent surgery. 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 3. The bladder is decompressed by a Foley catheter. Possible bladder wall thickening which could be inflammatory. 4. 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 Chronic extrahepatic biliary dilatation post cholecystectomy, likely physiologic. Aortic Atherosclerosis (ICD10-I70.0). 5. These results were called by telephone at the time of interpretation on 06/05/2022 at 2:08 pm to provider [facility] RAY , who verbally acknowledged these results. Electronically Signed   By: Richardean Sale M.D.   On: 06/05/2022 14:09   CT Head Wo Contrast  Result Date: 06/05/2022 CLINICAL DATA:  Mental status change. EXAM: CT HEAD WITHOUT CONTRAST TECHNIQUE: Contiguous axial images were obtained from the base of the skull through the vertex without intravenous contrast. RADIATION DOSE REDUCTION: This exam was performed according to the departmental dose-optimization program which includes automated exposure control, adjustment of the mA and/or kV according to patient size and/or use of iterative reconstruction technique. COMPARISON:  None Available. FINDINGS: Brain: No evidence of acute infarction, hemorrhage, hydrocephalus, extra-axial collection or mass lesion/mass effect. Vascular: No hyperdense vessel  or unexpected calcification. Skull: Normal. Negative for fracture or focal lesion. Sinuses/Orbits: No acute finding. Other: None. IMPRESSION: No acute intracranial abnormality. Electronically Signed   By: Keane Police D.O.   On: 06/05/2022 13:38   DG Chest Port 1 View  Result Date: 06/05/2022 CLINICAL DATA:  Found unresponsive. Altered mental status. History of hypertension and diabetes. EXAM: PORTABLE CHEST 1 VIEW COMPARISON:  Radiographs 10/16/2021 FINDINGS: 2024 hours. Lower lung volumes. Allowing for this, the heart size and mediastinal contours are stable. Probable underlying diffuse fibrotic changes in the lungs with superimposed bibasilar atelectasis attributed to the lower lung volumes. No definite edema, confluent airspace opacity, significant pleural effusion or pneumothorax. Degenerative changes throughout the spine without evidence of acute osseous abnormality.  Telemetry leads overlie the chest. IMPRESSION: Stable cardiomegaly and chronic interstitial prominence attributed to underlying fibrosis. Allowing for suboptimal inspiration on the current study, no definite acute superimposed abnormality. Electronically Signed   By: Richardean Sale M.D.   On: 06/05/2022 12:14   DG Foot Complete Right  Result Date: 05/18/2022 CLINICAL DATA:  Initial encounter for trauma and pain. EXAM: RIGHT FOOT COMPLETE - 3+ VIEW COMPARISON:  None Available. FINDINGS: Osteopenia. Degenerative changes including about the midfoot, first metatarsophalangeal joint. Small Achilles and calcaneal spurs. Minimally displaced, oblique longitudinal fracture involving the proximal portion of the proximal phalanx of the first digit. Involvement of the first MTP joint. IMPRESSION: 1. Proximal phalangeal fracture of the first digit with first MTP joint involvement. 2. Osteopenia with degenerative changes. Electronically Signed   By: Abigail Miyamoto M.D.   On: 05/18/2022 12:21      Subjective:  She denies any complaints today, no  nausea, no vomiting, good appetite, no dyspnea, she is asking to go home today Discharge Exam: Vitals:   06/11/22 0435 06/11/22 1006  BP: (!) 141/69 (!) 140/77  Pulse: 81 83  Resp: 18 18  Temp: 98.6 F (37 C) 98.8 F (37.1 C)  SpO2: 95% 94%   Vitals:   06/10/22 1802 06/10/22 2125 06/11/22 0435 06/11/22 1006  BP: 132/64 (!) 121/51 (!) 141/69 (!) 140/77  Pulse: 71 78 81 83  Resp: _0 Temp: 98.4 F (36.9 C) 98.1 F (36.7 C) 98.6 F (37 C) 98.8 F (37.1 C)  TempSrc: Oral  Oral   SpO2: 100% 95% 95% 94%  Weight:      Height:        General: Pt is alert, awake, not in acute distress Cardiovascular: RRR, S1/S2 +, no rubs, no gallops Respiratory: Improved air entry at the bases, no wheezing Abdominal: Soft, NT, ND, bowel sounds + Extremities: no edema, no cyanosis    The results of significant diagnostics from this hospitalization (including imaging, microbiology, ancillary and laboratory) are listed below for reference.     Microbiology: Recent Results (from the past 240 hour(s))  Blood Culture (routine x 2)     Status: None   Collection Time: 06/05/22 12:25 PM   Specimen: BLOOD RIGHT ARM  Result Value Ref Range Status   Specimen Description   Final    BLOOD RIGHT ARM UPPER Performed at [facility], [address], [place], [place] [address]    Special Requests   Final    BOTTLES DRAWN AEROBIC AND ANAEROBIC Blood Culture adequate volume Performed at [facility] [address] [place], [place] [address]    Culture   Final    NO GROWTH 5 DAYS Performed at [facility], [place] [address] [place], [place] [address]    Report Status 06/10/2022 FINAL  Final  Urine Culture     Status: None   Collection Time: 06/05/22  1:19 PM   Specimen: In/Out Cath Urine  Result Value Ref Range Status   Specimen Description   Final    IN/OUT CATH URINE Performed at [facility] [address], [place], [place] [address]     Special Requests   Final    NONE Performed at [facility], [place] [address] [place], [place] [address]    Culture   Final    NO GROWTH Performed at [facility], [place] [address], [place], [place] [address]    Report Status 06/06/2022  FINAL  Final  MRSA Next Gen by PCR, Nasal     Status: None   Collection Time: 06/05/22  8:25 PM   Specimen: Nasal Mucosa; Nasal Swab  Result Value Ref Range Status   MRSA by PCR Next Gen NOT DETECTED NOT DETECTED Final    Comment: (NOTE) The GeneXpert MRSA Assay (FDA approved for NASAL specimens only), is one component of a comprehensive MRSA colonization surveillance program. It is not intended to diagnose MRSA infection nor to guide or monitor treatment for MRSA infections. Test performance is not FDA approved in patients less than 72 years old. Performed at [facility], [place] [address], [place], [place] [address]   Body fluid culture w Gram Stain     Status: None   Collection Time: 06/07/22 11:19 AM   Specimen: Pleural Fluid  Result Value Ref Range Status   Specimen Description PLEURAL  Final   Special Requests NONE  Final   Gram Stain   Final    WBC PRESENT,BOTH PMN AND MONONUCLEAR NO ORGANISMS SEEN CYTOSPIN SMEAR    Culture   Final    NO GROWTH 3 DAYS Performed at [facility], [place] [address] [place], [place] [address]    Report Status 06/10/2022 FINAL  Final     Labs: BNP (last 3 results) Recent Labs    10/16/21 1419 10/16/21 2356  BNP 92.9 93.7   Basic Metabolic Panel: Recent Labs  Lab 06/06/22 0452 06/07/22 0208 06/08/22 0647 06/09/22 0119 06/10/22 0052 06/11/22 0228  NA 142 134* 138 135 138 138  K 4.7 3.7 3.3* 3.3* 3.4* 3.1*  CL 99 96* 97* 96* 99 100  CO2 _0 GLUCOSE 103* 91 81 90 87 69*  BUN 43* _1 CREATININE 6.19* 3.69* 2.71* 3.55* 2.71* 2.81*  CALCIUM 7.6* 7.9* 7.9* 8.1* 8.1* 8.0*  PHOS 5.2* 3.7 3.0  --   --   --    Liver Function  Tests: Recent Labs  Lab 06/06/22 0451 06/06/22 0452 06/07/22 0208 06/08/22 0647 06/08/22 0648 06/09/22 0119 06/10/22 0052  AST 420*  --  665*  --  300* 177* 96*  ALT 47*  --  90*  --  57* 41 29  ALKPHOS 96  --  88  --  81 74 74  BILITOT 0.5  --  0.9  --  1.0 1.0 0.9  PROT 5.5*  --  5.7*  --  5.6* 5.7* 5.7*  ALBUMIN 2.5*   < > 2.4*  2.5* 2.3* 2.2* 2.4* 2.2*   < > = values in this interval not displayed.   No results for input(s): "LIPASE", "AMYLASE" in the last 168 hours. Recent Labs  Lab 06/05/22 2328 06/07/22 0208  AMMONIA 81* 33   CBC: Recent Labs  Lab 06/05/22 1347 06/05/22 2232 06/07/22 0208 06/08/22 0648 06/09/22 0119 06/10/22 0052 06/11/22 0228  WBC 16.1*   < > 8.3 6.4 7.0 7.5 7.4  NEUTROABS 13.8*  --   --   --   --   --   --   HGB 7.8*   < > 8.1* 8.1* 8.4* 8.8* 8.4*  HCT 26.6*   < > 26.8* 25.9* 27.2* 28.7* 27.1*  MCV 106.4*   < > 101.5* 98.9 98.6 100.7* 99.3  PLT 137*   < > 106* 102* 103* 105* 124*   < > = values in this interval not displayed.   Cardiac Enzymes: Recent Labs  Lab  06/07/22 1103  CKTOTAL 198   BNP: Invalid input(s): "POCBNP" CBG: Recent Labs  Lab 06/10/22 1758 06/10/22 1835 06/10/22 2124 06/11/22 0612 06/11/22 0733  GLUCAP 61* 88 80 71 74   D-Dimer No results for input(s): "DDIMER" in the last 72 hours. Hgb A1c No results for input(s): "HGBA1C" in the last 72 hours. Lipid Profile No results for input(s): "CHOL", "HDL", "LDLCALC", "TRIG", "CHOLHDL", "LDLDIRECT" in the last 72 hours. Thyroid function studies No results for input(s): "TSH", "T4TOTAL", "T3FREE", "THYROIDAB" in the last 72 hours.  Invalid input(s): "FREET3" Anemia work up No results for input(s): "VITAMINB12", "FOLATE", "FERRITIN", "TIBC", "IRON", "RETICCTPCT" in the last 72 hours. Urinalysis    Component Value Date/Time   COLORURINE YELLOW 06/05/2022 1319   APPEARANCEUR CLOUDY (A) 06/05/2022 1319   APPEARANCEUR Cloudy (A) 07/04/2021 1054   LABSPEC 1.016  06/05/2022 1319   PHURINE 5.0 06/05/2022 1319   GLUCOSEU NEGATIVE 06/05/2022 1319   HGBUR LARGE (A) 06/05/2022 1319   BILIRUBINUR NEGATIVE 06/05/2022 1319   BILIRUBINUR Negative 07/04/2021 1054   KETONESUR NEGATIVE 06/05/2022 1319   PROTEINUR 100 (A) 06/05/2022 1319   NITRITE NEGATIVE 06/05/2022 1319   LEUKOCYTESUR LARGE (A) 06/05/2022 1319   Sepsis Labs Recent Labs  Lab 06/08/22 0648 06/09/22 0119 06/10/22 0052 06/11/22 0228  WBC 6.4 7.0 7.5 7.4   Microbiology Recent Results (from the past 240 hour(s))  Blood Culture (routine x 2)     Status: None   Collection Time: 06/05/22 12:25 PM   Specimen: BLOOD RIGHT ARM  Result Value Ref Range Status   Specimen Description   Final    BLOOD RIGHT ARM UPPER Performed at [facility], [place] [address] [place], [place] [address]    Special Requests   Final    BOTTLES DRAWN AEROBIC AND ANAEROBIC Blood Culture adequate volume Performed at [facility] [address] [place], [place] [address]    Culture   Final    NO GROWTH 5 DAYS Performed at [facility], [place] [address], [place], [place] [address]    Report Status 06/10/2022 FINAL  Final  Urine Culture     Status: None   Collection Time: 06/05/22  1:19 PM   Specimen: In/Out Cath Urine  Result Value Ref Range Status   Specimen Description   Final    IN/OUT CATH URINE Performed at [facility] [address], [place], [place] [address]    Special Requests   Final    NONE Performed at [facility], [place] [address], [place], [place] [address]    Culture   Final    NO GROWTH Performed at [facility], [place] [address], [place], [place] [address]    Report Status 06/06/2022 FINAL  Final  MRSA Next Gen by PCR, Nasal     Status: None   Collection Time: 06/05/22  8:25 PM   Specimen: Nasal Mucosa; Nasal Swab  Result Value Ref Range Status   MRSA by PCR Next Gen NOT DETECTED NOT DETECTED Final    Comment:  (NOTE) The GeneXpert MRSA Assay (FDA approved for NASAL specimens only), is one component of a comprehensive MRSA colonization surveillance program. It is not intended to diagnose MRSA infection nor to guide or monitor treatment for MRSA infections. Test performance is not FDA approved in patients less than 51 years old. Performed at [facility], [place] [address], [place], [place] [address]   Body fluid culture w Gram Stain  Status: None   Collection Time: 06/07/22 11:19 AM   Specimen: Pleural Fluid  Result Value Ref Range Status   Specimen Description PLEURAL  Final   Special Requests NONE  Final   Gram Stain   Final    WBC PRESENT,BOTH PMN AND MONONUCLEAR NO ORGANISMS SEEN CYTOSPIN SMEAR    Culture   Final    NO GROWTH 3 DAYS Performed at [facility], [address] [place], [place] [address]    Report Status 06/10/2022 FINAL  Final     Time coordinating discharge: Over 30 minutes  SIGNED:   Phillips Climes, MD  [facility] 06/11/2022, 11:14 AM Pager   If 7PM-7AM, please contact night-coverage [email or web address]

## 2022-06-11 NOTE — Discharge Instructions (Signed)
Follow with Primary MD Marguerita Merles, MD in 7 days   Get CBC, CMP, 2 view Chest X ray checked  by Primary MD next visit.    Activity: As tolerated with Full fall precautions use walker/cane & assistance as needed   Disposition Home    Diet: Heart Healthy /renal   On your next visit with your primary care physician please Get Medicines reviewed and adjusted.   Please request your Prim.MD to go over all Hospital Tests and Procedure/Radiological results at the follow up, please get all Hospital records sent to your Prim MD by signing hospital release before you go home.   If you experience worsening of your admission symptoms, develop shortness of breath, life threatening emergency, suicidal or homicidal thoughts you must seek medical attention immediately by calling 911 or calling your MD immediately  if symptoms less severe.  You Must read complete instructions/literature along with all the possible adverse reactions/side effects for all the Medicines you take and that have been prescribed to you. Take any new Medicines after you have completely understood and accpet all the possible adverse reactions/side effects.   Do not drive, operating heavy machinery, perform activities at heights, swimming or participation in water activities or provide baby sitting services if your were admitted for syncope or siezures until you have seen by Primary MD or a Neurologist and advised to do so again.  Do not drive when taking Pain medications.    Do not take more than prescribed Pain, Sleep and Anxiety Medications  Special Instructions: If you have smoked or chewed Tobacco  in the last 2 yrs please stop smoking, stop any regular Alcohol  and or any Recreational drug use.  Wear Seat belts while driving.   Please note  You were cared for by a hospitalist during your hospital stay. If you have any questions about your discharge medications or the care you received while you were in the hospital  after you are discharged, you can call the unit and asked to speak with the hospitalist on call if the hospitalist that took care of you is not available. Once you are discharged, your primary care physician will handle any further medical issues. Please note that NO REFILLS for any discharge medications will be authorized once you are discharged, as it is imperative that you return to your primary care physician (or establish a relationship with a primary care physician if you do not have one) for your aftercare needs so that they can reassess your need for medications and monitor your lab values.

## 2022-06-11 NOTE — Progress Notes (Signed)
Occupational Therapy Treatment Patient Details Name: MONSERRATT KNEZEVIC MRN: [id number] DOB: 01/16/1941 Today's Date: 06/11/2022   History of present illness 81 y.o. F admitted on 06/05/22 due to AMS. Pt recently underwent robotic assisted laparoscopic hysterectomy, bilateral salpingo-oopherectomy, and sacrocolpopexy for pelvic organ prolapse on 8/18 PMH: DM2, HTN, ESRD, and anemia   OT comments  Pt sitting in recliner with family present upon OT arrival. Pt's family reports they will be available to assist her at d/c. Pt currently requires minguard assistance for toilet transfers, she requires moderate assistance for posterior care and minA for LB dressing. Pt with plan to d/c home today. Continue to recommend HHOT follow-up at d/c.    Recommendations for follow up therapy are one component of a multi-disciplinary discharge planning process, led by the attending physician.  Recommendations may be updated based on patient status, additional functional criteria and insurance authorization.    Follow Up Recommendations  Home health OT    Assistance Recommended at Discharge Frequent or constant Supervision/Assistance  Patient can return home with the following  A little help with walking and/or transfers;A lot of help with bathing/dressing/bathroom;Direct supervision/assist for medications management;Assistance with cooking/housework   Equipment Recommendations  BSC/3in1;Tub/shower seat    Recommendations for Other Services      Precautions / Restrictions Precautions Precautions: Fall Restrictions Weight Bearing Restrictions: No       Mobility Bed Mobility               General bed mobility comments: in recliner upon arrival    Transfers Overall transfer level: Needs assistance Equipment used: None Transfers: Sit to/from Stand Sit to Stand: Min guard           General transfer comment: minguard for safety     Balance Overall balance assessment: Needs  assistance Sitting-balance support: Single extremity supported, No upper extremity supported, Feet supported Sitting balance-Leahy Scale: Fair     Standing balance support: No upper extremity supported, During functional activity Standing balance-Leahy Scale: Fair                             ADL either performed or assessed with clinical judgement   ADL Overall ADL's : Needs assistance/impaired                     Lower Body Dressing: Sit to/from stand;Minimal assistance   Toilet Transfer: Ambulation;Min guard   Toileting- Clothing Manipulation and Hygiene: Moderate assistance;Sit to/from stand Toileting - Clothing Manipulation Details (indicate cue type and reason): assist for posterior care     Functional mobility during ADLs: Min guard General ADL Comments: family will assist prn upon d/c    Extremity/Trunk Assessment Upper Extremity Assessment Upper Extremity Assessment: Generalized weakness   Lower Extremity Assessment Lower Extremity Assessment: Generalized weakness        Vision   Vision Assessment?: No apparent visual deficits   Perception     Praxis      Cognition Arousal/Alertness: Awake/alert Behavior During Therapy: Flat affect Overall Cognitive Status:  (NT formally, improved attention, following commands today) Area of Impairment: Memory, Following commands, Safety/judgement, Awareness, Problem solving                     Memory: Decreased short-term memory Following Commands: Follows one step commands with increased time Safety/Judgement: Decreased awareness of safety, Decreased awareness of deficits Awareness: Intellectual Problem Solving: Slow processing, Requires verbal cues General Comments: Requiring increased time,  and verbal cuing, unaware of BM, sticking her hands in it to check.        Exercises      Shoulder Instructions       General Comments vss on RA, pt's family present during session     Pertinent Vitals/ Pain       Pain Assessment Pain Assessment: No/denies pain Pain Intervention(s): Limited activity within patient's tolerance, Monitored during session  Home Living                                          Prior Functioning/Environment              Frequency  Min 2X/week        Progress Toward Goals  OT Goals(current goals can now be found in the care plan section)  Progress towards OT goals: Progressing toward goals  Acute Rehab OT Goals Patient Stated Goal: to go home OT Goal Formulation: With patient Time For Goal Achievement: 06/22/22 Potential to Achieve Goals: Good ADL Goals Pt Will Perform Grooming: with modified independence;standing Pt Will Perform Lower Body Bathing: with modified independence;sitting/lateral leans;sit to/from stand Pt Will Perform Lower Body Dressing: with modified independence;sitting/lateral leans;sit to/from stand Pt Will Transfer to Toilet: with modified independence;ambulating Pt Will Perform Toileting - Clothing Manipulation and hygiene: with modified independence;sitting/lateral leans;sit to/from stand  Plan Discharge plan remains appropriate    Co-evaluation                 AM-PAC OT "6 Clicks" Daily Activity     Outcome Measure   Help from another person eating meals?: A Little Help from another person taking care of personal grooming?: A Little Help from another person toileting, which includes using toliet, bedpan, or urinal?: A Little Help from another person bathing (including washing, rinsing, drying)?: A Lot Help from another person to put on and taking off regular upper body clothing?: A Little Help from another person to put on and taking off regular lower body clothing?: A Little 6 Click Score: 17    End of Session Equipment Utilized During Treatment: Gait belt  OT Visit Diagnosis: Unsteadiness on feet (R26.81);Other abnormalities of gait and mobility (R26.89);Muscle  weakness (generalized) (M62.81)   Activity Tolerance Patient tolerated treatment well   Patient Left in chair;with call bell/phone within reach;with chair alarm set;with family/visitor present   Nurse Communication Mobility status        Time: 8469-6295 OT Time Calculation (min): 14 min  Charges: OT General Charges $OT Visit: 1 Visit OT Treatments $Self Care/Home Management : 8-22 mins  Helene Kelp OTR/L Acute Rehabilitation Services Office: [place] 06/11/2022, 3:02 PM

## 2022-06-11 NOTE — TOC Transition Note (Signed)
Transition of Care [facility]) - CM/SW Discharge Note   Patient Details  Name: Krista Gordon MRN: [id number] Date of Birth: 12-Sep-1941  [facility]) CM/SW Contact:  Bartholomew Crews, RN Phone Number: [phone] 06/11/2022, 1:37 PM   Clinical Narrative:     Patient to transition home today. Verified with Jasmine at [place] that [facility] was delivered to the room, and that tub bench will be shipped to patient's home. [place] notified of discharge today and [place] PTOT orders in place. No further TOC needs identified at this time.   Final next level of care: [place] Barriers to Discharge: No Barriers Identified   Patient Goals and CMS Choice Patient states their goals for this hospitalization and ongoing recovery are:: To return home CMS Medicare.gov Compare Post Acute Care list provided to:: Patient Choice offered to / list presented to : Patient  Discharge Placement                       Discharge Plan and Services   Discharge Planning Services: CM Consult Post Acute Care Choice: Home Health          DME Arranged: 3-N-1, Tub bench DME Agency: [employer] Date DME Agency Contacted: 06/11/22 Time DME Agency Contacted: [id number] Representative spoke with at DME Agency: [place]: PT, OT [place] Agency: [place] Date [place]: 06/11/22 Time [place]: [id number] Representative spoke with at [place]: [place] Determinants of Health ([place]) Interventions     Readmission Risk Interventions     No data to display

## 2022-06-12 NOTE — Progress Notes (Signed)
Late Note Entry:  Pt was d/c to home yesterday. [place] and spoke to [place]. Clinic advised pt was d/c yesterday and will resume care tomorrow. D/C summary and last renal note faxed to clinic for continuation of care.   Melven Sartorius Renal Navigator [phone]

## 2022-06-13 ENCOUNTER — Observation Stay
Admission: EM | Admit: 2022-06-13 | Discharge: 2022-06-14 | Disposition: A | Discharge status: Home Health | Payer: Medicare Other | Attending: Internal Medicine | Admitting: Internal Medicine

## 2022-06-13 ENCOUNTER — Emergency Department: Payer: Medicare Other

## 2022-06-13 ENCOUNTER — Other Ambulatory Visit: Payer: Self-pay

## 2022-06-13 DIAGNOSIS — Z7901 Long term (current) use of anticoagulants: Secondary | ICD-10-CM | POA: Insufficient documentation

## 2022-06-13 DIAGNOSIS — J9621 Acute and chronic respiratory failure with hypoxia: Secondary | ICD-10-CM | POA: Insufficient documentation

## 2022-06-13 DIAGNOSIS — Z8616 Personal history of COVID-19: Secondary | ICD-10-CM | POA: Insufficient documentation

## 2022-06-13 DIAGNOSIS — R269 Unspecified abnormalities of gait and mobility: Secondary | ICD-10-CM | POA: Insufficient documentation

## 2022-06-13 DIAGNOSIS — R262 Difficulty in walking, not elsewhere classified: Secondary | ICD-10-CM | POA: Diagnosis not present

## 2022-06-13 DIAGNOSIS — Z79899 Other long term (current) drug therapy: Secondary | ICD-10-CM | POA: Insufficient documentation

## 2022-06-13 DIAGNOSIS — E1122 Type 2 diabetes mellitus with diabetic chronic kidney disease: Secondary | ICD-10-CM | POA: Insufficient documentation

## 2022-06-13 DIAGNOSIS — I480 Paroxysmal atrial fibrillation: Secondary | ICD-10-CM | POA: Diagnosis not present

## 2022-06-13 DIAGNOSIS — Z87891 Personal history of nicotine dependence: Secondary | ICD-10-CM | POA: Insufficient documentation

## 2022-06-13 DIAGNOSIS — Z7982 Long term (current) use of aspirin: Secondary | ICD-10-CM | POA: Diagnosis not present

## 2022-06-13 DIAGNOSIS — N281 Cyst of kidney, acquired: Secondary | ICD-10-CM | POA: Diagnosis not present

## 2022-06-13 DIAGNOSIS — I120 Hypertensive chronic kidney disease with stage 5 chronic kidney disease or end stage renal disease: Secondary | ICD-10-CM | POA: Insufficient documentation

## 2022-06-13 DIAGNOSIS — E440 Moderate protein-calorie malnutrition: Secondary | ICD-10-CM | POA: Insufficient documentation

## 2022-06-13 DIAGNOSIS — R531 Weakness: Secondary | ICD-10-CM

## 2022-06-13 DIAGNOSIS — Z992 Dependence on renal dialysis: Secondary | ICD-10-CM | POA: Diagnosis not present

## 2022-06-13 DIAGNOSIS — R197 Diarrhea, unspecified: Principal | ICD-10-CM | POA: Diagnosis present

## 2022-06-13 DIAGNOSIS — J9601 Acute respiratory failure with hypoxia: Secondary | ICD-10-CM

## 2022-06-13 DIAGNOSIS — N186 End stage renal disease: Secondary | ICD-10-CM | POA: Insufficient documentation

## 2022-06-13 DIAGNOSIS — I700 Atherosclerosis of aorta: Secondary | ICD-10-CM | POA: Insufficient documentation

## 2022-06-13 DIAGNOSIS — K5730 Diverticulosis of large intestine without perforation or abscess without bleeding: Secondary | ICD-10-CM | POA: Diagnosis not present

## 2022-06-13 DIAGNOSIS — J9611 Chronic respiratory failure with hypoxia: Secondary | ICD-10-CM | POA: Diagnosis present

## 2022-06-13 DIAGNOSIS — Z20822 Contact with and (suspected) exposure to covid-19: Secondary | ICD-10-CM | POA: Diagnosis not present

## 2022-06-13 DIAGNOSIS — K521 Toxic gastroenteritis and colitis: Secondary | ICD-10-CM

## 2022-06-13 DIAGNOSIS — J90 Pleural effusion, not elsewhere classified: Secondary | ICD-10-CM | POA: Diagnosis not present

## 2022-06-13 LAB — CBC WITH DIFFERENTIAL/PLATELET
Abs Immature Granulocytes: 0.03 10*3/uL (ref 0.00–0.07)
Basophils Absolute: 0 10*3/uL (ref 0.0–0.1)
Basophils Relative: 1 %
Eosinophils Absolute: 0.2 10*3/uL (ref 0.0–0.5)
Eosinophils Relative: 3 %
HCT: 32.2 % — ABNORMAL LOW (ref 36.0–46.0)
Hemoglobin: 9.6 g/dL — ABNORMAL LOW (ref 12.0–15.0)
Immature Granulocytes: 0 %
Lymphocytes Relative: 21 %
Lymphs Abs: 1.5 10*3/uL (ref 0.7–4.0)
MCH: 30.4 pg (ref 26.0–34.0)
MCHC: 29.8 g/dL — ABNORMAL LOW (ref 30.0–36.0)
MCV: 101.9 fL — ABNORMAL HIGH (ref 80.0–100.0)
Monocytes Absolute: 0.5 10*3/uL (ref 0.1–1.0)
Monocytes Relative: 7 %
Neutro Abs: 5 10*3/uL (ref 1.7–7.7)
Neutrophils Relative %: 68 %
Platelets: 189 10*3/uL (ref 150–400)
RBC: 3.16 MIL/uL — ABNORMAL LOW (ref 3.87–5.11)
RDW: 20.3 % — ABNORMAL HIGH (ref 11.5–15.5)
WBC: 7.3 10*3/uL (ref 4.0–10.5)
nRBC: 0 % (ref 0.0–0.2)

## 2022-06-13 LAB — GASTROINTESTINAL PANEL BY PCR, STOOL (REPLACES STOOL CULTURE)

## 2022-06-13 LAB — LACTIC ACID, PLASMA
Lactic Acid, Venous: 1.1 mmol/L (ref 0.5–1.9)
Lactic Acid, Venous: 0.9 mmol/L (ref 0.5–1.9)

## 2022-06-13 LAB — CBC
HCT: 32.2 % — ABNORMAL LOW (ref 36.0–46.0)
Hemoglobin: 9.6 g/dL — ABNORMAL LOW (ref 12.0–15.0)
MCH: 30.3 pg (ref 26.0–34.0)
MCHC: 29.8 g/dL — ABNORMAL LOW (ref 30.0–36.0)
MCV: 101.6 fL — ABNORMAL HIGH (ref 80.0–100.0)
Platelets: 221 10*3/uL (ref 150–400)
RBC: 3.17 MIL/uL — ABNORMAL LOW (ref 3.87–5.11)
RDW: 20.2 % — ABNORMAL HIGH (ref 11.5–15.5)
WBC: 7.2 10*3/uL (ref 4.0–10.5)
nRBC: 0 % (ref 0.0–0.2)

## 2022-06-13 LAB — HEPATIC FUNCTION PANEL
ALT: 14 U/L (ref 0–44)
AST: 33 U/L (ref 15–41)
Albumin: 2.6 g/dL — ABNORMAL LOW (ref 3.5–5.0)
Alkaline Phosphatase: 71 U/L (ref 38–126)
Bilirubin, Direct: 0.2 mg/dL (ref 0.0–0.2)
Indirect Bilirubin: 0.8 mg/dL (ref 0.3–0.9)
Total Bilirubin: 1 mg/dL (ref 0.3–1.2)
Total Protein: 6.2 g/dL — ABNORMAL LOW (ref 6.5–8.1)

## 2022-06-13 LAB — C DIFFICILE QUICK SCREEN W PCR REFLEX
C Diff antigen: NEGATIVE
C Diff interpretation: NOT DETECTED
C Diff toxin: NEGATIVE

## 2022-06-13 LAB — BASIC METABOLIC PANEL
Anion gap: 9 (ref 5–15)
BUN: 25 mg/dL — ABNORMAL HIGH (ref 8–23)
CO2: 25 mmol/L (ref 22–32)
Calcium: 8.5 mg/dL — ABNORMAL LOW (ref 8.9–10.3)
Chloride: 105 mmol/L (ref 98–111)
Creatinine, Ser: 4.74 mg/dL — ABNORMAL HIGH (ref 0.44–1.00)
GFR, Estimated: 9 mL/min — ABNORMAL LOW (ref >60)
Glucose, Bld: 86 mg/dL (ref 70–99)
Potassium: 4.8 mmol/L (ref 3.5–5.1)
Sodium: 139 mmol/L (ref 135–145)

## 2022-06-13 LAB — SARS CORONAVIRUS 2 BY RT PCR: SARS Coronavirus 2 by RT PCR: NEGATIVE

## 2022-06-13 MED ORDER — ANTICOAGULANT SODIUM CITRATE 4% (200MG/5ML) IV SOLN: 5.0000 mL | Status: DC | PRN

## 2022-06-13 MED ORDER — PANTOPRAZOLE SODIUM 40 MG PO TBEC
40.0000 mg | DELAYED_RELEASE_TABLET | Freq: Every day | ORAL | Status: DC
  Administered 2022-06-13 – 2022-06-14 (×2): 40 mg via ORAL
  Filled 2022-06-13 (×2): qty 1

## 2022-06-13 MED ORDER — DRONABINOL 2.5 MG PO CAPS
2.5000 mg | ORAL_CAPSULE | Freq: Every day | ORAL | Status: DC
Start: 2022-06-13 — End: 2022-06-14
  Administered 2022-06-13 – 2022-06-14 (×2): 2.5 mg via ORAL
  Filled 2022-06-13 (×2): qty 1

## 2022-06-13 MED ORDER — LOPERAMIDE HCL 2 MG PO CAPS
2.0000 mg | ORAL_CAPSULE | ORAL | Status: DC | PRN
Start: 2022-06-13 — End: 2022-06-14

## 2022-06-13 MED ORDER — CHLORHEXIDINE GLUCONATE CLOTH 2 % EX PADS
6.0000 | MEDICATED_PAD | Freq: Every day | CUTANEOUS | Status: DC
  Administered 2022-06-14: 6 via TOPICAL
  Filled 2022-06-13: qty 6

## 2022-06-13 MED ORDER — ASPIRIN 81 MG PO TBEC
81.0000 mg | DELAYED_RELEASE_TABLET | Freq: Every day | ORAL | Status: DC
  Administered 2022-06-13 – 2022-06-14 (×2): 81 mg via ORAL
  Filled 2022-06-13 (×2): qty 1

## 2022-06-13 MED ORDER — ALBUTEROL SULFATE (2.5 MG/3ML) 0.083% IN NEBU: 3.0000 mL | INHALATION_SOLUTION | Freq: Four times a day (QID) | RESPIRATORY_TRACT | Status: DC | PRN

## 2022-06-13 MED ORDER — COLCHICINE 0.6 MG PO TABS
0.6000 mg | ORAL_TABLET | ORAL | Status: DC
  Administered 2022-06-14: 0.6 mg via ORAL
  Filled 2022-06-13: qty 1

## 2022-06-13 MED ORDER — SODIUM BICARBONATE 650 MG PO TABS
1300.0000 mg | ORAL_TABLET | Freq: Two times a day (BID) | ORAL | Status: DC
  Administered 2022-06-13 – 2022-06-14 (×2): 1300 mg via ORAL
  Filled 2022-06-13 (×3): qty 2

## 2022-06-13 MED ORDER — RISAQUAD PO CAPS
2.0000 | ORAL_CAPSULE | Freq: Three times a day (TID) | ORAL | Status: DC
  Administered 2022-06-13 – 2022-06-14 (×2): 2 via ORAL
  Filled 2022-06-13 (×2): qty 2

## 2022-06-13 MED ORDER — HEPARIN SODIUM (PORCINE) 5000 UNIT/ML IJ SOLN
5000.0000 [IU] | Freq: Two times a day (BID) | INTRAMUSCULAR | Status: DC
  Administered 2022-06-13 – 2022-06-14 (×2): 5000 [IU] via SUBCUTANEOUS
  Filled 2022-06-13 (×2): qty 1

## 2022-06-13 MED ORDER — ATORVASTATIN CALCIUM 20 MG PO TABS
40.0000 mg | ORAL_TABLET | Freq: Every day | ORAL | Status: DC
  Administered 2022-06-13: 40 mg via ORAL
  Filled 2022-06-13: qty 2

## 2022-06-13 MED ORDER — IPRATROPIUM-ALBUTEROL 0.5-2.5 (3) MG/3ML IN SOLN
3.0000 mL | Freq: Four times a day (QID) | RESPIRATORY_TRACT | Status: DC
Start: 2022-06-13 — End: 2022-06-14
  Administered 2022-06-13 – 2022-06-14 (×3): 3 mL via RESPIRATORY_TRACT
  Filled 2022-06-13 (×3): qty 3

## 2022-06-13 MED ORDER — HEPARIN SODIUM (PORCINE) 1000 UNIT/ML DIALYSIS: 1000.0000 [IU] | INTRAMUSCULAR | Status: DC | PRN

## 2022-06-13 MED ORDER — DOXYCYCLINE HYCLATE 100 MG PO TABS
100.0000 mg | ORAL_TABLET | Freq: Two times a day (BID) | ORAL | Status: DC
  Administered 2022-06-13 – 2022-06-14 (×2): 100 mg via ORAL
  Filled 2022-06-13 (×2): qty 1

## 2022-06-13 MED ORDER — ALTEPLASE 2 MG IJ SOLR
2.0000 mg | Freq: Once | INTRAMUSCULAR | Status: DC | PRN
Start: 2022-06-13 — End: 2022-06-14

## 2022-06-13 MED ORDER — ONDANSETRON HCL 4 MG/2ML IJ SOLN: 4.0000 mg | Freq: Four times a day (QID) | INTRAMUSCULAR | Status: DC | PRN

## 2022-06-13 MED ORDER — ENSURE ENLIVE PO LIQD
237.0000 mL | Freq: Two times a day (BID) | ORAL | Status: DC
Start: 2022-06-14 — End: 2022-06-14
  Administered 2022-06-14: 237 mL via ORAL

## 2022-06-13 MED ORDER — ONDANSETRON HCL 4 MG PO TABS: 4.0000 mg | ORAL_TABLET | Freq: Four times a day (QID) | ORAL | Status: DC | PRN

## 2022-06-13 MED ORDER — PENTAFLUOROPROP-TETRAFLUOROETH EX AERO: 1.0000 | INHALATION_SPRAY | CUTANEOUS | Status: DC | PRN

## 2022-06-13 MED ORDER — AMLODIPINE BESYLATE 5 MG PO TABS
5.0000 mg | ORAL_TABLET | Freq: Every day | ORAL | Status: DC
  Administered 2022-06-13 – 2022-06-14 (×2): 5 mg via ORAL
  Filled 2022-06-13 (×2): qty 1

## 2022-06-13 MED ORDER — LIDOCAINE-PRILOCAINE 2.5-2.5 % EX CREA: 1.0000 | TOPICAL_CREAM | CUTANEOUS | Status: DC | PRN

## 2022-06-13 MED ORDER — LIDOCAINE HCL (PF) 1 % IJ SOLN: 5.0000 mL | INTRAMUSCULAR | Status: DC | PRN

## 2022-06-13 MED ORDER — SENNOSIDES-DOCUSATE SODIUM 8.6-50 MG PO TABS: 1.0000 | ORAL_TABLET | Freq: Every evening | ORAL | Status: DC | PRN

## 2022-06-13 NOTE — Progress Notes (Signed)
Pre HD RN assessment 

## 2022-06-13 NOTE — ED Notes (Signed)
Pt. Linens all changed. Pt. Changed into gown. Repositioned for comfort.

## 2022-06-13 NOTE — ED Notes (Signed)
Multiple failed IV attempts, IV team requested.

## 2022-06-13 NOTE — ED Notes (Signed)
ED Provider at bedside. 

## 2022-06-13 NOTE — ED Notes (Signed)
Pt. Placed on BP and pulse ox monitoring.

## 2022-06-13 NOTE — H&P (Signed)
History and Physical    Krista Gordon WGN:[id number] DOB: [birth year] DOA: 06/13/2022  PCP: Krista Merles, MD (Confirm with patient/family/NH records and if not entered, this has to be entered at [facility] point of entry) Patient coming from: Home  I have personally briefly reviewed patient's old medical records in [place]  Chief Complaint: Worsening of diarrhea, generalized weakness  HPI: Krista Gordon is a 81 y.o. female with medical history significant of ESRD on HD, anemia secondary to CKD, IIDM, chronic pulmonary fibrosis undiagnosed, severe pulmonary hypertension, sent by family member for evaluation of worsening of diarrhea and generalized weakness.  Patient was hospitalized last week for pneumonia and mentation changes.  Pneumonia was treated with Zosyn and mentation also improved.  And patient was been off oxygen and sent home with Augmentin.  However, since yesterday, patient developed a worsening of loose diarrhea, dark green-colored, smelly, no abdominal pain no fever or chills.  5-6 episodes a day.  Today, patient woke up from sitting significant weakness, generalized, bilateral, nonfocal, very weak and unable to standing even on her feet.  ED Course: None tachycardia no hypotensive, O2 saturation 91% on room air and stabilized on 2 L.  CT abdomen pelvis no significantly.  Stool studies C. difficile negative.  WBC 7.3.  Review of Systems: As per HPI otherwise 14 point review of systems negative.    Past Medical History:  Diagnosis Date   Anemia    COVID    patient states had COVID "over a month ago and in hospital 3 days"   Diabetes mellitus without complication ([place])    ESRD on hemodialysis ([place])    kidney disease   Gall stone    Gout    Hyperlipidemia    Hypertension     Past Surgical History:  Procedure Laterality Date   A/V FISTULAGRAM Left 12/05/2021   Procedure: A/V Fistulagram;  Surgeon: Algernon Huxley, MD;  Location: [facility];  Service:  Cardiovascular;  Laterality: Left;   A/V SHUNTOGRAM Left 02/16/2020   Procedure: A/V SHUNTOGRAM;  Surgeon: Algernon Huxley, MD;  Location: [facility];  Service: Cardiovascular;  Laterality: Left;   AV FISTULA PLACEMENT Left 01/15/2020   Procedure: INSERTION OF ARTERIOVENOUS (AV) GORE-TEX GRAFT ARM;  Surgeon: Algernon Huxley, MD;  Location: [facility] ORS;  Service: Vascular;  Laterality: Left;   COLONOSCOPY WITH PROPOFOL N/A 01/27/2016   Procedure: COLONOSCOPY WITH PROPOFOL;  Surgeon: Hulen Luster, MD;  Location: [facility] ENDOSCOPY;  Service: Gastroenterology;  Laterality: N/A;   ROBOTIC ASSISTED LAPAROSCOPIC SACROCOLPOPEXY Bilateral 06/02/2022   Procedure: XI ROBOTIC ASSISTED LAPAROSCOPIC SACROCOLPOPEXY SUPRACEVICAL HYSTERECTOMY AND BILATERAL SALPINGO OOPHERECTOMY;  Surgeon: Ardis Hughs, MD;  Location: [facility] ORS;  Service: Urology;  Laterality: Bilateral;  4.5 HRS FOR THIS CASE   TUBAL LIGATION       reports that she has quit smoking. She has never used smokeless tobacco. She reports that she does not drink alcohol and does not use drugs.  Allergies  Allergen Reactions   Mircera [Methoxy Polyethylene Glycol-Epoetin Beta]     Patient doesn't recall this allergy   Tramadol Itching    Family History  Problem Relation Age of Onset   Breast cancer Sister 25     Prior to Admission medications   Medication Sig Start Date End Date Taking? Authorizing Provider  amLODipine (NORVASC) 5 MG tablet Take 5 mg by mouth daily. 01/02/22   [provider]  amoxicillin-clavulanate (AUGMENTIN) 500-125 MG tablet Take 1  tablet (500 mg total) by mouth at bedtime. 06/11/22   Elgergawy, Silver Huguenin, MD  aspirin EC 81 MG tablet Take 1 tablet (81 mg total) by mouth daily. Swallow whole. 06/11/22   Elgergawy, Silver Huguenin, MD  atorvastatin (LIPITOR) 40 MG tablet Take 40 mg by mouth at bedtime.     [provider]  colchicine 0.6 MG tablet Take 0.6 mg by mouth every other day.    [provider]   estradiol (ESTRACE VAGINAL) 0.1 MG/GM vaginal cream Place 1 Applicatorful vaginally 3 (three) times a week. Use 1 small dolyp of cream on tip of index finger and swap the inside of the vagina 06/02/22   Ardis Hughs, MD  famotidine (PEPCID) 10 MG tablet Take 1 tablet (10 mg total) by mouth daily. Patient not taking: Reported on 05/15/2022 10/20/21 12/05/21  Krista Memos, DO  multivitamin (RENA-VIT) TABS tablet Take 1 tablet by mouth at bedtime. Patient not taking: Reported on 05/15/2022 10/20/21   Krista Memos, DO  mupirocin ointment (BACTROBAN) 2 % Apply topically 2 (two) times daily. Patient not taking: Reported on 05/15/2022 10/20/21   Krista Memos, DO  pantoprazole (PROTONIX) 40 MG tablet Take 1 tablet (40 mg total) by mouth daily. 06/11/22 08/10/22  Elgergawy, Silver Huguenin, MD  PROAIR HFA 108 [id number] Base) MCG/ACT inhaler Inhale 2 puffs into the lungs every 6 (six) hours as needed for shortness of breath. 06/15/18   [provider]  sodium bicarbonate 650 MG tablet Take 1,300 mg by mouth 2 (two) times daily.    [provider]    Physical Exam: Vitals:   06/13/22 1102 06/13/22 1115 06/13/22 1125 06/13/22 1150  BP:    (!) 139/59  Pulse:  75 85 80  Resp:    18  Temp:    98 F (36.7 C)  TempSrc:    Oral  SpO2: 91% 100% 100% 100%  Weight:      Height:        Constitutional: NAD, calm, comfortable Vitals:   06/13/22 1102 06/13/22 1115 06/13/22 1125 06/13/22 1150  BP:    (!) 139/59  Pulse:  75 85 80  Resp:    18  Temp:    98 F (36.7 C)  TempSrc:    Oral  SpO2: 91% 100% 100% 100%  Weight:      Height:       Eyes: PERRL, lids and conjunctivae normal ENMT: Mucous membranes are moist. Posterior pharynx clear of any exudate or lesions.Normal dentition.  Neck: normal, supple, no masses, no thyromegaly Respiratory: clear to auscultation bilaterally, no wheezing, no crackles. Normal respiratory effort. No accessory muscle use.  Cardiovascular: Regular rate and rhythm,  no murmurs / rubs / gallops. No extremity edema. 2+ pedal pulses. No carotid bruits.  Abdomen: no tenderness, no masses palpated. No hepatosplenomegaly. Bowel sounds positive.  Musculoskeletal: no clubbing / cyanosis. No joint deformity upper and lower extremities. Good ROM, no contractures. Normal muscle tone.  Skin: no rashes, lesions, ulcers. No induration Neurologic: CN 2-12 grossly intact. Sensation intact, DTR normal. Strength 5/5 in all 4.  Psychiatric: Normal judgment and insight. Alert and oriented x 3. Normal mood.     Labs on Admission: I have personally reviewed following labs and imaging studies  CBC: Recent Labs  Lab 06/09/22 0119 06/10/22 0052 06/11/22 0228 06/13/22 0713 06/13/22 0718  WBC 7.0 7.5 7.4 7.3 7.2  NEUTROABS  --   --   --  5.0  --  HGB 8.4* 8.8* 8.4* 9.6* 9.6*  HCT 27.2* 28.7* 27.1* 32.2* 32.2*  MCV 98.6 100.7* 99.3 101.9* 101.6*  PLT 103* 105* 124* 189 244   Basic Metabolic Panel: Recent Labs  Lab 06/07/22 0208 06/08/22 0647 06/09/22 0119 06/10/22 0052 06/11/22 0228 06/13/22 0718  NA 134* 138 135 138 138 139  K 3.7 3.3* 3.3* 3.4* 3.1* 4.8  CL 96* 97* 96* 99 100 105  CO2 '29 30 27 30 27 25  '$ GLUCOSE 91 81 90 87 69* 86  BUN '18 11 18 12 9 '$ 25*  CREATININE 3.69* 2.71* 3.55* 2.71* 2.81* 4.74*  CALCIUM 7.9* 7.9* 8.1* 8.1* 8.0* 8.5*  PHOS 3.7 3.0  --   --   --   --    GFR: Estimated Creatinine Clearance: 8.4 mL/min (A) (by C-G formula based on SCr of 4.74 mg/dL (H)). Liver Function Tests: Recent Labs  Lab 06/07/22 0208 06/08/22 0647 06/08/22 0648 06/09/22 0119 06/10/22 0052  AST 665*  --  300* 177* 96*  ALT 90*  --  57* 41 29  ALKPHOS 88  --  81 74 74  BILITOT 0.9  --  1.0 1.0 0.9  PROT 5.7*  --  5.6* 5.7* 5.7*  ALBUMIN 2.4*  2.5* 2.3* 2.2* 2.4* 2.2*   No results for input(s): "LIPASE", "AMYLASE" in the last 168 hours. Recent Labs  Lab 06/07/22 0208  AMMONIA 33   Coagulation Profile: Recent Labs  Lab 06/07/22 1103  INR 1.5*    Cardiac Enzymes: Recent Labs  Lab 06/07/22 1103  CKTOTAL 198   BNP (last 3 results) No results for input(s): "PROBNP" in the last 8760 hours. HbA1C: No results for input(s): "HGBA1C" in the last 72 hours. CBG: Recent Labs  Lab 06/10/22 1835 06/10/22 2124 06/11/22 0612 06/11/22 0733 06/11/22 1133  GLUCAP 88 80 71 74 71   Lipid Profile: No results for input(s): "CHOL", "HDL", "LDLCALC", "TRIG", "CHOLHDL", "LDLDIRECT" in the last 72 hours. Thyroid Function Tests: No results for input(s): "TSH", "T4TOTAL", "FREET4", "T3FREE", "THYROIDAB" in the last 72 hours. Anemia Panel: No results for input(s): "VITAMINB12", "FOLATE", "FERRITIN", "TIBC", "IRON", "RETICCTPCT" in the last 72 hours. Urine analysis:    Component Value Date/Time   COLORURINE YELLOW 06/05/2022 1319   APPEARANCEUR CLOUDY (A) 06/05/2022 1319   APPEARANCEUR Cloudy (A) 07/04/2021 1054   LABSPEC 1.016 06/05/2022 1319   PHURINE 5.0 06/05/2022 1319   GLUCOSEU NEGATIVE 06/05/2022 1319   HGBUR LARGE (A) 06/05/2022 1319   BILIRUBINUR NEGATIVE 06/05/2022 1319   BILIRUBINUR Negative 07/04/2021 1054   KETONESUR NEGATIVE 06/05/2022 1319   PROTEINUR 100 (A) 06/05/2022 1319   NITRITE NEGATIVE 06/05/2022 1319   LEUKOCYTESUR LARGE (A) 06/05/2022 1319    Radiological Exams on Admission: CT ABDOMEN PELVIS WO CONTRAST  Result Date: 06/13/2022 CLINICAL DATA:  Acute nonlocalized abdominal pain, diarrhea for 3 days, weakness EXAM: CT ABDOMEN AND PELVIS WITHOUT CONTRAST TECHNIQUE: Multidetector CT imaging of the abdomen and pelvis was performed following the standard protocol without IV contrast. RADIATION DOSE REDUCTION: This exam was performed according to the departmental dose-optimization program which includes automated exposure control, adjustment of the mA and/or kV according to patient size and/or use of iterative reconstruction technique. COMPARISON:  06/05/2022 FINDINGS: Lower chest: Bibasilar chronic interstitial lung  disease changes and honeycomb formation. Small BILATERAL pleural effusions. No discrete pulmonary mass. Hepatobiliary: Gallbladder surgically absent. Liver normal appearance. Pancreas: Normal appearance Spleen: Normal appearance Adrenals/Urinary Tract: Adrenal glands and LEFT kidney normal appearance. Markedly hypoplastic/atretic RIGHT kidney with a complicated  cyst 3.8 x 3.7 cm image 21 containing mural calcification. Additional parenchymal calcification at upper pole RIGHT kidney. No hydronephrosis, hydroureter, or bladder abnormality. Stomach/Bowel: Appendix not visualized. Scattered colonic diverticulosis greatest at sigmoid colon. No definite pericolic inflammatory changes to suggest acute diverticulitis. Stomach decompressed. Small bowel loops unremarkable. Vascular/Lymphatic: Atherosclerotic calcifications aorta and iliac arteries without aneurysm. Few scattered pelvic phleboliths. No adenopathy. Reproductive: Post hysterectomy.  Nonvisualization of ovaries. Other: Small amount of free fluid in pelvis. No free air. No hernia. Small focus of soft tissue gas within anterior abdominal wall likely related to recent surgery, significantly decreased from previous exam. No focal fluid collection/abscess. Musculoskeletal: Osseous structures unremarkable. IMPRESSION: Colonic diverticulosis without evidence of diverticulitis. Small amount of nonspecific free fluid in pelvis. Bibasilar chronic interstitial lung disease changes and honeycomb formation with small BILATERAL pleural effusions. Complex RIGHT renal cyst 3.8 x 3.7 cm containing a mural calcification, stable since 01/06/2019; based on stability, no follow-up imaging recommended. Aortic Atherosclerosis (ICD10-I70.0). Electronically Signed   By: Lavonia Dana M.D.   On: 06/13/2022 08:51    EKG: Independently reviewed.  Sinus, no acute ST changes.  Assessment/Plan Principal Problem:   Impaired ambulation  (please populate well all problems here in Problem  List. (For example, if patient is on BP meds at home and you resume or decide to hold them, it is a problem that needs to be her. Same for CAD, COPD, HLD and so on)  Acute impaired ambulation -Deconditioning from recent hospitalization and diarrhea, PT evaluation.  Acute, probably on chronic hypoxic respiratory failure -Was diagnosed with pulmonary fibrosis on imaging study on last admission however was never formally diagnosed with interstitial lung disease and never evaluated by pulmonary before. -Today's x-ray and CT abdomen pelvis did not show significant worsening from image study on last admission.  Suspect she also has a decompensated pulmonary fibrosis during the recent pneumonia process.  We will give her bronchodilator every 6 hours, and switch antibiotics from Augmentin to doxycycline, if after optimization of her treatment, O2 saturation remains low, consider home O2 evaluation.  Also check ambulatory pulse ox every shift.  Acute diarrhea -CT ruled out.  Change antibiotics from Augmentin to doxycycline -Imodium as needed and probiotics.  Severe pulmonary hypertension -Signs of cor pulmonale with image study on last admission showed bilateral pleural effusion.  Clinically patient appears to be euvolemic, will undergo dialysis today.  Hold off Lasix. -Discussed with daughter regarding diagnosis of pulmonary fibrosis and pulmonary hypertension, patient will need outpatient pulmonary function test and pulmonary consultation outpatient.  Family agreed.  ESRD on HD -HD today  PAF -Was considered to related to pneumonia on last admission, resolved no recurrent so far.  On aspirin.  Chronic hypoalbuminemia -Continue about nutrition status family reported more than 10 pounds weight loss in this year.  Might related to severe pulmonary hypertension.  Discussed with patient family, agreed with Marinol trial.  Moderate protein calorie malnutrition -As above  DVT prophylaxis: Heparin  subcu Code Status: Full code Family Communication: Daughter at bedside Disposition Plan: Expect less than 2 midnight hospital stay Consults called: Nephrology Admission status: MedSurg observation   Lequita Halt MD [facility] Pager [phone]  06/13/2022, 12:48 PM

## 2022-06-13 NOTE — ED Notes (Signed)
See triage note  Presents with diarrhea  States she was discharged from the hospital on Sunday  Was placed on Augmentin    Afebrile on arrival

## 2022-06-13 NOTE — ED Provider Notes (Addendum)
[facility] Provider Note    Event Date/Time   First MD Initiated Contact with Patient 06/13/22 [phone]     (approximate)   History   Diarrhea   HPI  Krista Gordon is a 81 y.o. female with extensive past medical history including hypertension, ESRD, recent admission for sepsis secondary to pneumonia, here with generalized weakness and diarrhea.  The patient was just discharged from [facility].  She was admitted for pneumonia and placed on IV Zosyn.  She had initial improvement and was sent home.  Since returning home, she has had progressively worsening generalized weakness as well as profuse, watery, foul-smelling diarrhea.  This has been particularly bad over the last 48 hours.  She had difficulty eating and drinking due to loss of appetite.  She feels generally weak and had difficulty even getting out of bed today.  She is due for dialysis today.  She has been taking her antibiotics as prescribed.  Denies any fevers.  Denies any overt abdominal pain, only some mild discomfort when having a bowel movement.  She has gone through 6-7 depends today.  No other complaints.  No history of C. difficile.     Physical Exam   Triage Vital Signs: ED Triage Vitals  Enc Vitals Group     BP 06/13/22 0715 (!) 151/75     Pulse Rate 06/13/22 0715 79     Resp 06/13/22 0715 18     Temp 06/13/22 0715 97.9 F (36.6 C)     Temp src --      SpO2 06/13/22 0715 93 %     Weight 06/13/22 0740 138 lb 3.7 oz (62.7 kg)     Height 06/13/22 0740 '5\' 2"'$  (1.575 m)     Head Circumference --      Peak Flow --      Pain Score 06/13/22 0714 0     Pain Loc --      Pain Edu? --      Excl. in [place]? --     Most recent vital signs: Vitals:   06/13/22 1732 06/13/22 1737  BP: 130/67 131/64  Pulse: 67 74  Resp: 18 20  Temp: 98 F (36.7 C)   SpO2: 100% 100%     General: Awake, no distress.  CV:  Good peripheral perfusion.  Resp:  Normal effort.  Abd:  No distention.  Hyperactive  bowel sounds.  Minimal diffuse tenderness.  No guarding or rebound. Other:  Mildly dry mucous membranes.  No focal neurologic deficits.   ED Results / Procedures / Treatments   Labs (all labs ordered are listed, but only abnormal results are displayed) Labs Reviewed  BASIC METABOLIC PANEL - Abnormal; Notable for the following components:      Result Value   BUN 25 (*)    Creatinine, Ser 4.74 (*)    Calcium 8.5 (*)    GFR, Estimated 9 (*)    All other components within normal limits  CBC - Abnormal; Notable for the following components:   RBC 3.17 (*)    Hemoglobin 9.6 (*)    HCT 32.2 (*)    MCV 101.6 (*)    MCHC 29.8 (*)    RDW 20.2 (*)    All other components within normal limits  CBC WITH DIFFERENTIAL/PLATELET - Abnormal; Notable for the following components:   RBC 3.16 (*)    Hemoglobin 9.6 (*)    HCT 32.2 (*)    MCV 101.9 (*)  MCHC 29.8 (*)    RDW 20.3 (*)    All other components within normal limits  C DIFFICILE QUICK SCREEN W PCR REFLEX    GASTROINTESTINAL PANEL BY PCR, STOOL (REPLACES STOOL CULTURE)  SARS CORONAVIRUS 2 BY RT PCR  CULTURE, BLOOD (SINGLE)  LACTIC ACID, PLASMA  URINALYSIS, ROUTINE W REFLEX MICROSCOPIC  LACTIC ACID, PLASMA  HEPATIC FUNCTION PANEL  HEPATITIS B SURFACE ANTIGEN  HEPATITIS B SURFACE ANTIBODY,QUALITATIVE  HEPATITIS B SURFACE ANTIBODY, QUANTITATIVE  HEPATITIS B CORE ANTIBODY, TOTAL  HEPATITIS C ANTIBODY  CBG MONITORING, ED     EKG Normal sinus rhythm, ventricular rate 85.  PR 138, QRS 98, QTc 478.  No acute ST elevations or depressions.  No EKG evidence of acute ischemia or infarct.   RADIOLOGY CT A/P: No acute abnormalities noted   I also independently reviewed and agree with radiologist interpretations.   PROCEDURES:  Critical Care performed: No   MEDICATIONS ORDERED IN ED: Medications  Chlorhexidine Gluconate Cloth 2 % PADS 6 each (6 each Topical Not Given 06/13/22 1200)  pentafluoroprop-tetrafluoroeth (GEBAUERS)  aerosol 1 Application (has no administration in time range)  lidocaine (PF) (XYLOCAINE) 1 % injection 5 mL (has no administration in time range)  lidocaine-prilocaine (EMLA) cream 1 Application (has no administration in time range)  heparin injection 1,000 Units (has no administration in time range)  anticoagulant sodium citrate solution 5 mL (has no administration in time range)  alteplase (CATHFLO ACTIVASE) injection 2 mg (has no administration in time range)  acidophilus (RISAQUAD) capsule 2 capsule (2 capsules Oral Not Given 06/13/22 1600)  doxycycline (VIBRA-TABS) tablet 100 mg (100 mg Oral Not Given 06/13/22 1230)  aspirin EC tablet 81 mg (has no administration in time range)  colchicine tablet 0.6 mg (has no administration in time range)  amLODipine (NORVASC) tablet 5 mg (has no administration in time range)  atorvastatin (LIPITOR) tablet 40 mg (has no administration in time range)  pantoprazole (PROTONIX) EC tablet 40 mg (has no administration in time range)  sodium bicarbonate tablet 1,300 mg (has no administration in time range)  feeding supplement (ENSURE ENLIVE / ENSURE PLUS) liquid 237 mL (has no administration in time range)  albuterol (VENTOLIN HFA) 108 (90 Base) MCG/ACT inhaler 2 puff (has no administration in time range)  heparin injection 5,000 Units (has no administration in time range)  ondansetron (ZOFRAN) tablet 4 mg (has no administration in time range)    Or  ondansetron (ZOFRAN) injection 4 mg (has no administration in time range)  senna-docusate (Senokot-S) tablet 1 tablet (has no administration in time range)  loperamide (IMODIUM) capsule 2 mg (has no administration in time range)  dronabinol (MARINOL) capsule 2.5 mg (2.5 mg Oral Given 06/13/22 1319)  ipratropium-albuterol (DUONEB) 0.5-2.5 (3) MG/3ML nebulizer solution 3 mL (3 mLs Nebulization Not Given 06/13/22 1400)     IMPRESSION / MDM / ASSESSMENT AND PLAN / ED COURSE  I reviewed the triage vital signs and the  nursing notes.                               The patient is on the cardiac monitor to evaluate for evidence of arrhythmia and/or significant heart rate changes.   Ddx:  Differential includes the following, with pertinent life- or limb-threatening emergencies considered:  Antibiotic-associated diarrhea, C. Diff colitis, enteritis, colitis, diverticulitis, generalized deconditioning, recurrence of PNA related to recent admission  Patient's presentation is most consistent with acute presentation with potential  threat to life or bodily function.  MDM:  81 yo F with PMHx ESRD, HTN, HLD, here with diarrhea and generalized weakness. S/p recent admission for PNA on Zosyn. Pt afebrile here. Too weak to get out of bed or go to HD today. Overall, pt nontoxic. Labs are c/w her ESRD status with elevated Cr, but o/w reassuring. K 4.8. EKG nonischemic. CBC without leukocytosis. Anemia is at baseline. CT scan obtained, reviewed as above, and shows no surgical intra-abd emergency. Concern for possible ABX-associated diarrhea, possibly C. DIff. Pt has had gelatinous, melaonitc stools in ED here. Will need to monitor Hgb. Hemoccult is positive. Otherwise, pt also hypoxic here, with sats in the mid 80s on RA. She was just admitted for PNA per report and review of records.  WIll admit for recurrent hypoxia, generalized weakness and likely ABX-associated diarrhea with possible component of bleeding.Gi panel sent given degree and severity of diarrhea with need for further identification of pathogen.   MEDICATIONS GIVEN IN ED: Medications  Chlorhexidine Gluconate Cloth 2 % PADS 6 each (6 each Topical Not Given 06/13/22 1200)  pentafluoroprop-tetrafluoroeth (GEBAUERS) aerosol 1 Application (has no administration in time range)  lidocaine (PF) (XYLOCAINE) 1 % injection 5 mL (has no administration in time range)  lidocaine-prilocaine (EMLA) cream 1 Application (has no administration in time range)  heparin injection  1,000 Units (has no administration in time range)  anticoagulant sodium citrate solution 5 mL (has no administration in time range)  alteplase (CATHFLO ACTIVASE) injection 2 mg (has no administration in time range)  acidophilus (RISAQUAD) capsule 2 capsule (2 capsules Oral Not Given 06/13/22 1600)  doxycycline (VIBRA-TABS) tablet 100 mg (100 mg Oral Not Given 06/13/22 1230)  aspirin EC tablet 81 mg (has no administration in time range)  colchicine tablet 0.6 mg (has no administration in time range)  amLODipine (NORVASC) tablet 5 mg (has no administration in time range)  atorvastatin (LIPITOR) tablet 40 mg (has no administration in time range)  pantoprazole (PROTONIX) EC tablet 40 mg (has no administration in time range)  sodium bicarbonate tablet 1,300 mg (has no administration in time range)  feeding supplement (ENSURE ENLIVE / ENSURE PLUS) liquid 237 mL (has no administration in time range)  albuterol (VENTOLIN HFA) 108 (90 Base) MCG/ACT inhaler 2 puff (has no administration in time range)  heparin injection 5,000 Units (has no administration in time range)  ondansetron (ZOFRAN) tablet 4 mg (has no administration in time range)    Or  ondansetron (ZOFRAN) injection 4 mg (has no administration in time range)  senna-docusate (Senokot-S) tablet 1 tablet (has no administration in time range)  loperamide (IMODIUM) capsule 2 mg (has no administration in time range)  dronabinol (MARINOL) capsule 2.5 mg (2.5 mg Oral Given 06/13/22 1319)  ipratropium-albuterol (DUONEB) 0.5-2.5 (3) MG/3ML nebulizer solution 3 mL (3 mLs Nebulization Not Given 06/13/22 1400)     Consults:  Hospitalist   EMR reviewed  Reviewed hospitalist notes from recent hospitalization including d/c summary. Pt had been weaned off o2 at discharge.     FINAL CLINICAL IMPRESSION(S) / ED DIAGNOSES   Final diagnoses:  Acute respiratory failure with hypoxia (HCC)  Antibiotic-associated diarrhea  Generalized weakness     Rx  / DC Orders   ED Discharge Orders     None        Note:  This document was prepared using Dragon voice recognition software and may include unintentional dictation errors.   Duffy Bruce, MD 06/13/22 Velta Addison    Duffy Bruce,  MD 07/04/22 [id number]

## 2022-06-13 NOTE — Progress Notes (Addendum)
[facility]  ROUNDING NOTE   Subjective:   Krista Gordon is a 81 year old female with past medical history including anemia, diabetes, pulmonary fibrosis, pulmonary hypertension, and end-stage renal disease on hemodialysis.  Patient presents to the emergency room with complaints of weakness and diarrhea since starting a new antibiotic.  Patient has been admitted under observation for Impaired ambulation [R26.2]   Patient is known to our practice and receives outpatient dialysis treatments at [facility] on a TTS schedule, supervised by Dr. Candiss Norse.  Patient did receive full dialysis treatment on Saturday.  Patient was recently hospitalized for pneumonia and was treated with IV antibiotics.  Patient was weaned off oxygen and discharged home with Augmentin.  Patient states she has had 6 episodes of diarrhea this morning.  No abdominal pain.  No bloody or foul-smelling stool.  Patient denies known fever or chills.  Patient also awoke this morning with generalized weakness and inability to ambulate.  Labs on ED arrival unremarkable for renal patient.  Lactic acid normal.  Hemoglobin 9.6 with WBC 7.2.  COVID-19 swab negative.  C. difficile stool sample negative.  CT abdomen pelvis shows diverticulosis.  We have been consulted to maintain dialysis needs during this admission.  This visit was completed while patient was on dialysis   HEMODIALYSIS FLOWSHEET:  Blood Flow Rate (mL/min): 400 mL/min Arterial Pressure (mmHg): -80 mmHg Venous Pressure (mmHg): 230 mmHg TMP (mmHg): 8 mmHg Ultrafiltration Rate (mL/min): 599 mL/min Dialysate Flow Rate (mL/min): 300 ml/min Dialysis Fluid Bolus: Normal Saline Bolus Amount (mL): 300 mL    Objective:  Vital signs in last 24 hours:  Temp:  [97.3 F (36.3 C)-98 F (36.7 C)] 97.3 F (36.3 C) (08/29 1830) Pulse Rate:  [65-85] 67 (08/29 1830) Resp:  [16-25] 23 (08/29 1830) BP: (112-151)/(40-75) 137/61 (08/29 1830) SpO2:  [91  %-100 %] 98 % (08/29 1830) Weight:  [59.4 kg-62.7 kg] 59.4 kg (08/29 1737)  Weight change:  Filed Weights   06/13/22 0740 06/13/22 1348 06/13/22 1737  Weight: 62.7 kg 60.8 kg 59.4 kg    Intake/Output: No intake/output data recorded.   Intake/Output this shift:  Total I/O In: -  Out: 1500 [Other:1500]  Physical Exam: General: NAD, resting comfortably  Head: Normocephalic, atraumatic. Moist oral mucosal membranes  Eyes: Anicteric  Neck: Supple  Lungs:  Clear to auscultation, normal effort, room air  Heart: Regular rate and rhythm  Abdomen:  Soft, nontender, nondistended  Extremities: No peripheral edema.  Neurologic: Nonfocal, moving all four extremities  Skin: No lesions  Access: Left AVG    Basic Metabolic Panel: Recent Labs  Lab 06/07/22 0208 06/08/22 0647 06/09/22 0119 06/10/22 0052 06/11/22 0228 06/13/22 0718  NA 134* 138 135 138 138 139  K 3.7 3.3* 3.3* 3.4* 3.1* 4.8  CL 96* 97* 96* 99 100 105  CO2 '29 30 27 30 27 25  '$ GLUCOSE 91 81 90 87 69* 86  BUN '18 11 18 12 9 '$ 25*  CREATININE 3.69* 2.71* 3.55* 2.71* 2.81* 4.74*  CALCIUM 7.9* 7.9* 8.1* 8.1* 8.0* 8.5*  PHOS 3.7 3.0  --   --   --   --     Liver Function Tests: Recent Labs  Lab 06/07/22 0208 06/08/22 0647 06/08/22 0648 06/09/22 0119 06/10/22 0052  AST 665*  --  300* 177* 96*  ALT 90*  --  57* 41 29  ALKPHOS 88  --  81 74 74  BILITOT 0.9  --  1.0 1.0 0.9  PROT 5.7*  --  5.6* 5.7* 5.7*  ALBUMIN 2.4*  2.5* 2.3* 2.2* 2.4* 2.2*   No results for input(s): "LIPASE", "AMYLASE" in the last 168 hours. Recent Labs  Lab 06/07/22 0208  AMMONIA 33    CBC: Recent Labs  Lab 06/09/22 0119 06/10/22 0052 06/11/22 0228 06/13/22 0713 06/13/22 0718  WBC 7.0 7.5 7.4 7.3 7.2  NEUTROABS  --   --   --  5.0  --   HGB 8.4* 8.8* 8.4* 9.6* 9.6*  HCT 27.2* 28.7* 27.1* 32.2* 32.2*  MCV 98.6 100.7* 99.3 101.9* 101.6*  PLT 103* 105* 124* 189 221    Cardiac Enzymes: Recent Labs  Lab 06/07/22 1103  CKTOTAL  198    BNP: Invalid input(s): "POCBNP"  CBG: Recent Labs  Lab 06/10/22 1835 06/10/22 2124 06/11/22 0612 06/11/22 0733 06/11/22 1133  GLUCAP 88 80 71 74 71    Microbiology: Results for orders placed or performed during the hospital encounter of 06/13/22  C Difficile Quick Screen w PCR reflex     Status: None   Collection Time: 06/13/22 10:30 AM   Specimen: STOOL  Result Value Ref Range Status   C Diff antigen NEGATIVE NEGATIVE Final   C Diff toxin NEGATIVE NEGATIVE Final   C Diff interpretation No C. difficile detected.  Final    Comment: Performed at [facility], [address] [place], [place] [address]  Gastrointestinal Panel by PCR , Stool     Status: None   Collection Time: 06/13/22 10:30 AM   Specimen: STOOL  Result Value Ref Range Status   Campylobacter species NOT DETECTED NOT DETECTED Final   Plesimonas shigelloides NOT DETECTED NOT DETECTED Final   Salmonella species NOT DETECTED NOT DETECTED Final   Yersinia enterocolitica NOT DETECTED NOT DETECTED Final   Vibrio species NOT DETECTED NOT DETECTED Final   Vibrio cholerae NOT DETECTED NOT DETECTED Final   Enteroaggregative E coli (EAEC) NOT DETECTED NOT DETECTED Final   Enteropathogenic E coli (EPEC) NOT DETECTED NOT DETECTED Final   Enterotoxigenic E coli (ETEC) NOT DETECTED NOT DETECTED Final   Shiga like toxin producing E coli (STEC) NOT DETECTED NOT DETECTED Final   Shigella/Enteroinvasive E coli (EIEC) NOT DETECTED NOT DETECTED Final   Cryptosporidium NOT DETECTED NOT DETECTED Final   Cyclospora cayetanensis NOT DETECTED NOT DETECTED Final   Entamoeba histolytica NOT DETECTED NOT DETECTED Final   Giardia lamblia NOT DETECTED NOT DETECTED Final   Adenovirus F40/41 NOT DETECTED NOT DETECTED Final   Astrovirus NOT DETECTED NOT DETECTED Final   Norovirus GI/GII NOT DETECTED NOT DETECTED Final   Rotavirus A NOT DETECTED NOT DETECTED Final   Sapovirus (I, II, IV, and V) NOT DETECTED NOT DETECTED  Final    Comment: Performed at [facility], [address] [place], [place] [address]  SARS Coronavirus 2 by RT PCR (hospital order, performed in [place] hospital lab) *cepheid single result test* Anterior Nasal Swab     Status: None   Collection Time: 06/13/22 10:52 AM   Specimen: Anterior Nasal Swab  Result Value Ref Range Status   SARS Coronavirus 2 by RT PCR NEGATIVE NEGATIVE Final    Comment: (NOTE) SARS-CoV-2 target nucleic acids are NOT DETECTED.  The SARS-CoV-2 RNA is generally detectable in upper and lower respiratory specimens during the acute phase of infection. The lowest concentration of SARS-CoV-2 viral copies this assay can detect is 250 copies / mL. A negative result does not preclude SARS-CoV-2 infection and should not be used as the sole  basis for treatment or other patient management decisions.  A negative result may occur with improper specimen collection / handling, submission of specimen other than nasopharyngeal swab, presence of viral mutation(s) within the areas targeted by this assay, and inadequate number of viral copies (<250 copies / mL). A negative result must be combined with clinical observations, patient history, and epidemiological information.  Fact Sheet for Patients:   https://www.patel.info/  Fact Sheet for Healthcare Providers: https://hall.com/  This test is not yet approved or  cleared by the [place] FDA and has been authorized for detection and/or diagnosis of SARS-CoV-2 by FDA under an Emergency Use Authorization (EUA).  This EUA will remain in effect (meaning this test can be used) for the duration of the COVID-19 declaration under Section 564(b)(1) of the Act, 21 U.S.C. section 360bbb-3(b)(1), unless the authorization is terminated or revoked sooner.  Performed at [facility], [address] [place],  [address]     Coagulation Studies: No results  for input(s): "LABPROT", "INR" in the last 72 hours.  Urinalysis: No results for input(s): "COLORURINE", "LABSPEC", "PHURINE", "GLUCOSEU", "HGBUR", "BILIRUBINUR", "KETONESUR", "PROTEINUR", "UROBILINOGEN", "NITRITE", "LEUKOCYTESUR" in the last 72 hours.  Invalid input(s): "APPERANCEUR"    Imaging: DG Chest Portable 1 View  Result Date: 06/13/2022 CLINICAL DATA:  Shortness of breath EXAM: PORTABLE CHEST 1 VIEW COMPARISON:  Radiograph 06/07/2022 FINDINGS: Unchanged cardiomediastinal silhouette. Persistent reticular and airspace opacities bilaterally, with slightly increased opacities in the right mid mid lung. Trace bilateral pleural effusions. Improved left basilar opacities. No evidence of pneumothorax. Bones are unchanged. Thoracic spondylosis. 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 Slight increased opacities in the right mid lung, could reflect exacerbation of interstitial lung disease, atelectasis, or developing infection. Improved aeration in the left lung base in comparison to prior. Electronically Signed   By: Maurine Simmering M.D.   On: 06/13/2022 11:36   CT ABDOMEN PELVIS WO CONTRAST  Result Date: 06/13/2022 CLINICAL DATA:  Acute nonlocalized abdominal pain, diarrhea for 3 days, weakness EXAM: CT ABDOMEN AND PELVIS WITHOUT CONTRAST TECHNIQUE: Multidetector CT imaging of the abdomen and pelvis was performed following the standard protocol without IV contrast. RADIATION DOSE REDUCTION: This exam was performed according to the departmental dose-optimization program which includes automated exposure control, adjustment of the mA and/or kV according to patient size and/or use of iterative reconstruction technique. COMPARISON:  06/05/2022 FINDINGS: Lower chest: Bibasilar chronic interstitial lung disease changes and honeycomb formation. Small BILATERAL pleural effusions. No discrete pulmonary mass. Hepatobiliary: Gallbladder surgically absent. Liver normal appearance.  Pancreas: Normal appearance Spleen: Normal appearance Adrenals/Urinary Tract: Adrenal glands and LEFT kidney normal appearance. Markedly hypoplastic/atretic RIGHT kidney with a complicated cyst 3.8 x 3.7 cm image 21 containing mural calcification. Additional parenchymal calcification at upper pole RIGHT kidney. No hydronephrosis, hydroureter, or bladder abnormality. Stomach/Bowel: Appendix not visualized. Scattered colonic diverticulosis greatest at sigmoid colon. No definite pericolic inflammatory changes to suggest acute diverticulitis. Stomach decompressed. Small bowel loops unremarkable. Vascular/Lymphatic: Atherosclerotic calcifications aorta and iliac arteries without aneurysm. Few scattered pelvic phleboliths. No adenopathy. Reproductive: Post hysterectomy.  Nonvisualization of ovaries. Other: Small amount of free fluid in pelvis. No free air. No hernia. Small focus of soft tissue gas within anterior abdominal wall likely related to recent surgery, significantly decreased from previous exam. No focal fluid collection/abscess. Musculoskeletal: Osseous structures unremarkable. IMPRESSION: Colonic diverticulosis without evidence of diverticulitis. Small amount of nonspecific free fluid in pelvis. Bibasilar chronic interstitial lung disease changes and honeycomb formation with small BILATERAL pleural effusions.  Complex RIGHT renal cyst 3.8 x 3.7 cm containing a mural calcification, stable since 01/06/2019; based on stability, no follow-up imaging recommended. Aortic Atherosclerosis (ICD10-I70.0). Electronically Signed   By: Lavonia Dana M.D.   On: 06/13/2022 08:51     Medications:    anticoagulant sodium citrate      acidophilus  2 capsule Oral TID   amLODipine  5 mg Oral Daily   aspirin EC  81 mg Oral Daily   atorvastatin  40 mg Oral QHS   Chlorhexidine Gluconate Cloth  6 each Topical Q0600   [START ON 06/14/2022] colchicine  0.6 mg Oral QODAY   doxycycline  100 mg Oral Q12H   dronabinol  2.5 mg Oral  QAC lunch   [START ON 06/14/2022] feeding supplement  237 mL Oral BID BM   heparin  5,000 Units Subcutaneous Q12H   ipratropium-albuterol  3 mL Nebulization Q6H   pantoprazole  40 mg Oral Daily   sodium bicarbonate  1,300 mg Oral BID   albuterol, alteplase, anticoagulant sodium citrate, heparin, lidocaine (PF), lidocaine-prilocaine, loperamide, ondansetron **OR** ondansetron (ZOFRAN) IV, pentafluoroprop-tetrafluoroeth, senna-docusate  Assessment/ Plan:  Ms. Krista Gordon is a 81 y.o.  female past medical history including anemia, diabetes, pulmonary fibrosis, pulmonary hypertension, and end-stage renal disease on hemodialysis.  Patient presents to the emergency room with complaints of weakness and diarrhea since starting a new antibiotic.  Patient has been admitted under observation for Impaired ambulation [R26.2]  [facility]/TTS/left AVG  Anemia of chronic kidney disease Lab Results  Component Value Date   HGB 9.6 (L) 06/13/2022   Patient receives [place] outpatient.  2.  End-stage renal disease on hemodialysis.  Will maintain outpatient schedule.  Patient received dialysis today.  Next treatment scheduled for Thursday.  We will keep future dialysis sessions with low UF due to diarrhea.  3. Secondary Hyperparathyroidism:  Lab Results  Component Value Date   CALCIUM 8.5 (L) 06/13/2022   CAION 1.05 (L) 06/05/2022   PHOS 3.0 06/08/2022    Calcium within acceptable range.  4. 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 Most recent hemoglobin A1c is 4.0 on 06/02/2022.   Diet controlled  5.  Hypertension with chronic kidney disease.  Home regimen includes amlodipine.  Currently receiving this medication.   LOS: 0 Travares Nelles 8/29/20236:50 PM

## 2022-06-13 NOTE — ED Notes (Signed)
Pt A&Ox4/. Pt in room eating dinner. Pt just got to room after dialysis. Pt has spoken to 2 family members and is resting comfortably.

## 2022-06-13 NOTE — Progress Notes (Signed)
Post HD RN assessment 

## 2022-06-13 NOTE — ED Notes (Signed)
Attempted IV x 2 unsuccessful. Left extremity restriction due to HD fistula.

## 2022-06-13 NOTE — ED Triage Notes (Signed)
Pt comes via EMs from home with c/o diarrhea for 3 days. Pt states no belly pain. Pt states she is weak from going to the bathroom.

## 2022-06-13 NOTE — Plan of Care (Signed)

## 2022-06-13 NOTE — Progress Notes (Signed)
Pt 3.25 hour HD treatment complete w/ no complications. Report to ED RN. Pt alert, no c/o, vss. Start: 1413 End 1732 1571m fluid removed 68.2L BVP 59.4kg post standing weight No meds w/ HD

## 2022-06-14 ENCOUNTER — Encounter: Payer: Self-pay | Admitting: Internal Medicine

## 2022-06-14 DIAGNOSIS — R197 Diarrhea, unspecified: Secondary | ICD-10-CM

## 2022-06-14 DIAGNOSIS — J9611 Chronic respiratory failure with hypoxia: Secondary | ICD-10-CM | POA: Diagnosis not present

## 2022-06-14 DIAGNOSIS — E440 Moderate protein-calorie malnutrition: Secondary | ICD-10-CM | POA: Diagnosis not present

## 2022-06-14 DIAGNOSIS — R262 Difficulty in walking, not elsewhere classified: Secondary | ICD-10-CM | POA: Diagnosis not present

## 2022-06-14 DIAGNOSIS — R531 Weakness: Secondary | ICD-10-CM

## 2022-06-14 LAB — HEPATITIS C ANTIBODY: HCV Ab: NONREACTIVE

## 2022-06-14 LAB — HEPATITIS B SURFACE ANTIGEN: Hepatitis B Surface Ag: NONREACTIVE

## 2022-06-14 LAB — HEPATITIS B CORE ANTIBODY, TOTAL: Hep B Core Total Ab: NONREACTIVE

## 2022-06-14 LAB — HEPATITIS B SURFACE ANTIBODY,QUALITATIVE: Hep B S Ab: NONREACTIVE

## 2022-06-14 MED ORDER — RENA-VITE PO TABS: 1.0000 | ORAL_TABLET | Freq: Every day | ORAL | Status: DC

## 2022-06-14 MED ORDER — PROSOURCE PLUS PO LIQD
30.0000 mL | Freq: Two times a day (BID) | ORAL | Status: DC
  Filled 2022-06-14 (×2): qty 30

## 2022-06-14 MED ORDER — IPRATROPIUM-ALBUTEROL 0.5-2.5 (3) MG/3ML IN SOLN: 3.0000 mL | RESPIRATORY_TRACT | Status: DC | PRN

## 2022-06-14 NOTE — Progress Notes (Signed)
SATURATION QUALIFICATIONS: (This note is used to comply with regulatory documentation for home oxygen)  Patient Saturations on Room Air at Rest = 84%    Patient Saturations on 1 Liters of oxygen while Ambulating = 94%  Please briefly explain why patient needs home oxygen: Requires O2 to maintain O2 sats

## 2022-06-14 NOTE — Evaluation (Signed)
Physical Therapy Evaluation Patient Details Name: Krista Gordon MRN: [id number] DOB: May 22, 1941 Today's Date: 06/14/2022  History of Present Illness  Pt is 22 YOF admitted for worsening diarrhea and generalized weakness. MD assessment includes acute impaired ambulation and acute, probably on chronic hypoxic respiratory failure. PMH includes: ESRD, anemia, CKD, DM2, chronic pulmonary fibrosis, and pulmonary HTN.  Clinical Impression  Pt presents to PT in bed and agreeable to participate in therapy services. Pt was pleasant and motivated to participate during the session and put forth good effort throughout. Pt found on RA w SpO2 of 84%, nursing notified and pt put on 1L O2 via [place], SpO2 increased to >90%. Pt amb well w decr cadence, no LOB, and required no AD or physical assistance. SpO2 ranged from 91-94% following amb.  Would benefit from skilled PT to address above deficits in functional mobility and activity tolerance to promote optimal return to PLOF.      Recommendations for follow up therapy are one component of a multi-disciplinary discharge planning process, led by the attending physician.  Recommendations may be updated based on patient status, additional functional criteria and insurance authorization.  Follow Up Recommendations Home health PT      Assistance Recommended at Discharge Intermittent Supervision/Assistance  Patient can return home with the following  A little help with walking and/or transfers;A little help with bathing/dressing/bathroom;Assistance with cooking/housework;Direct supervision/assist for medications management;Direct supervision/assist for financial management;Assist for transportation;Help with stairs or ramp for entrance    Equipment Recommendations None recommended by PT  Recommendations for Other Services       Functional Status Assessment Patient has had a recent decline in their functional status and demonstrates the ability to make significant  improvements in function in a reasonable and predictable amount of time.     Precautions / Restrictions Precautions Precautions: Fall Restrictions Weight Bearing Restrictions: No      Mobility  Bed Mobility Overal bed mobility: Needs Assistance Bed Mobility: Supine to Sit     Supine to sit: Supervision          Transfers Overall transfer level: Needs assistance Equipment used: None Transfers: Sit to/from Stand Sit to Stand: Supervision                Ambulation/Gait Ambulation/Gait assistance: Min guard Gait Distance (Feet): 1x20 ft, 1x80 ft, 1x200 ft Assistive device: None Gait Pattern/deviations: Step-through pattern Gait velocity: decr     General Gait Details: Pt has slight lateral sway w gait, but has no LOB, requires no physical assistance or AD. amb w slow cadence  Stairs            Wheelchair Mobility    Modified Rankin (Stroke Patients Only)       Balance Overall balance assessment: Needs assistance Sitting-balance support: Single extremity supported, No upper extremity supported, Feet supported Sitting balance-Leahy Scale: Good     Standing balance support: No upper extremity supported, During functional activity Standing balance-Leahy Scale: Good                               Pertinent Vitals/Pain Pain Assessment Pain Assessment: No/denies pain    Home Living Family/patient expects to be discharged to:: Private residence Living Arrangements: Children Available Help at Discharge: Family;Available 24 hours/day Type of Home: House Home Access: Stairs to enter Entrance Stairs-Rails: None Entrance Stairs-Number of Steps: 4   Home Layout: One level Home Equipment: Conservation officer, nature (2 wheels) Additional Comments: pt  reports that she has access to RW but does not use it    Prior Function Prior Level of Function : Independent/Modified Independent             Mobility Comments: full community amb w no AD ADLs  Comments: (I) w ADLs/IADLs     Hand Dominance        Extremity/Trunk Assessment   Upper Extremity Assessment Upper Extremity Assessment: Overall WFL for tasks assessed    Lower Extremity Assessment Lower Extremity Assessment: Generalized weakness    Cervical / Trunk Assessment Cervical / Trunk Assessment: Kyphotic  Communication   Communication: HOH  Cognition Arousal/Alertness: Awake/alert Behavior During Therapy: WFL for tasks assessed/performed Overall Cognitive Status: Within Functional Limits for tasks assessed                                          General Comments      Exercises Total Joint Exercises Hip ABduction/ADduction: Strengthening, Both, 10 reps Long Arc Quad: Strengthening, Both, 10 reps Marching in Standing: Strengthening, Both, 10 reps, Seated   Assessment/Plan    PT Assessment Patient needs continued PT services  PT Problem List Decreased strength;Decreased activity tolerance;Decreased balance;Decreased mobility       PT Treatment Interventions Gait training;Functional mobility training;Therapeutic activities;Balance training;Patient/family education;Stair training;Therapeutic exercise    PT Goals (Current goals can be found in the Care Plan section)  Acute Rehab PT Goals Patient Stated Goal: to get better PT Goal Formulation: With patient Time For Goal Achievement: 06/27/22 Potential to Achieve Goals: Good    Frequency Min 2X/week     Co-evaluation               AM-PAC PT "6 Clicks" Mobility  Outcome Measure Help needed turning from your back to your side while in a flat bed without using bedrails?: None Help needed moving from lying on your back to sitting on the side of a flat bed without using bedrails?: None Help needed moving to and from a bed to a chair (including a wheelchair)?: A Little Help needed standing up from a chair using your arms (e.g., wheelchair or bedside chair)?: A Little Help needed to  walk in hospital room?: A Little Help needed climbing 3-5 steps with a railing? : A Lot 6 Click Score: 19    End of Session Equipment Utilized During Treatment: Oxygen;Gait belt Activity Tolerance: Patient tolerated treatment well Patient left: Other (comment) (Pt left on commode, instructed to call nursing when ready to return to bed, nursing notified.) Nurse Communication: Mobility status PT Visit Diagnosis: Other abnormalities of gait and mobility (R26.89);Muscle weakness (generalized) (M62.81);Difficulty in walking, not elsewhere classified (R26.2)    Time: 5462-7035 PT Time Calculation (min) (ACUTE ONLY): 30 min   Charges:              Glenice Laine MPH, SPT 06/14/22, 11:50 AM

## 2022-06-14 NOTE — Progress Notes (Signed)
Initial Nutrition Assessment  DOCUMENTATION CODES:   Non-severe (moderate) malnutrition in context of chronic illness  INTERVENTION:   -30 ml Prosource Plus BID, each supplement provides 100 kcals and 15 grams protein -Renal MVI daily -Magic cup TID with meals, each supplement provides 290 kcal and 9 grams of protein  -Liberalize diet to regular for wider variety of meal selections  NUTRITION DIAGNOSIS:   Moderate Malnutrition related to chronic illness as evidenced by mild fat depletion, mild muscle depletion.  GOAL:   Patient will meet greater than or equal to 90% of their needs  MONITOR:   PO intake, Supplement acceptance  REASON FOR ASSESSMENT:   Consult Assessment of nutrition requirement/status  ASSESSMENT:   Pt with medical history significant of ESRD on HD, anemia secondary to CKD, IIDM, chronic pulmonary fibrosis undiagnosed, severe pulmonary hypertension, sent for evaluation of worsening of diarrhea and generalized weakness.  Pt admitted with impaired ambulation and respiratory failure.   Reviewed I/O's: -1.5 L x 24 hours  Spoke with pt at bedside, who was eating breakfast at time of visit. She had already consumed about 50% of her meal. Per pt, her appetite "comes and goes". She typically consumes 1-2 meals per day which consist of ham and eggs or oodles of noodles. She does not have a strict schedule for when she eats and often "just eats whenever I get hungry, whenever that is". She does not notice a difference in diet recall on HD days vs non-HD days. Pt does not consume oral nutrition shakes as "they run right through me", but does enjoy the protein bars that are given to her at HD. Her HD treatments go well PTA and denies any abnormalities with her labs.   Pt reports that she has progressively lost weight over the past 6 years since her husband passed away. She is unsure of her UBW or EDW, but states "it all continues to go down". Pt has experienced a 4.7% wt  loss over the past 9 months, which is not significant for time frame.   Discussed importance of good meal and supplement intake to promote healing. Pt amenable to try Prosource.   Medications reviewed and include marinol.   Labs reviewed: CBGS: 71 (inpatient orders for glycemic control are none).    NUTRITION - FOCUSED PHYSICAL EXAM:  Flowsheet Row Most Recent Value  Orbital Region Mild depletion  Upper Arm Region Mild depletion  Thoracic and Lumbar Region No depletion  Buccal Region Mild depletion  Temple Region Moderate depletion  Clavicle Bone Region Mild depletion  Clavicle and Acromion Bone Region Mild depletion  Scapular Bone Region Mild depletion  Dorsal Hand Moderate depletion  Patellar Region Mild depletion  Anterior Thigh Region Mild depletion  Posterior Calf Region Mild depletion  Edema (RD Assessment) None  Hair Reviewed  Eyes Reviewed  Mouth Reviewed  Skin Reviewed  Nails Reviewed       Diet Order:   Diet Order             Diet regular Room service appropriate? Yes; Fluid consistency: Thin  Diet effective now                   EDUCATION NEEDS:   Education needs have been addressed  Skin:  Skin Assessment: Reviewed RN Assessment  Last BM:  06/14/22  Height:   Ht Readings from Last 1 Encounters:  06/13/22 '5\' 2"'$  (1.575 m)    Weight:   Wt Readings from Last 1 Encounters:  06/13/22 59.4  kg    Ideal Body Weight:  50 kg  BMI:  Body mass index is 23.95 kg/m.  Estimated Nutritional Needs:   Kcal:  6184-8592  Protein:  90-105 grams  Fluid:  1000 ml + UOP    Loistine Chance, RD, LDN, [place] Registered Dietitian II Certified Diabetes Care and Education Specialist Please refer to [facility] for RD and/or RD on-call/weekend/after hours pager

## 2022-06-14 NOTE — Progress Notes (Signed)
[facility]  ROUNDING NOTE   Subjective:   Krista Gordon is a 81 year old female with past medical history including anemia, diabetes, pulmonary fibrosis, pulmonary hypertension, and end-stage renal disease on hemodialysis.  Patient presents to the emergency room with complaints of weakness and diarrhea since starting a new antibiotic.  Patient has been admitted under observation for Antibiotic-associated diarrhea [K52.1, T36.95XA] Acute respiratory failure with hypoxia ([place]) [J96.01] Generalized weakness [R53.1] Impaired ambulation [R26.2]   Patient is known to our practice and receives outpatient dialysis treatments at [facility] on a TTS schedule, supervised by Dr. Candiss Norse.    Patient seen resting quietly in bed, preparing to work with PT/OT Continues to report diarrhea however decreased amounts Appetite remains intact, denies nausea or vomiting   Objective:  Vital signs in last 24 hours:  Temp:  [97.3 F (36.3 C)-98.1 F (36.7 C)] 97.9 F (36.6 C) (08/30 0744) Pulse Rate:  [65-81] 81 (08/30 0747) Resp:  [16-25] 16 (08/30 0747) BP: (112-148)/(40-70) 133/69 (08/30 0744) SpO2:  [94 %-100 %] 95 % (08/30 0747) Weight:  [59.4 kg] 59.4 kg (08/29 1737)  Weight change:  Filed Weights   06/13/22 0740 06/13/22 1348 06/13/22 1737  Weight: 62.7 kg 60.8 kg 59.4 kg    Intake/Output: I/O last 3 completed shifts: In: -  Out: 1500 [Other:1500]   Intake/Output this shift:  Total I/O In: 1 [P.O.:1] Out: -   Physical Exam: General: NAD, resting comfortably  Head: Normocephalic, atraumatic. Moist oral mucosal membranes  Eyes: Anicteric  Lungs:  Clear to auscultation, normal effort, room air  Heart: Regular rate and rhythm  Abdomen:  Soft, nontender, nondistended  Extremities: No peripheral edema.  Neurologic: Nonfocal, moving all four extremities  Skin: No lesions  Access: Left AVG    Basic Metabolic Panel: Recent Labs  Lab 06/08/22 0647  06/09/22 0119 06/10/22 0052 06/11/22 0228 06/13/22 0718  NA 138 135 138 138 139  K 3.3* 3.3* 3.4* 3.1* 4.8  CL 97* 96* 99 100 105  CO2 '30 27 30 27 25  '$ GLUCOSE 81 90 87 69* 86  BUN '11 18 12 9 '$ 25*  CREATININE 2.71* 3.55* 2.71* 2.81* 4.74*  CALCIUM 7.9* 8.1* 8.1* 8.0* 8.5*  PHOS 3.0  --   --   --   --      Liver Function Tests: Recent Labs  Lab 06/08/22 0647 06/08/22 0648 06/09/22 0119 06/10/22 0052 06/13/22 2121  AST  --  300* 177* 96* 33  ALT  --  57* 41 29 14  ALKPHOS  --  81 74 74 71  BILITOT  --  1.0 1.0 0.9 1.0  PROT  --  5.6* 5.7* 5.7* 6.2*  ALBUMIN 2.3* 2.2* 2.4* 2.2* 2.6*    No results for input(s): "LIPASE", "AMYLASE" in the last 168 hours. No results for input(s): "AMMONIA" in the last 168 hours.   CBC: Recent Labs  Lab 06/09/22 0119 06/10/22 0052 06/11/22 0228 06/13/22 0713 06/13/22 0718  WBC 7.0 7.5 7.4 7.3 7.2  NEUTROABS  --   --   --  5.0  --   HGB 8.4* 8.8* 8.4* 9.6* 9.6*  HCT 27.2* 28.7* 27.1* 32.2* 32.2*  MCV 98.6 100.7* 99.3 101.9* 101.6*  PLT 103* 105* 124* 189 221     Cardiac Enzymes: No results for input(s): "CKTOTAL", "CKMB", "CKMBINDEX", "TROPONINI" in the last 168 hours.   BNP: Invalid input(s): "POCBNP"  CBG: Recent Labs  Lab 06/10/22 1835 06/10/22 2124 06/11/22 0612 06/11/22 0733 06/11/22 1133  GLUCAP 88 80 71 74 71     Microbiology: Results for orders placed or performed during the hospital encounter of 06/13/22  Blood culture (single)     Status: None (Preliminary result)   Collection Time: 06/13/22 10:00 AM   Specimen: BLOOD  Result Value Ref Range Status   Specimen Description BLOOD RIGHT ARM  Final   Special Requests   Final    BOTTLES DRAWN AEROBIC AND ANAEROBIC Blood Culture adequate volume   Culture   Final    NO GROWTH < 24 HOURS Performed at [facility], [address] [place], [place] [address]    Report Status PENDING  Incomplete  C Difficile Quick Screen w PCR reflex     Status:  None   Collection Time: 06/13/22 10:30 AM   Specimen: STOOL  Result Value Ref Range Status   C Diff antigen NEGATIVE NEGATIVE Final   C Diff toxin NEGATIVE NEGATIVE Final   C Diff interpretation No C. difficile detected.  Final    Comment: Performed at [facility], [address] [place], [place] [address]  Gastrointestinal Panel by PCR , Stool     Status: None   Collection Time: 06/13/22 10:30 AM   Specimen: STOOL  Result Value Ref Range Status   Campylobacter species NOT DETECTED NOT DETECTED Final   Plesimonas shigelloides NOT DETECTED NOT DETECTED Final   Salmonella species NOT DETECTED NOT DETECTED Final   Yersinia enterocolitica NOT DETECTED NOT DETECTED Final   Vibrio species NOT DETECTED NOT DETECTED Final   Vibrio cholerae NOT DETECTED NOT DETECTED Final   Enteroaggregative E coli (EAEC) NOT DETECTED NOT DETECTED Final   Enteropathogenic E coli (EPEC) NOT DETECTED NOT DETECTED Final   Enterotoxigenic E coli (ETEC) NOT DETECTED NOT DETECTED Final   Shiga like toxin producing E coli (STEC) NOT DETECTED NOT DETECTED Final   Shigella/Enteroinvasive E coli (EIEC) NOT DETECTED NOT DETECTED Final   Cryptosporidium NOT DETECTED NOT DETECTED Final   Cyclospora cayetanensis NOT DETECTED NOT DETECTED Final   Entamoeba histolytica NOT DETECTED NOT DETECTED Final   Giardia lamblia NOT DETECTED NOT DETECTED Final   Adenovirus F40/41 NOT DETECTED NOT DETECTED Final   Astrovirus NOT DETECTED NOT DETECTED Final   Norovirus GI/GII NOT DETECTED NOT DETECTED Final   Rotavirus A NOT DETECTED NOT DETECTED Final   Sapovirus (I, II, IV, and V) NOT DETECTED NOT DETECTED Final    Comment: Performed at [facility], [address] [place], [place] [address]  SARS Coronavirus 2 by RT PCR (hospital order, performed in [place] hospital lab) *cepheid single result test* Anterior Nasal Swab     Status: None   Collection Time: 06/13/22 10:52 AM   Specimen: Anterior Nasal Swab   Result Value Ref Range Status   SARS Coronavirus 2 by RT PCR NEGATIVE NEGATIVE Final    Comment: (NOTE) SARS-CoV-2 target nucleic acids are NOT DETECTED.  The SARS-CoV-2 RNA is generally detectable in upper and lower respiratory specimens during the acute phase of infection. The lowest concentration of SARS-CoV-2 viral copies this assay can detect is 250 copies / mL. A negative result does not preclude SARS-CoV-2 infection and should not be used as the sole basis for treatment or other patient management decisions.  A negative result may occur with improper specimen collection / handling, submission of specimen other than nasopharyngeal swab, presence of viral mutation(s) within the areas targeted by this assay, and inadequate number of viral copies (<250 copies / mL).  A negative result must be combined with clinical observations, patient history, and epidemiological information.  Fact Sheet for Patients:   https://www.patel.info/  Fact Sheet for Healthcare Providers: https://hall.com/  This test is not yet approved or  cleared by the [place] FDA and has been authorized for detection and/or diagnosis of SARS-CoV-2 by FDA under an Emergency Use Authorization (EUA).  This EUA will remain in effect (meaning this test can be used) for the duration of the COVID-19 declaration under Section 564(b)(1) of the Act, 21 U.S.C. section 360bbb-3(b)(1), unless the authorization is terminated or revoked sooner.  Performed at [facility], [address] [place], [place] [address]     Coagulation Studies: No results for input(s): "LABPROT", "INR" in the last 72 hours.  Urinalysis: No results for input(s): "COLORURINE", "LABSPEC", "PHURINE", "GLUCOSEU", "HGBUR", "BILIRUBINUR", "KETONESUR", "PROTEINUR", "UROBILINOGEN", "NITRITE", "LEUKOCYTESUR" in the last 72 hours.  Invalid input(s): "APPERANCEUR"    Imaging: DG Chest Portable  1 View  Result Date: 06/13/2022 CLINICAL DATA:  Shortness of breath EXAM: PORTABLE CHEST 1 VIEW COMPARISON:  Radiograph 06/07/2022 FINDINGS: Unchanged cardiomediastinal silhouette. Persistent reticular and airspace opacities bilaterally, with slightly increased opacities in the right mid mid lung. Trace bilateral pleural effusions. Improved left basilar opacities. No evidence of pneumothorax. Bones are unchanged. Thoracic spondylosis. 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 Slight increased opacities in the right mid lung, could reflect exacerbation of interstitial lung disease, atelectasis, or developing infection. Improved aeration in the left lung base in comparison to prior. Electronically Signed   By: Maurine Simmering M.D.   On: 06/13/2022 11:36   CT ABDOMEN PELVIS WO CONTRAST  Result Date: 06/13/2022 CLINICAL DATA:  Acute nonlocalized abdominal pain, diarrhea for 3 days, weakness EXAM: CT ABDOMEN AND PELVIS WITHOUT CONTRAST TECHNIQUE: Multidetector CT imaging of the abdomen and pelvis was performed following the standard protocol without IV contrast. RADIATION DOSE REDUCTION: This exam was performed according to the departmental dose-optimization program which includes automated exposure control, adjustment of the mA and/or kV according to patient size and/or use of iterative reconstruction technique. COMPARISON:  06/05/2022 FINDINGS: Lower chest: Bibasilar chronic interstitial lung disease changes and honeycomb formation. Small BILATERAL pleural effusions. No discrete pulmonary mass. Hepatobiliary: Gallbladder surgically absent. Liver normal appearance. Pancreas: Normal appearance Spleen: Normal appearance Adrenals/Urinary Tract: Adrenal glands and LEFT kidney normal appearance. Markedly hypoplastic/atretic RIGHT kidney with a complicated cyst 3.8 x 3.7 cm image 21 containing mural calcification. Additional parenchymal calcification at upper pole RIGHT kidney. No  hydronephrosis, hydroureter, or bladder abnormality. Stomach/Bowel: Appendix not visualized. Scattered colonic diverticulosis greatest at sigmoid colon. No definite pericolic inflammatory changes to suggest acute diverticulitis. Stomach decompressed. Small bowel loops unremarkable. Vascular/Lymphatic: Atherosclerotic calcifications aorta and iliac arteries without aneurysm. Few scattered pelvic phleboliths. No adenopathy. Reproductive: Post hysterectomy.  Nonvisualization of ovaries. Other: Small amount of free fluid in pelvis. No free air. No hernia. Small focus of soft tissue gas within anterior abdominal wall likely related to recent surgery, significantly decreased from previous exam. No focal fluid collection/abscess. Musculoskeletal: Osseous structures unremarkable. IMPRESSION: Colonic diverticulosis without evidence of diverticulitis. Small amount of nonspecific free fluid in pelvis. Bibasilar chronic interstitial lung disease changes and honeycomb formation with small BILATERAL pleural effusions. Complex RIGHT renal cyst 3.8 x 3.7 cm containing a mural calcification, stable since 01/06/2019; based on stability, no follow-up imaging recommended. Aortic Atherosclerosis (ICD10-I70.0). Electronically Signed   By: Lavonia Dana M.D.   On: 06/13/2022 08:51     Medications:    anticoagulant  sodium citrate      (feeding supplement) PROSource Plus  30 mL Oral BID BM   acidophilus  2 capsule Oral TID   amLODipine  5 mg Oral Daily   aspirin EC  81 mg Oral Daily   atorvastatin  40 mg Oral QHS   Chlorhexidine Gluconate Cloth  6 each Topical Q0600   colchicine  0.6 mg Oral QODAY   doxycycline  100 mg Oral Q12H   dronabinol  2.5 mg Oral QAC lunch   feeding supplement  237 mL Oral BID BM   heparin  5,000 Units Subcutaneous Q12H   multivitamin  1 tablet Oral QHS   pantoprazole  40 mg Oral Daily   sodium bicarbonate  1,300 mg Oral BID   albuterol, alteplase, anticoagulant sodium citrate, heparin,  ipratropium-albuterol, lidocaine (PF), lidocaine-prilocaine, loperamide, ondansetron **OR** ondansetron (ZOFRAN) IV, pentafluoroprop-tetrafluoroeth, senna-docusate  Assessment/ Plan:  Ms. Krista Gordon is a 81 y.o.  female past medical history including anemia, diabetes, pulmonary fibrosis, pulmonary hypertension, and end-stage renal disease on hemodialysis.  Patient presents to the emergency room with complaints of weakness and diarrhea since starting a new antibiotic.  Patient has been admitted under observation for Antibiotic-associated diarrhea [K52.1, T36.95XA] Acute respiratory failure with hypoxia ([facility]) [J96.01] Generalized weakness [R53.1] Impaired ambulation [R26.2]  [facility] /TTS/left AVG  Anemia of chronic kidney disease Lab Results  Component Value Date   HGB 9.6 (L) 06/13/2022  Hemoglobin within desired range. Patient receives [place] outpatient.  2.  End-stage renal disease on hemodialysis.  Will maintain outpatient schedule.  Next treatment scheduled for Thursday.    3. Secondary Hyperparathyroidism:  Lab Results  Component Value Date   CALCIUM 8.5 (L) 06/13/2022   CAION 1.05 (L) 06/05/2022   PHOS 3.0 06/08/2022    Monitoring bone minerals during this admission.  4. 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 Most recent hemoglobin A1c is 4.0 on 06/02/2022.     5.  Hypertension with chronic kidney disease.  Home regimen includes amlodipine.  Currently receiving this medication.  Blood pressure stable for this patient.   LOS: 0 Arliss Hepburn 8/30/20232:17 PM

## 2022-06-14 NOTE — Care Management Obs Status (Signed)
[place] NOTIFICATION   Patient Details  Name: Krista Gordon MRN: [id number] Date of Birth: 14-Jan-1941   Medicare Observation Status Notification Given:  Yes    Beverly Sessions, RN 06/14/2022, 1:39 PM

## 2022-06-14 NOTE — TOC Initial Note (Signed)
Transition of Care [facility]) - Initial/Assessment Note    Patient Details  Name: Krista Gordon MRN: [id number] Date of Birth: 05/28/1941  Transition of Care [facility]) CM/SW Contact:    Beverly Sessions, RN Phone Number: 06/14/2022, 1:39 PM  Clinical Narrative:                      Admitted for: Deconditioning  Admitted from: home with daughter CXK:GYJEH Current home health/prior home health/DME: RW, tub bench, [facility]  Patient to discharge today [name] HD liaison notified of admission and discharge.  Patient open with [facility].  Resumption orders in.  [place] with [place] notified Daughter to transport at discharge O2 referral made to [place] with [facility].  Portable o2 to be delivered to room     Patient Goals and CMS Choice        Expected Discharge Plan and Services           Expected Discharge Date: 06/14/22                                    Prior Living Arrangements/Services                       Activities of Daily Living Home Assistive Devices/Equipment: None ADL Screening (condition at time of admission) Patient's cognitive ability adequate to safely complete daily activities?: Yes Is the patient deaf or have difficulty hearing?: No Does the patient have difficulty seeing, even when wearing glasses/contacts?: No Does the patient have difficulty concentrating, remembering, or making decisions?: No Patient able to express need for assistance with ADLs?: Yes Does the patient have difficulty dressing or bathing?: No Independently performs ADLs?: Yes (appropriate for developmental age) Communication: Independent Dressing (OT): Independent Grooming: Independent Feeding: Independent Bathing: Independent Toileting: Independent In/Out Bed: Independent Walks in Home: Independent Does the patient have difficulty walking or climbing stairs?: No Weakness of Legs: None Weakness of Arms/Hands: None  Permission Sought/Granted                   Emotional Assessment              Admission diagnosis:  Antibiotic-associated diarrhea [K52.1, T36.95XA] Acute respiratory failure with hypoxia ([place]) [J96.01] Generalized weakness [R53.1] Impaired ambulation [R26.2] Patient Active Problem List   Diagnosis Date Noted   Malnutrition of moderate degree 06/14/2022   Impaired ambulation 06/13/2022   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   Sepsis ([place]) 06/05/2022   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   Pneumonia due to COVID-19 virus 10/16/2021   CKD (chronic kidney disease), 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

## 2022-06-14 NOTE — Discharge Summary (Incomplete)
Physician Discharge Summary   Patient: Krista Gordon MRN: [id number] DOB: 07/15/1941  Admit date:     06/13/2022  Discharge date: 06/15/22  Discharge Physician: Jennye Boroughs   PCP: Marguerita Merles, MD   Recommendations at discharge:   Follow-up with PCP in 1 week  Discharge Diagnoses: Principal Problem:   Acute diarrhea Active Problems:   Impaired ambulation   Malnutrition of moderate degree   Generalized weakness   Chronic respiratory failure with hypoxia (HCC)  Resolved Problems:   * No resolved hospital problems. [facility] Course:  Ms. Krista Gordon is a 81 year old woman with medical history significant for ESRD on hemodialysis, anemia of chronic disease, hypoalbuminemia, moderate protein calorie malnutrition, paroxysmal atrial fibrillation, type II DM, pulmonary fibrosis, pulmonary hypertension, recent discharge from the hospital after hospitalization for pneumonia, acute hypoxic respiratory failure and acute mental status.  She had been discharged home on Augmentin.  However, she returned to the hospital because of multiple episodes of watery diarrhea and generalized weakness.  She was admitted to the hospital for acute diarrheal illness and generalized weakness.  Stool for C. difficile toxin and GI panel was negative.  Diarrhea was probably from recent use of antibiotics.  Her diarrhea improved and she insisted on going home because she felt better and stronger.  She has chronic hypoxic respiratory failure and she could not be weaned off of oxygen.  She was discharged on 1 L/min oxygen via nasal cannula.  Outpatient follow-up with pulmonologist was recommended for further evaluation and management of pulmonary fibrosis and pulmonary hypertension.        Consultants: Nephrologist Procedures performed: None Disposition: Home Diet recommendation:  Discharge Diet Orders (From admission, onward)     Start     Ordered   06/14/22 0000  Diet - low sodium heart healthy         06/14/22 1313           Renal diet DISCHARGE MEDICATION: Allergies as of 06/14/2022       Reactions   Mircera [methoxy Polyethylene Glycol-epoetin Beta]    Patient doesn't recall this allergy   Tramadol Itching        Medication List     STOP taking these medications    amoxicillin-clavulanate 500-125 MG tablet Commonly known as: Augmentin       TAKE these medications    amLODipine 5 MG tablet Commonly known as: NORVASC Take 5 mg by mouth daily.   aspirin EC 81 MG tablet Take 1 tablet (81 mg total) by mouth daily. Swallow whole.   atorvastatin 40 MG tablet Commonly known as: LIPITOR Take 40 mg by mouth at bedtime.   colchicine 0.6 MG tablet Take 0.6 mg by mouth every other day.   estradiol 0.1 MG/GM vaginal cream Commonly known as: ESTRACE VAGINAL Place 1 Applicatorful vaginally 3 (three) times a week. Use 1 small dolyp of cream on tip of index finger and swap the inside of the vagina   pantoprazole 40 MG tablet Commonly known as: Protonix Take 1 tablet (40 mg total) by mouth daily.   ProAir HFA 108 (90 Base) MCG/ACT inhaler Generic drug: albuterol Inhale 2 puffs into the lungs every 6 (six) hours as needed for shortness of breath.   sodium bicarbonate 650 MG tablet Take 1,300 mg by mouth 2 (two) times daily.        Follow-up Information     Krista Glazier, MD. Go on 06/28/2022.   Specialty: Pulmonary Disease Why: Pulmoary fibrosis, pulmonary  hypertension Contact information: [place] [id number] [phone]                Discharge Exam: Krista Gordon Weights   06/13/22 0740 06/13/22 1348 06/13/22 1737  Weight: 62.7 kg 60.8 kg 59.4 kg   GEN: NAD SKIN: Warm and dry EYES: EOMI ENT: MMM CV: RRR PULM: Bibasilar fine rales, no wheezing ABD: soft, ND, NT, +BS, multiple surgical scars on the abdomen (healed nicely) CNS: AAO x 3, non focal EXT: No edema or tenderness   Condition at discharge: good  The  results of significant diagnostics from this hospitalization (including imaging, microbiology, ancillary and laboratory) are listed below for reference.   Imaging Studies: DG Chest Portable 1 View  Result Date: 06/13/2022 CLINICAL DATA:  Shortness of breath EXAM: PORTABLE CHEST 1 VIEW COMPARISON:  Radiograph 06/07/2022 FINDINGS: Unchanged cardiomediastinal silhouette. Persistent reticular and airspace opacities bilaterally, with slightly increased opacities in the right mid mid lung. Trace bilateral pleural effusions. Improved left basilar opacities. No evidence of pneumothorax. Bones are unchanged. Thoracic spondylosis. 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 Slight increased opacities in the right mid lung, could reflect exacerbation of interstitial lung disease, atelectasis, or developing infection. Improved aeration in the left lung base in comparison to prior. Electronically Signed   By: Maurine Simmering M.D.   On: 06/13/2022 11:36   CT ABDOMEN PELVIS WO CONTRAST  Result Date: 06/13/2022 CLINICAL DATA:  Acute nonlocalized abdominal pain, diarrhea for 3 days, weakness EXAM: CT ABDOMEN AND PELVIS WITHOUT CONTRAST TECHNIQUE: Multidetector CT imaging of the abdomen and pelvis was performed following the standard protocol without IV contrast. RADIATION DOSE REDUCTION: This exam was performed according to the departmental dose-optimization program which includes automated exposure control, adjustment of the mA and/or kV according to patient size and/or use of iterative reconstruction technique. COMPARISON:  06/05/2022 FINDINGS: Lower chest: Bibasilar chronic interstitial lung disease changes and honeycomb formation. Small BILATERAL pleural effusions. No discrete pulmonary mass. Hepatobiliary: Gallbladder surgically absent. Liver normal appearance. Pancreas: Normal appearance Spleen: Normal appearance Adrenals/Urinary Tract: Adrenal glands and LEFT kidney normal appearance.  Markedly hypoplastic/atretic RIGHT kidney with a complicated cyst 3.8 x 3.7 cm image 21 containing mural calcification. Additional parenchymal calcification at upper pole RIGHT kidney. No hydronephrosis, hydroureter, or bladder abnormality. Stomach/Bowel: Appendix not visualized. Scattered colonic diverticulosis greatest at sigmoid colon. No definite pericolic inflammatory changes to suggest acute diverticulitis. Stomach decompressed. Small bowel loops unremarkable. Vascular/Lymphatic: Atherosclerotic calcifications aorta and iliac arteries without aneurysm. Few scattered pelvic phleboliths. No adenopathy. Reproductive: Post hysterectomy.  Nonvisualization of ovaries. Other: Small amount of free fluid in pelvis. No free air. No hernia. Small focus of soft tissue gas within anterior abdominal wall likely related to recent surgery, significantly decreased from previous exam. No focal fluid collection/abscess. Musculoskeletal: Osseous structures unremarkable. IMPRESSION: Colonic diverticulosis without evidence of diverticulitis. Small amount of nonspecific free fluid in pelvis. Bibasilar chronic interstitial lung disease changes and honeycomb formation with small BILATERAL pleural effusions. Complex RIGHT renal cyst 3.8 x 3.7 cm containing a mural calcification, stable since 01/06/2019; based on stability, no follow-up imaging recommended. Aortic Atherosclerosis (ICD10-I70.0). Electronically Signed   By: Lavonia Dana M.D.   On: 06/13/2022 08:51   CT HEAD WO CONTRAST (5MM)  Result Date: 06/08/2022 CLINICAL DATA:  81 year old female with altered mental status. EXAM: CT HEAD WITHOUT CONTRAST TECHNIQUE: Contiguous axial images were obtained from the base of the skull through the vertex without intravenous contrast. RADIATION DOSE REDUCTION: This exam was  performed according to the departmental dose-optimization program which includes automated exposure control, adjustment of the mA and/or kV according to patient size  and/or use of iterative reconstruction technique. COMPARISON:  Head CT 06/05/2022. FINDINGS: Brain: Cerebral volume is within normal limits for age. No midline shift, ventriculomegaly, mass effect, evidence of mass lesion, intracranial hemorrhage or evidence of cortically based acute infarction. Patchy bilateral white matter hypodensity appears stable. Elsewhere gray-white differentiation appears normal for age. No cortical encephalomalacia identified. Vascular: Mild Calcified atherosclerosis at the skull base. No suspicious intracranial vascular hyperdensity. Skull: No acute osseous abnormality identified. Sinuses/Orbits: Visualized paranasal sinuses and mastoids are clear. Other: Similar Disconjugate gaze. No acute orbit or scalp soft tissue finding. IMPRESSION: No acute intracranial abnormality. 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 Electronically Signed   By: Genevie Ann M.D.   On: 06/08/2022 04:24   DG Swallowing Func-Speech Pathology  Result Date: 06/07/2022 Table formatting from the original result was not included. Images from the original result were not included. Objective Swallowing Evaluation: Type of Study: MBS-Modified Barium Swallow Study  Patient Details Name: HADLEY SOILEAU MRN: [id number] Date of Birth: [birth year] Today's Date: 06/07/2022 Time: SLP Start Time (ACUTE ONLY): 1340 -SLP Stop Time (ACUTE ONLY): 1400 SLP Time Calculation (min) (ACUTE ONLY): 20 min Past Medical History: Past Medical History: Diagnosis Date  Anemia   COVID   patient states had COVID "over a month ago and in hospital 3 days"  Diabetes mellitus without complication ([place])   ESRD on hemodialysis ([place])   kidney disease  Gall stone   Gout   Hyperlipidemia   Hypertension  Past Surgical History: Past Surgical History: Procedure Laterality Date  A/V FISTULAGRAM Left 12/05/2021  Procedure: A/V Fistulagram;  Surgeon: Algernon Huxley, MD;  Location: [place] [facility];  Service: Cardiovascular;  Laterality: Left;  A/V SHUNTOGRAM Left 02/16/2020  Procedure: A/V SHUNTOGRAM;  Surgeon: Algernon Huxley, MD;  Location: [place] [facility];  Service: Cardiovascular;  Laterality: Left;  AV FISTULA PLACEMENT Left 01/15/2020  Procedure: INSERTION OF ARTERIOVENOUS (AV) GORE-TEX GRAFT ARM;  Surgeon: Algernon Huxley, MD;  Location: [facility];  Service: Vascular;  Laterality: Left;  COLONOSCOPY WITH PROPOFOL N/A 01/27/2016  Procedure: COLONOSCOPY WITH PROPOFOL;  Surgeon: Hulen Luster, MD;  Location: [facility];  Service: Gastroenterology;  Laterality: N/A;  ROBOTIC ASSISTED LAPAROSCOPIC SACROCOLPOPEXY Bilateral 06/02/2022  Procedure: XI ROBOTIC ASSISTED LAPAROSCOPIC SACROCOLPOPEXY SUPRACEVICAL HYSTERECTOMY AND BILATERAL SALPINGO OOPHERECTOMY;  Surgeon: Ardis Hughs, MD;  Location: [facility];  Service: Urology;  Laterality: Bilateral;  4.5 HRS FOR THIS CASE  TUBAL LIGATION   HPI: 81 year old female with PMH as below, significant for 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  Family report she wasn't at her baseline when she was discharged, was confused, but they were trying to get her home for her scheduled dialysis on Saturday.  However, she was only able to tolerate one hour due to hypotension.  Since, family report ongoing weakness, lethargy, poor PO intake, with patient primarily sleeping since.  At baseline, family reports patient is fully dependent in her ADLs,  drives, and still works as Neurosurgeon.  No data recorded  Recommendations for follow up therapy are one  component of a multi-disciplinary discharge planning process, led by the attending physician.  Recommendations may be updated based on patient status, additional functional criteria and insurance authorization. Assessment / Plan / Recommendation   06/07/2022   2:16 PM Clinical Impressions Clinical Impression Pt demonstrates mild oropharyngeal impiarment. Oral manipulation of puree and mastication and bolus formation of regular solids is prolonged and laborious. Pt has dentures but no denture paste which may be impacting effective chewing. She is also confused. 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 Pt has to swallow 2-3 time per bolus due to this strucural change. No aspiration occurred. Esophageal sweep showed barium tablet hesitate in the distal esophagus, but it passed with further liquid swallows. Pt is recommended to consume a puree diet with thin liquids. SLP will f/u to advance diet next week if mentation has improved. SLP Visit Diagnosis Dysphagia, unspecified (R13.10) Impact on safety and function Mild aspiration risk     06/07/2022   2:16 PM Treatment Recommendations Treatment Recommendations Therapy as outlined in treatment plan below     06/07/2022   2:16 PM Prognosis Prognosis for Safe Diet Advancement Good Barriers to Reach Goals Cognitive deficits   06/07/2022   2:16 PM Diet Recommendations SLP Diet Recommendations Dysphagia 1 (Puree) solids;Thin liquid Liquid Administration via Cup;Straw Medication Administration Whole meds with liquid Compensations Slow rate;Small sips/bites     06/07/2022   2:16 PM Other Recommendations Follow Up Recommendations Skilled nursing-short term rehab (<3 hours/day)   06/07/2022   2:16 PM Frequency and Duration  Speech Therapy Frequency (ACUTE ONLY) min 2x/week Treatment Duration 2 weeks     06/07/2022   2:16 PM Oral Phase Oral Phase Impaired Oral -  Thin Cup WFL Oral - Thin Straw WFL Oral - Puree Reduced posterior propulsion;Decreased bolus cohesion;Delayed oral transit Oral - Regular Reduced posterior propulsion;Impaired mastication;Decreased bolus cohesion;Delayed oral transit Oral - Pill [facility]    06/07/2022   2:16 PM Pharyngeal Phase Pharyngeal Phase Impaired Pharyngeal- Thin Cup Reduced epiglottic inversion;Delayed swallow initiation-pyriform sinuses;Delayed swallow initiation-vallecula Pharyngeal- Thin Straw Reduced epiglottic inversion;Delayed swallow initiation-pyriform sinuses;Delayed swallow initiation-vallecula Pharyngeal- Puree Reduced epiglottic inversion;Delayed swallow initiation-vallecula Pharyngeal- Regular Reduced epiglottic inversion;Delayed swallow initiation-vallecula Pharyngeal- Pill Reduced epiglottic inversion;Delayed swallow initiation-pyriform sinuses     No data to display    DeBlois, Katherene Ponto 06/07/2022, 2:57 PM                     DG CHEST PORT 1 VIEW  Result Date: 06/07/2022 CLINICAL DATA:  Status post right thoracentesis EXAM: PORTABLE CHEST 1 VIEW COMPARISON:  Chest x-ray dated June 07, 2022 FINDINGS: Cardiac and mediastinal contours are unchanged. Trace right pleural effusion, decreased in size when compared with prior exam. Stable trace left pleural effusion. No evidence of pneumothorax. Bibasilar atelectasis. Unchanged bilateral reticular opacities, likely due to underlying interstitial lung disease. IMPRESSION: No evidence of pneumothorax status post thoracentesis. Electronically Signed   By: Yetta Glassman M.D.   On: 06/07/2022 13:28   DG CHEST PORT 1 VIEW  Result Date: 06/07/2022 CLINICAL DATA:  Shortness of the breath common pneumonia. EXAM: PORTABLE CHEST 1 VIEW COMPARISON:  Chest radiograph June 06, 2022 FINDINGS: The heart size and mediastinal contours are partially obscured but appear unchanged. Interval progression of the bilateral right-greater-than-left interstitial and  airspace opacities. Stable  small right with new probable trace left pleural effusions. No acute osseous abnormality. IMPRESSION: Progression of right-greater-than-left interstitial and airspace opacities with a stable small right and probable new left pleural effusion, most consistent with worsening pneumonia. Electronically Signed   By: Dahlia Bailiff M.D.   On: 06/07/2022 08:35   ECHOCARDIOGRAM COMPLETE  Result Date: 06/06/2022    ECHOCARDIOGRAM REPORT   Patient Name:   VERLENE GLANTZ Date of Exam: 06/06/2022 Medical Rec #:  [id number]        Height:       62.0 in Accession #:    [id number]       Weight:       142.6 lb Date of Birth:  1941-08-11        BSA:          1.656 m Patient Age:    12 years         BP:           102/55 mmHg Patient Gender: F                HR:           77 bpm. Exam Location:  Inpatient Procedure: 2D Echo, Cardiac Doppler, Color Doppler and Intracardiac            Opacification Agent Indications:    Cardiomyopathy  History:        Patient has no prior history of Echocardiogram examinations.                 Risk Factors:Hypertension, Diabetes and Dyslipidemia.  Sonographer:    Jefferey Pica Referring Phys: [id number] [place]  1. Left ventricular ejection fraction, by estimation, is 50 to 55%. The left ventricle has low normal function. The left ventricle demonstrates global hypokinesis. Left ventricular diastolic parameters are consistent with Grade I diastolic dysfunction (impaired relaxation). There is the interventricular septum is flattened in diastole ('D' shaped left ventricle), consistent with right ventricular volume overload.  2. Right ventricular systolic function is mildly reduced. The right ventricular size is mildly enlarged. There is severely elevated pulmonary artery systolic pressure. The estimated right ventricular systolic pressure is 35.3 mmHg.  3. Right atrial size was severely dilated.  4. The mitral valve is normal in structure. No evidence of mitral valve regurgitation.   5. Tricuspid valve regurgitation is moderate.  6. The aortic valve is tricuspid. Aortic valve regurgitation is not visualized.  7. The inferior vena cava is dilated in size with <50% respiratory variability, suggesting right atrial pressure of 15 mmHg. Comparison(s): No prior Echocardiogram. Conclusion(s)/Recommendation(s): Significantly elevated PASP. FINDINGS  Left Ventricle: Left ventricular ejection fraction, by estimation, is 50 to 55%. The left ventricle has low normal function. The left ventricle demonstrates global hypokinesis. The left ventricular internal cavity size was normal in size. There is no left ventricular hypertrophy. The interventricular septum is flattened in diastole ('D' shaped left ventricle), consistent with right ventricular volume overload. Left ventricular diastolic parameters are consistent with Grade I diastolic dysfunction (impaired relaxation). Right Ventricle: The right ventricular size is mildly enlarged. Right ventricular systolic function is mildly reduced. There is severely elevated pulmonary artery systolic pressure. The tricuspid regurgitant velocity is 3.55 m/s, and with an assumed right atrial pressure of 15 mmHg, the estimated right ventricular systolic pressure is 29.9 mmHg. Left Atrium: Left atrial size was normal in size. Right Atrium: Right atrial size was severely dilated. Pericardium: There is no evidence of pericardial effusion.  Mitral Valve: The mitral valve is normal in structure. There is mild thickening of the mitral valve leaflet(s). No evidence of mitral valve regurgitation. Tricuspid Valve: Tricuspid valve regurgitation is moderate. Aortic Valve: The aortic valve is tricuspid. Aortic valve regurgitation is not visualized. Aortic valve peak gradient measures 7.7 mmHg. Pulmonic Valve: The pulmonic valve was normal in structure. Pulmonic valve regurgitation is mild. Aorta: The aortic root and ascending aorta are structurally normal, with no evidence of  dilitation. Venous: The inferior vena cava is dilated in size with less than 50% respiratory variability, suggesting right atrial pressure of 15 mmHg.  LEFT VENTRICLE PLAX 2D LVIDd:         4.60 cm   Diastology LVIDs:         3.25 cm   LV e' medial:    6.25 cm/s LV PW:         1.00 cm   LV E/e' medial:  8.3 LV IVS:        0.80 cm   LV e' lateral:   11.10 cm/s LVOT diam:     2.00 cm   LV E/e' lateral: 4.7 LV SV:         64 LV SV Index:   39 LVOT Area:     3.14 cm  IVC IVC diam: 2.60 cm LEFT ATRIUM           Index        RIGHT ATRIUM           Index LA diam:      3.50 cm 2.11 cm/m   RA Area:     26.90 cm LA Vol (A2C): 47.1 ml 28.44 ml/m  RA Volume:   113.00 ml 68.24 ml/m LA Vol (A4C): 38.9 ml 23.49 ml/m  AORTIC VALVE                 PULMONIC VALVE AV Area (Vmax): 2.58 cm     PV Vmax:       0.72 m/s AV Vmax:        139.00 cm/s  PV Peak grad:  2.1 mmHg AV Peak Grad:   7.7 mmHg LVOT Vmax:      114.00 cm/s LVOT Vmean:     62.000 cm/s LVOT VTI:       0.204 m  AORTA Ao Root diam: 3.10 cm Ao Asc diam:  2.90 cm MITRAL VALVE               TRICUSPID VALVE MV Area (PHT): 2.81 cm    TR Peak grad:   50.4 mmHg MV Decel Time: 270 msec    TR Vmax:        355.00 cm/s MV E velocity: 52.10 cm/s MV A velocity: 78.00 cm/s  SHUNTS MV E/A ratio:  0.67        Systemic VTI:  0.20 m                            Systemic Diam: 2.00 cm Phineas Inches Electronically signed by Phineas Inches Signature Date/Time: 06/06/2022/3:24:56 PM    Final    US Abdomen Limited RUQ (LIVER/GB)  Result Date: 06/06/2022 CLINICAL DATA:  Transaminitis. EXAM: ULTRASOUND ABDOMEN LIMITED RIGHT UPPER QUADRANT COMPARISON:  CT abdomen pelvis 06/05/2022 FINDINGS: Gallbladder: Surgically removed Common bile duct: Diameter: 10 mm, unchanged from the prior CT. Also no change from CT of 08/03/2021. Likely post cholecystectomy dilatation. Liver: No focal lesion identified. Within normal limits in  parenchymal echogenicity. Portal vein is patent on color Doppler imaging with  normal direction of blood flow towards the liver. Other: None IMPRESSION: Postop cholecystectomy. Chronic common bile duct dilatation 10 mm likely due to cholecystectomy. Electronically Signed   By: Franchot Gallo M.D.   On: 06/06/2022 13:49   EEG adult  Result Date: 06/06/2022 Lora Havens, MD     06/06/2022 12:06 PM Patient Name: KINDA POTTLE MRN: [id number] Epilepsy Attending: Lora Havens Referring Physician/Provider: Holley Bouche, MD Date: 06/06/2022 Duration: 22.53 mins Patient history: 81 year old female with altered mental status.  EEG to evaluate  for seizure. Level of alertness: Awake AEDs during EEG study: None Technical aspects: This EEG study was done with scalp electrodes positioned according to the 10-20 International system of electrode placement. Electrical activity was reviewed with band pass filter of 1-'70Hz'$ , sensitivity of 7 uV/mm, display speed of 61m/sec with a '60Hz'$  notched filter applied as appropriate. EEG data were recorded continuously and digitally stored.  Video monitoring was available and reviewed as appropriate. Description: No clear posterior dominant rhythm was seen. EEG showed continuous generalized 3 to 6 Hz theta-delta slowing. Hyperventilation and photic stimulation were not performed.   ABNORMALITY - Continuous slow, generalized IMPRESSION: This study is suggestive of moderate diffuse encephalopathy, nonspecific etiology. No seizures or epileptiform discharges were seen throughout the recording. PLora Havens  DG CHEST PORT 1 VIEW  Result Date: 06/06/2022 CLINICAL DATA:  Aspiration pneumonia EXAM: PORTABLE CHEST 1 VIEW COMPARISON:  Chest 06/05/2022 FINDINGS: Diffuse bilateral airspace disease has progressed. This is more severe on the right than the left and involves upper and lower lungs. Small right pleural effusion has progressed. No significant left effusion. Progression of bilateral airspace disease right greater than left compatible with  pneumonia. Progression of small right pleural effusion. IMPRESSION: No active disease. Electronically Signed   By: CFranchot GalloM.D.   On: 06/06/2022 09:47   CT ABDOMEN PELVIS W CONTRAST  Result Date: 06/05/2022 CLINICAL DATA:  Abdominal pain, acute, nonlocalized. Altered mental status. EXAM: CT ABDOMEN AND PELVIS WITH CONTRAST TECHNIQUE: Multidetector CT imaging of the abdomen and pelvis was performed using the standard protocol following bolus administration of intravenous contrast. RADIATION DOSE REDUCTION: This exam was performed according to the departmental dose-optimization program which includes automated exposure control, adjustment of the mA and/or kV according to patient size and/or use of iterative reconstruction technique. CONTRAST:  769mOMNIPAQUE IOHEXOL 300 MG/ML  SOLN COMPARISON:  Noncontrast abdominopelvic CT 08/03/2021. FINDINGS: Lower chest: Moderate right and small left pleural effusion are noted with bilateral lower lobe consolidation suspicious for pneumonia or aspiration. Underlying diffuse interstitial prominence noted. The heart is mildly enlarged. Hepatobiliary: The liver is normal in density without suspicious focal abnormality. Extrahepatic biliary dilatation is similar to the previous study and may be physiologic status post cholecystectomy. No calcified intraductal calculi identified. Pancreas: Unremarkable. No pancreatic ductal dilatation or surrounding inflammatory changes. Spleen: Normal in size without focal abnormality. Adrenals/Urinary Tract: Both adrenal glands appear normal. The left kidney appears unremarkable. 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 Appearance is unchanged from the prior examination. A Foley catheter is in place with decompression of the bladder. There is possible bladder wall thickening and surrounding inflammation. Stomach/Bowel: No enteric contrast administered. The  stomach appears unremarkable for its degree of distension. No evidence of bowel wall thickening, distention or surrounding inflammatory change. Diffuse diverticular changes throughout the descending and sigmoid colon are  again noted. Vascular/Lymphatic: There are no enlarged abdominal or pelvic lymph nodes. Aortic and branch vessel atherosclerosis without evidence of aneurysm or large vessel occlusion. The portal, superior mesenteric and splenic veins are patent. Reproductive: Hysterectomy.  No adnexal mass. Other: Possible small amount of free pelvic fluid. No focal extraluminal fluid or gas collections are seen within the peritoneal cavity or retroperitoneum. However, there is extensive subcutaneous emphysema throughout the anterior abdominal wall extending into the perineum. Musculoskeletal: No acute or significant osseous findings. Diffuse thoracolumbar spondylosis with bridging osteophytes and facet hypertrophy. IMPRESSION: 1. 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 In discussion with Dr. Jeanell Sparrow, patient underwent robotic assisted laparoscopic hysterectomy 3 days ago, and this emphysema is likely related to this procedure, not necessarily implying a complication or soft tissue infection. Correlate clinically. No pneumoperitoneum or focal fluid collection. 2. New bilateral pleural effusions with lower lobe consolidation suspicious for aspiration in the setting of recent surgery. 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 3. The bladder is decompressed by a Foley catheter. Possible bladder wall thickening which could be inflammatory. 4. 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 Chronic extrahepatic biliary dilatation post cholecystectomy, likely physiologic. Aortic Atherosclerosis (ICD10-I70.0). 5. These results were called by telephone at the time of  interpretation on 06/05/2022 at 2:08 pm to provider [facility] RAY , who verbally acknowledged these results. Electronically Signed   By: Richardean Sale M.D.   On: 06/05/2022 14:09   CT Head Wo Contrast  Result Date: 06/05/2022 CLINICAL DATA:  Mental status change. EXAM: CT HEAD WITHOUT CONTRAST TECHNIQUE: Contiguous axial images were obtained from the base of the skull through the vertex without intravenous contrast. RADIATION DOSE REDUCTION: This exam was performed according to the departmental dose-optimization program which includes automated exposure control, adjustment of the mA and/or kV according to patient size and/or use of iterative reconstruction technique. COMPARISON:  None Available. FINDINGS: Brain: No evidence of acute infarction, hemorrhage, hydrocephalus, extra-axial collection or mass lesion/mass effect. Vascular: No hyperdense vessel or unexpected calcification. Skull: Normal. Negative for fracture or focal lesion. Sinuses/Orbits: No acute finding. Other: None. IMPRESSION: No acute intracranial abnormality. Electronically Signed   By: Keane Police D.O.   On: 06/05/2022 13:38   DG Chest Port 1 View  Result Date: 06/05/2022 CLINICAL DATA:  Found unresponsive. Altered mental status. History of hypertension and diabetes. EXAM: PORTABLE CHEST 1 VIEW COMPARISON:  Radiographs 10/16/2021 FINDINGS: 2024 hours. Lower lung volumes. Allowing for this, the heart size and mediastinal contours are stable. Probable underlying diffuse fibrotic changes in the lungs with superimposed bibasilar atelectasis attributed to the lower lung volumes. No definite edema, confluent airspace opacity, significant pleural effusion or pneumothorax. Degenerative changes throughout the spine without evidence of acute osseous abnormality. Telemetry leads overlie the chest. IMPRESSION: Stable cardiomegaly and chronic interstitial prominence attributed to underlying fibrosis. Allowing for suboptimal inspiration on the current  study, no definite acute superimposed abnormality. Electronically Signed   By: Richardean Sale M.D.   On: 06/05/2022 12:14   DG Foot Complete Right  Result Date: 05/18/2022 CLINICAL DATA:  Initial encounter for trauma and pain. EXAM: RIGHT FOOT COMPLETE - 3+ VIEW COMPARISON:  None Available. FINDINGS: Osteopenia. Degenerative changes including about the midfoot, first metatarsophalangeal joint. Small Achilles and calcaneal spurs. Minimally displaced, oblique longitudinal fracture involving the proximal portion of the proximal phalanx of the first digit. Involvement of the first MTP joint. IMPRESSION: 1.  Proximal phalangeal fracture of the first digit with first MTP joint involvement. 2. Osteopenia with degenerative changes. Electronically Signed   By: Abigail Miyamoto M.D.   On: 05/18/2022 12:21    Microbiology: Results for orders placed or performed during the hospital encounter of 06/13/22  Blood culture (single)     Status: None (Preliminary result)   Collection Time: 06/13/22 10:00 AM   Specimen: BLOOD  Result Value Ref Range Status   Specimen Description BLOOD RIGHT ARM  Final   Special Requests   Final    BOTTLES DRAWN AEROBIC AND ANAEROBIC Blood Culture adequate volume   Culture   Final    NO GROWTH 2 DAYS Performed at [facility], [address], [place], [place] [address]    Report Status PENDING  Incomplete  C Difficile Quick Screen w PCR reflex     Status: None   Collection Time: 06/13/22 10:30 AM   Specimen: STOOL  Result Value Ref Range Status   C Diff antigen NEGATIVE NEGATIVE Final   C Diff toxin NEGATIVE NEGATIVE Final   C Diff interpretation No C. difficile detected.  Final    Comment: Performed at [facility], [address] [place], [place] [address]  Gastrointestinal Panel by PCR , Stool     Status: None   Collection Time: 06/13/22 10:30 AM   Specimen: STOOL  Result Value Ref Range Status   Campylobacter species NOT DETECTED NOT DETECTED Final    Plesimonas shigelloides NOT DETECTED NOT DETECTED Final   Salmonella species NOT DETECTED NOT DETECTED Final   Yersinia enterocolitica NOT DETECTED NOT DETECTED Final   Vibrio species NOT DETECTED NOT DETECTED Final   Vibrio cholerae NOT DETECTED NOT DETECTED Final   Enteroaggregative E coli (EAEC) NOT DETECTED NOT DETECTED Final   Enteropathogenic E coli (EPEC) NOT DETECTED NOT DETECTED Final   Enterotoxigenic E coli (ETEC) NOT DETECTED NOT DETECTED Final   Shiga like toxin producing E coli (STEC) NOT DETECTED NOT DETECTED Final   Shigella/Enteroinvasive E coli (EIEC) NOT DETECTED NOT DETECTED Final   Cryptosporidium NOT DETECTED NOT DETECTED Final   Cyclospora cayetanensis NOT DETECTED NOT DETECTED Final   Entamoeba histolytica NOT DETECTED NOT DETECTED Final   Giardia lamblia NOT DETECTED NOT DETECTED Final   Adenovirus F40/41 NOT DETECTED NOT DETECTED Final   Astrovirus NOT DETECTED NOT DETECTED Final   Norovirus GI/GII NOT DETECTED NOT DETECTED Final   Rotavirus A NOT DETECTED NOT DETECTED Final   Sapovirus (I, II, IV, and V) NOT DETECTED NOT DETECTED Final    Comment: Performed at [facility], [address] [place],  [address]  SARS Coronavirus 2 by RT PCR (hospital order, performed in [place] hospital lab) *cepheid single result test* Anterior Nasal Swab     Status: None   Collection Time: 06/13/22 10:52 AM   Specimen: Anterior Nasal Swab  Result Value Ref Range Status   SARS Coronavirus 2 by RT PCR NEGATIVE NEGATIVE Final    Comment: (NOTE) SARS-CoV-2 target nucleic acids are NOT DETECTED.  The SARS-CoV-2 RNA is generally detectable in upper and lower respiratory specimens during the acute phase of infection. The lowest concentration of SARS-CoV-2 viral copies this assay can detect is 250 copies / mL. A negative result does not preclude SARS-CoV-2 infection and should not be used as the sole basis for treatment or other patient management decisions.  A  negative result may occur with improper specimen collection / handling, submission of specimen other than  nasopharyngeal swab, presence of viral mutation(s) within the areas targeted by this assay, and inadequate number of viral copies (<250 copies / mL). A negative result must be combined with clinical observations, patient history, and epidemiological information.  Fact Sheet for Patients:   https://www.patel.info/  Fact Sheet for Healthcare Providers: https://hall.com/  This test is not yet approved or  cleared by the [place] FDA and has been authorized for detection and/or diagnosis of SARS-CoV-2 by FDA under an Emergency Use Authorization (EUA).  This EUA will remain in effect (meaning this test can be used) for the duration of the COVID-19 declaration under Section 564(b)(1) of the Act, 21 U.S.C. section 360bbb-3(b)(1), unless the authorization is terminated or revoked sooner.  Performed at [facility], [address] [place], [place] [address]     Labs: CBC: Recent Labs  Lab 06/09/22 0119 06/10/22 0052 06/11/22 0228 06/13/22 0713 06/13/22 0718  WBC 7.0 7.5 7.4 7.3 7.2  NEUTROABS  --   --   --  5.0  --   HGB 8.4* 8.8* 8.4* 9.6* 9.6*  HCT 27.2* 28.7* 27.1* 32.2* 32.2*  MCV 98.6 100.7* 99.3 101.9* 101.6*  PLT 103* 105* 124* 189 121   Basic Metabolic Panel: Recent Labs  Lab 06/09/22 0119 06/10/22 0052 06/11/22 0228 06/13/22 0718  NA 135 138 138 139  K 3.3* 3.4* 3.1* 4.8  CL 96* 99 100 105  CO2 '27 30 27 25  '$ GLUCOSE 90 87 69* 86  BUN '18 12 9 '$ 25*  CREATININE 3.55* 2.71* 2.81* 4.74*  CALCIUM 8.1* 8.1* 8.0* 8.5*   Liver Function Tests: Recent Labs  Lab 06/09/22 0119 06/10/22 0052 06/13/22 2121  AST 177* 96* 33  ALT 41 29 14  ALKPHOS 74 74 71  BILITOT 1.0 0.9 1.0  PROT 5.7* 5.7* 6.2*  ALBUMIN 2.4* 2.2* 2.6*   CBG: Recent Labs  Lab 06/10/22 1835 06/10/22 2124 06/11/22 0612  06/11/22 0733 06/11/22 1133  GLUCAP 88 80 71 74 71    Discharge time spent: greater than 30 minutes.  Signed: Jennye Boroughs, MD [facility] 06/15/2022

## 2022-06-15 DIAGNOSIS — R531 Weakness: Secondary | ICD-10-CM

## 2022-06-15 DIAGNOSIS — J9611 Chronic respiratory failure with hypoxia: Secondary | ICD-10-CM | POA: Diagnosis present

## 2022-06-15 DIAGNOSIS — R197 Diarrhea, unspecified: Secondary | ICD-10-CM | POA: Diagnosis present

## 2022-06-15 LAB — HEPATITIS B SURFACE ANTIBODY, QUANTITATIVE: Hep B S AB Quant (Post): <3.1 m[IU]/mL — ABNORMAL LOW (ref >9.9)

## 2022-06-18 LAB — CULTURE, BLOOD (SINGLE)
Culture: NO GROWTH
Special Requests: ADEQUATE

## 2022-08-14 ENCOUNTER — Encounter (INDEPENDENT_AMBULATORY_CARE_PROVIDER_SITE_OTHER): Payer: Self-pay

## 2022-10-27 ENCOUNTER — Other Ambulatory Visit: Payer: Self-pay

## 2022-10-27 ENCOUNTER — Emergency Department
Admission: EM | Admit: 2022-10-27 | Discharge: 2022-10-27 | Disposition: A | Discharge status: Home / Self Care | Payer: 59 | Attending: Emergency Medicine | Admitting: Emergency Medicine

## 2022-10-27 ENCOUNTER — Emergency Department: Payer: 59

## 2022-10-27 DIAGNOSIS — Y92511 Restaurant or cafe as the place of occurrence of the external cause: Secondary | ICD-10-CM | POA: Insufficient documentation

## 2022-10-27 DIAGNOSIS — W19XXXA Unspecified fall, initial encounter: Secondary | ICD-10-CM

## 2022-10-27 DIAGNOSIS — W010XXA Fall on same level from slipping, tripping and stumbling without subsequent striking against object, initial encounter: Secondary | ICD-10-CM | POA: Insufficient documentation

## 2022-10-27 DIAGNOSIS — I120 Hypertensive chronic kidney disease with stage 5 chronic kidney disease or end stage renal disease: Secondary | ICD-10-CM | POA: Insufficient documentation

## 2022-10-27 DIAGNOSIS — S52501A Unspecified fracture of the lower end of right radius, initial encounter for closed fracture: Secondary | ICD-10-CM | POA: Diagnosis not present

## 2022-10-27 DIAGNOSIS — M25511 Pain in right shoulder: Secondary | ICD-10-CM | POA: Diagnosis present

## 2022-10-27 DIAGNOSIS — S42201A Unspecified fracture of upper end of right humerus, initial encounter for closed fracture: Secondary | ICD-10-CM | POA: Insufficient documentation

## 2022-10-27 DIAGNOSIS — E1122 Type 2 diabetes mellitus with diabetic chronic kidney disease: Secondary | ICD-10-CM | POA: Insufficient documentation

## 2022-10-27 DIAGNOSIS — N186 End stage renal disease: Secondary | ICD-10-CM | POA: Insufficient documentation

## 2022-10-27 DIAGNOSIS — Z992 Dependence on renal dialysis: Secondary | ICD-10-CM | POA: Insufficient documentation

## 2022-10-27 LAB — CBC
HCT: 40.9 % (ref 36.0–46.0)
Hemoglobin: 12.2 g/dL (ref 12.0–15.0)
MCH: 30.6 pg (ref 26.0–34.0)
MCHC: 29.8 g/dL — ABNORMAL LOW (ref 30.0–36.0)
MCV: 102.5 fL — ABNORMAL HIGH (ref 80.0–100.0)
Platelets: 140 10*3/uL — ABNORMAL LOW (ref 150–400)
RBC: 3.99 MIL/uL (ref 3.87–5.11)
RDW: 15.4 % (ref 11.5–15.5)
WBC: 9.5 10*3/uL (ref 4.0–10.5)
nRBC: 0 % (ref 0.0–0.2)

## 2022-10-27 LAB — COMPREHENSIVE METABOLIC PANEL
ALT: 15 U/L (ref 0–44)
AST: 31 U/L (ref 15–41)
Albumin: 3.9 g/dL (ref 3.5–5.0)
Alkaline Phosphatase: 118 U/L (ref 38–126)
Anion gap: 13 (ref 5–15)
BUN: 27 mg/dL — ABNORMAL HIGH (ref 8–23)
CO2: 27 mmol/L (ref 22–32)
Calcium: 9.2 mg/dL (ref 8.9–10.3)
Chloride: 102 mmol/L (ref 98–111)
Creatinine, Ser: 4.85 mg/dL — ABNORMAL HIGH (ref 0.44–1.00)
GFR, Estimated: 9 mL/min — ABNORMAL LOW (ref >60)
Glucose, Bld: 112 mg/dL — ABNORMAL HIGH (ref 70–99)
Potassium: 3.8 mmol/L (ref 3.5–5.1)
Sodium: 142 mmol/L (ref 135–145)
Total Bilirubin: 0.6 mg/dL (ref 0.3–1.2)
Total Protein: 8.6 g/dL — ABNORMAL HIGH (ref 6.5–8.1)

## 2022-10-27 MED ORDER — OXYCODONE-ACETAMINOPHEN 5-325 MG PO TABS: 1.0000 | ORAL_TABLET | ORAL | 0 refills | Status: AC | PRN

## 2022-10-27 MED ORDER — HYDROMORPHONE HCL 1 MG/ML IJ SOLN
0.5000 mg | Freq: Once | INTRAMUSCULAR | Status: AC
  Administered 2022-10-27: 0.5 mg via INTRAMUSCULAR
  Filled 2022-10-27: qty 0.5

## 2022-10-27 MED ORDER — FENTANYL CITRATE PF 50 MCG/ML IJ SOSY
50.0000 ug | PREFILLED_SYRINGE | Freq: Once | INTRAMUSCULAR | Status: AC
  Administered 2022-10-27: 50 ug via INTRAMUSCULAR
  Filled 2022-10-27: qty 1

## 2022-10-27 MED ORDER — FENTANYL CITRATE PF 50 MCG/ML IJ SOSY: 50.0000 ug | PREFILLED_SYRINGE | Freq: Once | INTRAMUSCULAR | Status: DC

## 2022-10-27 NOTE — ED Provider Notes (Signed)
[facility] Provider Note    Event Date/Time   First MD Initiated Contact with Patient 10/27/22 1353     (approximate)  History   Chief Complaint: Fall  HPI  Krista Gordon is a 82 y.o. female with a past medical history of ESRD on HD, diabetes, hypertension, hyperlipidemia presents to the emergency department after a fall.  According to the patient she was at a restaurant when she tripped falling forwards.  Patient states she caught herself with her hands.  Denies hitting her head.  Denies LOC.  Denies headache.  Patient is on dialysis has a left upper extremity fistula.  Patient arrives with a right upper extremity/wrist deformation as well as pain in the right shoulder.  No headache no neck pain.  Good range of motion in all other extremities.  Does have pain with range of motion in the shoulder as well as any movement of the arm and the wrist.  Patient has obvious deformity to the wrist but appears closed no lacerations or abrasions noted.  Physical Exam   Triage Vital Signs: ED Triage Vitals [10/27/22 1358]  Enc Vitals Group     BP      Pulse      Resp      Temp      Temp src      SpO2      Weight 127 lb (57.6 kg)     Height '5\' 2"'$  (1.575 m)     Head Circumference      Peak Flow      Pain Score 10     Pain Loc      Pain Edu?      Excl. in [place]?     Most recent vital signs: There were no vitals filed for this visit.  General: Awake, no distress.  CV:  Good peripheral perfusion.  Regular rate and rhythm  Resp:  Normal effort.  Equal breath sounds bilaterally.  Abd:  No distention.  Soft, nontender.  No rebound or guarding. Other:  Patient has deformity to the right distal forearm/wrist, closed, neurovascularly intact with good cap refill in digits.  Sensation intact.  Patient does have tenderness to palpation of the right shoulder.   ED Results / Procedures / Treatments   RADIOLOGY  I have reviewed the patient's wrist x-rays.  Patient has  a distal radius fracture. Radiology is read comminuted intra-articular distal radial fracture with impaction.  Comminuted right humeral neck fracture.   MEDICATIONS ORDERED IN ED: Medications  fentaNYL (SUBLIMAZE) injection 50 mcg (has no administration in time range)     IMPRESSION / MDM / ASSESSMENT AND PLAN / ED COURSE  I reviewed the triage vital signs and the nursing notes.  Patient's presentation is most consistent with acute presentation with potential threat to life or bodily function.  Patient presents emergency department after mechanical fall at a restaurant.  Patient has an obvious fracture to the right wrist.  Will also obtain x-ray images of the shoulder to evaluate for fracture or dislocation.  We will treat the patient's pain we will also check basic labs as the patient is a dialysis patient.  Patient is x-rays confirm right distal radius fracture as well as a right proximal humerus fracture.  Patient's lab work is reassuring including normal CBC, reassuring chemistry with a normal potassium.  Patient is a dialysis patient.  I discussed the patient with Dr. Sharlet Salina.  We will place the patient in a sugar-tong splint with  a sling as well.  Patient will follow-up with orthopedics.  Will prescribe Percocet for pain control.  Patient agreeable to plan of care.  Daughter is here and is agreeable as well.  FINAL CLINICAL IMPRESSION(S) / ED DIAGNOSES   Right distal radius fracture Right humeral neck fracture    Note:  This document was prepared using Dragon voice recognition software and may include unintentional dictation errors.   Harvest Dark, MD 10/27/22 1622
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
## 2022-10-27 NOTE — ED Notes (Signed)
Two silver rings removed from right hand, wrapped with paper towel and placed per patient's instructions in the side, zippered compartment of her purse.

## 2022-10-27 NOTE — Discharge Instructions (Addendum)
As we discussed please use your prescribed Percocet as needed for pain control.  Please follow-up with orthopedics by calling the number provided.  Return to the emergency department for any significant pain or any other symptom personally concerning to yourself.

## 2023-11-10 NOTE — Procedures (Signed)
 [facility] Nephrology Hemodialysis Procedure Note   11/10/2023  Krista Gordon was seen and examined on hemodialysis  CHIEF COMPLAINT: End Stage Renal Disease  INTERVAL HISTORY: Tolerating HD Chair recline keep going back is her only compliant denies any SOB swelling or orthostatic symptoms  DIALYSIS TREATMENT DATA: Estimated Dry Weight (kg): 57.5 kg (126 lb 12.2 oz) Patient Goal Weight (kg): 0.5 kg (1 lb 1.6 oz)  Pre-Treatment Weight (kg): 57.2 kg (126 lb 1.7 oz)   Dialysis Bath Bath: 2 K+ / 2.5 Ca+ Dialysate Na (mEq/L): 137 mEq/L Dialysate HCO3 (mEq/L): 35 mEq/L Dialyzer: Cellentia-17H (98 mLs)  Blood Flow Rate (mL/min): 400 mL/min Dialysis Flow (mL/min): 800 mL/min  Machine Temperature (C): 36.5 C (97.7 F)    PHYSICAL EXAM: Vitals: Temp:  [35.2 C (95.4 F)-36.9 C (98.4 F)] 35.2 C (95.4 F) Heart Rate:  [58-75] 58 BP: (123-169)/(53-75) 130/60 MAP (mmHg):  [75-82] 82  General: fatigued, currently dialyzing in a Hemodialysis Recliner Pulmonary: normal respiratory effort Cardiovascular: regular rate and rhythm Extremities: no significant  edema Access: LUE AV fistula  LAB DATA: Lab Results  Component Value Date   NA 138 11/10/2023   K 4.1 11/10/2023   CL 97 (L) 11/10/2023   CO2 29.0 11/10/2023   BUN 22 11/10/2023   CREATININE 5.10 (H) 11/10/2023   CALCIUM  9.8 11/10/2023   MG 2.2 11/10/2023   PHOS 4.2 11/10/2023   ALBUMIN  3.6 02/25/2020    Lab Results  Component Value Date   HCT 26.1 (L) 11/10/2023   WBC 6.3 11/10/2023     ASSESSMENT/PLAN: End Stage Renal Disease on Intermittent Hemodialysis: UF goal: 0.5L as tolerated Adjust medications for a GFR <10 Avoid nephrotoxic agents Last HD Treatment:Started (11/10/23)   Bone Mineral Metabolism: Lab Results  Component Value Date   CALCIUM  9.8 11/10/2023   CALCIUM  9.5 11/08/2023   Lab Results  Component Value Date   ALBUMIN  3.6 02/25/2020   ALBUMIN  3.9 01/09/2020    Lab Results  Component Value Date   PHOS  4.2 11/10/2023   PHOS 4.3 11/08/2023   Lab Results  Component Value Date   PTH 79.5 11/02/2023   PTH 553.4 (H) 01/09/2020    Labs appropriate, no changes.  Anemia:  Lab Results  Component Value Date   HGB 8.6 (L) 11/10/2023   HGB 8.5 (L) 11/08/2023   HGB 8.6 (L) 11/07/2023   Iron Saturation (%)  Date Value Ref Range Status  11/02/2023 42 20 - 55 % Final    Lab Results  Component Value Date   FERRITIN 1,470.3 (H) 11/02/2023     Continue intravenous Epogen/Retacrit 6000 units with each treatment. (Home Mircera 50 mcg q4 weeks)  Vascular Access: Vascular Access functioning well - no need for intervention Blood Flow Rate (mL/min): 400 mL/min  IV Antibiotics to be administered at discharge: No  This procedure was fully reviewed with the patient and/or their decision-maker. The risks, benefits, and alternatives were discussed prior to the procedure. All questions were answered and written informed consent was obtained.  Steffan JONETTA Cramp, MD [facility] Division of Nephrology & Hypertension
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
## 2023-12-03 ENCOUNTER — Other Ambulatory Visit (INDEPENDENT_AMBULATORY_CARE_PROVIDER_SITE_OTHER): Payer: Self-pay | Admitting: Vascular Surgery

## 2023-12-03 ENCOUNTER — Ambulatory Visit (INDEPENDENT_AMBULATORY_CARE_PROVIDER_SITE_OTHER): Payer: 59

## 2023-12-03 DIAGNOSIS — N186 End stage renal disease: Secondary | ICD-10-CM | POA: Diagnosis not present

## 2023-12-03 DIAGNOSIS — Z992 Dependence on renal dialysis: Secondary | ICD-10-CM | POA: Diagnosis not present

## 2023-12-11 ENCOUNTER — Encounter (INDEPENDENT_AMBULATORY_CARE_PROVIDER_SITE_OTHER): Payer: Self-pay | Admitting: Nurse Practitioner

## 2023-12-11 ENCOUNTER — Ambulatory Visit (INDEPENDENT_AMBULATORY_CARE_PROVIDER_SITE_OTHER): Payer: 59 | Admitting: Nurse Practitioner

## 2023-12-11 VITALS — BP 123/73 | HR 93 | Resp 18 | Ht 62.0 in | Wt 124.8 lb

## 2023-12-11 DIAGNOSIS — E1122 Type 2 diabetes mellitus with diabetic chronic kidney disease: Secondary | ICD-10-CM | POA: Diagnosis not present

## 2023-12-11 DIAGNOSIS — I10 Essential (primary) hypertension: Secondary | ICD-10-CM

## 2023-12-11 DIAGNOSIS — N186 End stage renal disease: Secondary | ICD-10-CM

## 2023-12-11 DIAGNOSIS — Z992 Dependence on renal dialysis: Secondary | ICD-10-CM

## 2023-12-11 NOTE — H&P (View-Only) (Signed)
 Subjective:    Patient ID: Krista Gordon, female    DOB: 02/10/1941, 83 y.o.   MRN: [id number] Chief Complaint  Patient presents with   Follow-up    HDA/consult. left avg swelling. aneurysm. voora.    The patient returns to the office for follow up regarding a problem with their dialysis access.   The patient notes a significant increase in problems with dialysis.  It is reported that adequate dialysis is not being achieved.    The patient denies hand pain or other symptoms consistent with steal phenomena.  No significant arm swelling.  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  The patient denies redness or swelling at the access site. The patient denies fever or chills at home or while on dialysis.  No recent shortening of the patient's walking distance or new symptoms consistent with claudication.  No history of rest pain symptoms. No new ulcers or wounds of the lower extremities have occurred.  The patient denies amaurosis fugax or recent TIA symptoms. There are no recent neurological changes noted. There is no history of DVT, PE or superficial thrombophlebitis. No recent episodes of angina or shortness of breath documented.   Duplex ultrasound of the AV access shows a patent access.  The previously noted stenosis is significantly increased compared to last study.  Flow volume today is 411 cc/min (previous flow volume was 841 cc/min)    Review of Systems  Hematological:  Bruises/bleeds easily.  All other systems reviewed and are negative.      Objective:   Physical Exam Vitals reviewed.  HENT:     Head: Normocephalic.  Cardiovascular:     Rate and Rhythm: Normal rate.     Arteriovenous access: Left arteriovenous access is present.    Comments: Decreased thrill soft bruit Pulmonary:     Effort: Pulmonary effort is normal.  Skin:    General: Skin is warm and dry.  Neurological:     Mental Status: She is alert and oriented to person, place, and  time.  Psychiatric:        Mood and Affect: Mood normal.        Behavior: Behavior normal.        Thought Content: Thought content normal.     BP 123/73   Pulse 93   Resp 18   Ht 5\' 2"  (1.575 m)   Wt 124 lb 12.8 oz (56.6 kg)   BMI 22.83 kg/m   Past Medical History:  Diagnosis Date   Anemia    COVID    patient states had COVID "over a month ago and in hospital 3 days"   Diabetes mellitus without complication (HCC)    ESRD on hemodialysis (HCC)    kidney disease   Gall stone    Gout    Hyperlipidemia    Hypertension     Social History   Socioeconomic History   Marital status: Married    Spouse name: Not on file   Number of children: Not on file   Years of education: Not on file   Highest education level: Not on file  Occupational History   Not on file  Tobacco Use   Smoking status: Former   Smokeless tobacco: Never  Vaping Use   Vaping status: Never Used  Substance and Sexual Activity   Alcohol use: No   Drug use: No   Sexual activity: Not on file  Other Topics Concern   Not on file  Social  History Narrative   Lives with daughter, Julieanne Cotton.   Social Drivers of Corporate investment banker Strain: Not on file  Food Insecurity: Not on file  Transportation Needs: Not on file  Physical Activity: Not on file  Stress: Not on file  Social Connections: Not on file  Intimate Partner Violence: Not on file    Past Surgical History:  Procedure Laterality Date   A/V FISTULAGRAM Left 12/05/2021   Procedure: A/V Fistulagram;  Surgeon: Annice Needy, MD;  Location: [facility] INVASIVE CV LAB;  Service: Cardiovascular;  Laterality: Left;   A/V SHUNTOGRAM Left 02/16/2020   Procedure: A/V SHUNTOGRAM;  Surgeon: Annice Needy, MD;  Location: [facility] INVASIVE CV LAB;  Service: Cardiovascular;  Laterality: Left;   AV FISTULA PLACEMENT Left 01/15/2020   Procedure: INSERTION OF ARTERIOVENOUS (AV) GORE-TEX GRAFT ARM;  Surgeon: Annice Needy, MD;  Location: [facility] ORS;  Service: Vascular;   Laterality: Left;   COLONOSCOPY WITH PROPOFOL N/A 01/27/2016   Procedure: COLONOSCOPY WITH PROPOFOL;  Surgeon: Wallace Cullens, MD;  Location: [facility] ENDOSCOPY;  Service: Gastroenterology;  Laterality: N/A;   ROBOTIC ASSISTED LAPAROSCOPIC SACROCOLPOPEXY Bilateral 06/02/2022   Procedure: XI ROBOTIC ASSISTED LAPAROSCOPIC SACROCOLPOPEXY SUPRACEVICAL HYSTERECTOMY AND BILATERAL SALPINGO OOPHERECTOMY;  Surgeon: Crist Fat, MD;  Location: [facility] ORS;  Service: Urology;  Laterality: Bilateral;  4.5 HRS FOR THIS CASE   TUBAL LIGATION      Family History  Problem Relation Age of Onset   Breast cancer Sister 73    Allergies  Allergen Reactions   Mircera [Methoxy Polyethylene Glycol-Epoetin Beta]     Patient doesn't recall this allergy   Tramadol Itching       Latest Ref Rng & Units 10/27/2022    2:07 PM 06/13/2022    7:18 AM 06/13/2022    7:13 AM  CBC  WBC 4.0 - 10.5 K/uL 9.5  7.2  7.3   Hemoglobin 12.0 - 15.0 g/dL 16.1  9.6  9.6   Hematocrit 36.0 - 46.0 % 40.9  32.2  32.2   Platelets 150 - 400 K/uL 140  221  189       CMP     Component Value Date/Time   NA 142 10/27/2022 1407   K 3.8 10/27/2022 1407   CL 102 10/27/2022 1407   CO2 27 10/27/2022 1407   GLUCOSE 112 (H) 10/27/2022 1407   BUN 27 (H) 10/27/2022 1407   CREATININE 4.85 (H) 10/27/2022 1407   CALCIUM 9.2 10/27/2022 1407   PROT 8.6 (H) 10/27/2022 1407   ALBUMIN 3.9 10/27/2022 1407   AST 31 10/27/2022 1407   ALT 15 10/27/2022 1407   ALKPHOS 118 10/27/2022 1407   BILITOT 0.6 10/27/2022 1407   GFRNONAA 9 (L) 10/27/2022 1407     No results found.     Assessment & Plan:   1. ESRD on dialysis [facility]) (Primary) Recommend:  The patient is experiencing increasing problems with their dialysis access.  Patient should have a fistulagram with the intention for intervention.  The intention for intervention is to restore appropriate flow and prevent thrombosis and possible loss of the access.  As well as improve the quality of  dialysis therapy.  The risks, benefits and alternative therapies were reviewed in detail with the patient.  All questions were answered.  The patient agrees to proceed with angio/intervention.    The patient will follow up with me in the office after the procedure.   2. Essential hypertension Continue antihypertensive medications as already ordered, these  medications have been reviewed and there are no changes at this time.  3. Type 2 diabetes mellitus with chronic kidney disease, without long-term current use of insulin, unspecified CKD stage (HCC) Continue hypoglycemic medications as already ordered, these medications have been reviewed and there are no changes at this time.  Hgb A1C to be monitored as already arranged by primary service   Current Outpatient Medications on File Prior to Visit  Medication Sig Dispense Refill   alendronate (FOSAMAX) 70 MG tablet Take 70 mg by mouth once a week.     amLODipine (NORVASC) 5 MG tablet Take 5 mg by mouth daily.     aspirin EC 81 MG tablet Take 1 tablet (81 mg total) by mouth daily. Swallow whole. 30 tablet 12   atorvastatin (LIPITOR) 40 MG tablet Take 40 mg by mouth at bedtime.      colchicine 0.6 MG tablet Take 0.6 mg by mouth every other day.     estradiol (ESTRACE VAGINAL) 0.1 MG/GM vaginal cream Place 1 Applicatorful vaginally 3 (three) times a week. Use 1 small dolyp of cream on tip of index finger and swap the inside of the vagina 42.5 g 1   lisinopril (ZESTRIL) 40 MG tablet Take 40 mg by mouth daily.     loratadine (CLARITIN) 10 MG tablet Take 10 mg by mouth every other day.     oxyCODONE-acetaminophen (PERCOCET) 5-325 MG tablet Take 1 tablet by mouth every 4 (four) hours as needed for severe pain. 20 tablet 0   pantoprazole (PROTONIX) 40 MG tablet Take 1 tablet (40 mg total) by mouth daily. 30 tablet 1   PROAIR HFA 108 (90 Base) MCG/ACT inhaler Inhale 2 puffs into the lungs every 6 (six) hours as needed for shortness of breath.  2    sodium bicarbonate 650 MG tablet Take 1,300 mg by mouth 2 (two) times daily.     No current facility-administered medications on file prior to visit.    There are no Patient Instructions on file for this visit. No follow-ups on file.   Georgiana Spinner, NP

## 2023-12-11 NOTE — Progress Notes (Incomplete)
 Subjective:    Patient ID: Krista Gordon, female    DOB: 12/26/1940, 83 y.o.   MRN: [id number] Chief Complaint  Patient presents with  . Follow-up    HDA/consult. left avg swelling. aneurysm. voora.    HPI  Review of Systems     Objective:   Physical Exam  BP 123/73   Pulse 93   Resp 18   Ht 5\' 2"  (1.575 m)   Wt 124 lb 12.8 oz (56.6 kg)   BMI 22.83 kg/m   Past Medical History:  Diagnosis Date  . Anemia   . COVID    patient states had COVID "over a month ago and in hospital 3 days"  . Diabetes mellitus without complication (HCC)   . ESRD on hemodialysis (HCC)    kidney disease  . Gall stone   . Gout   . Hyperlipidemia   . Hypertension     Social History   Socioeconomic History  . Marital status: Married    Spouse name: Not on file  . Number of children: Not on file  . Years of education: Not on file  . Highest education level: Not on file  Occupational History  . Not on file  Tobacco Use  . Smoking status: Former  . Smokeless tobacco: Never  Vaping Use  . Vaping status: Never Used  Substance and Sexual Activity  . Alcohol use: No  . Drug use: No  . Sexual activity: Not on file  Other Topics Concern  . Not on file  Social History Narrative   Lives with daughter, Julieanne Cotton.   Social Drivers of Corporate investment banker Strain: Not on file  Food Insecurity: Not on file  Transportation Needs: Not on file  Physical Activity: Not on file  Stress: Not on file  Social Connections: Not on file  Intimate Partner Violence: Not on file    Past Surgical History:  Procedure Laterality Date  . A/V FISTULAGRAM Left 12/05/2021   Procedure: A/V Fistulagram;  Surgeon: Annice Needy, MD;  Location: [facility] INVASIVE CV LAB;  Service: Cardiovascular;  Laterality: Left;  . A/V SHUNTOGRAM Left 02/16/2020   Procedure: A/V SHUNTOGRAM;  Surgeon: Annice Needy, MD;  Location: [facility] INVASIVE CV LAB;  Service: Cardiovascular;  Laterality: Left;  . AV FISTULA PLACEMENT  Left 01/15/2020   Procedure: INSERTION OF ARTERIOVENOUS (AV) GORE-TEX GRAFT ARM;  Surgeon: Annice Needy, MD;  Location: [facility] ORS;  Service: Vascular;  Laterality: Left;  . COLONOSCOPY WITH PROPOFOL N/A 01/27/2016   Procedure: COLONOSCOPY WITH PROPOFOL;  Surgeon: Wallace Cullens, MD;  Location: [facility] ENDOSCOPY;  Service: Gastroenterology;  Laterality: N/A;  . ROBOTIC ASSISTED LAPAROSCOPIC SACROCOLPOPEXY Bilateral 06/02/2022   Procedure: XI ROBOTIC ASSISTED LAPAROSCOPIC SACROCOLPOPEXY SUPRACEVICAL HYSTERECTOMY AND BILATERAL SALPINGO OOPHERECTOMY;  Surgeon: Crist Fat, MD;  Location: [facility] ORS;  Service: Urology;  Laterality: Bilateral;  4.5 HRS FOR THIS CASE  . TUBAL LIGATION      Family History  Problem Relation Age of Onset  . Breast cancer Sister 79    Allergies  Allergen Reactions  . Mircera [Methoxy Polyethylene Glycol-Epoetin Beta]     Patient doesn't recall this allergy  . Tramadol Itching       Latest Ref Rng & Units 10/27/2022    2:07 PM 06/13/2022    7:18 AM 06/13/2022    7:13 AM  CBC  WBC 4.0 - 10.5 K/uL 9.5  7.2  7.3   Hemoglobin 12.0 - 15.0 g/dL 95.1  9.6  9.6   Hematocrit 36.0 - 46.0 % 40.9  32.2  32.2   Platelets 150 - 400 K/uL 140  221  189       CMP     Component Value Date/Time   NA 142 10/27/2022 1407   K 3.8 10/27/2022 1407   CL 102 10/27/2022 1407   CO2 27 10/27/2022 1407   GLUCOSE 112 (H) 10/27/2022 1407   BUN 27 (H) 10/27/2022 1407   CREATININE 4.85 (H) 10/27/2022 1407   CALCIUM 9.2 10/27/2022 1407   PROT 8.6 (H) 10/27/2022 1407   ALBUMIN 3.9 10/27/2022 1407   AST 31 10/27/2022 1407   ALT 15 10/27/2022 1407   ALKPHOS 118 10/27/2022 1407   BILITOT 0.6 10/27/2022 1407   GFRNONAA 9 (L) 10/27/2022 1407     No results found.     Assessment & Plan:   1. ESRD on dialysis [facility]) (Primary) Recommend:  The patient is experiencing increasing problems with their dialysis access.  Patient should have a fistulagram with the intention for intervention.   The intention for intervention is to restore appropriate flow and prevent thrombosis and possible loss of the access.  As well as improve the quality of dialysis therapy.  The risks, benefits and alternative therapies were reviewed in detail with the patient.  All questions were answered.  The patient agrees to proceed with angio/intervention.    The patient will follow up with me in the office after the procedure.   2. Essential hypertension Continue antihypertensive medications as already ordered, these medications have been reviewed and there are no changes at this time.  3. Type 2 diabetes mellitus with chronic kidney disease, without long-term current use of insulin, unspecified CKD stage (HCC) Continue hypoglycemic medications as already ordered, these medications have been reviewed and there are no changes at this time.  Hgb A1C to be monitored as already arranged by primary service   Current Outpatient Medications on File Prior to Visit  Medication Sig Dispense Refill  . alendronate (FOSAMAX) 70 MG tablet Take 70 mg by mouth once a week.    Marland Kitchen amLODipine (NORVASC) 5 MG tablet Take 5 mg by mouth daily.    Marland Kitchen aspirin EC 81 MG tablet Take 1 tablet (81 mg total) by mouth daily. Swallow whole. 30 tablet 12  . atorvastatin (LIPITOR) 40 MG tablet Take 40 mg by mouth at bedtime.     . colchicine 0.6 MG tablet Take 0.6 mg by mouth every other day.    . estradiol (ESTRACE VAGINAL) 0.1 MG/GM vaginal cream Place 1 Applicatorful vaginally 3 (three) times a week. Use 1 small dolyp of cream on tip of index finger and swap the inside of the vagina 42.5 g 1  . lisinopril (ZESTRIL) 40 MG tablet Take 40 mg by mouth daily.    Marland Kitchen loratadine (CLARITIN) 10 MG tablet Take 10 mg by mouth every other day.    . oxyCODONE-acetaminophen (PERCOCET) 5-325 MG tablet Take 1 tablet by mouth every 4 (four) hours as needed for severe pain. 20 tablet 0  . pantoprazole (PROTONIX) 40 MG tablet Take 1 tablet (40 mg total)  by mouth daily. 30 tablet 1  . PROAIR HFA 108 (90 Base) MCG/ACT inhaler Inhale 2 puffs into the lungs every 6 (six) hours as needed for shortness of breath.  2  . sodium bicarbonate 650 MG tablet Take 1,300 mg by mouth 2 (two) times daily.     No current facility-administered medications on file prior to visit.  There are no Patient Instructions on file for this visit. No follow-ups on file.   Georgiana Spinner, NP

## 2023-12-11 NOTE — Progress Notes (Signed)
 Subjective:    Patient ID: Krista Gordon, female    DOB: 02/10/1941, 83 y.o.   MRN: [id number] Chief Complaint  Patient presents with   Follow-up    HDA/consult. left avg swelling. aneurysm. voora.    The patient returns to the office for follow up regarding a problem with their dialysis access.   The patient notes a significant increase in problems with dialysis.  It is reported that adequate dialysis is not being achieved.    The patient denies hand pain or other symptoms consistent with steal phenomena.  No significant arm swelling.  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  The patient denies redness or swelling at the access site. The patient denies fever or chills at home or while on dialysis.  No recent shortening of the patient's walking distance or new symptoms consistent with claudication.  No history of rest pain symptoms. No new ulcers or wounds of the lower extremities have occurred.  The patient denies amaurosis fugax or recent TIA symptoms. There are no recent neurological changes noted. There is no history of DVT, PE or superficial thrombophlebitis. No recent episodes of angina or shortness of breath documented.   Duplex ultrasound of the AV access shows a patent access.  The previously noted stenosis is significantly increased compared to last study.  Flow volume today is 411 cc/min (previous flow volume was 841 cc/min)    Review of Systems  Hematological:  Bruises/bleeds easily.  All other systems reviewed and are negative.      Objective:   Physical Exam Vitals reviewed.  HENT:     Head: Normocephalic.  Cardiovascular:     Rate and Rhythm: Normal rate.     Arteriovenous access: Left arteriovenous access is present.    Comments: Decreased thrill soft bruit Pulmonary:     Effort: Pulmonary effort is normal.  Skin:    General: Skin is warm and dry.  Neurological:     Mental Status: She is alert and oriented to person, place, and  time.  Psychiatric:        Mood and Affect: Mood normal.        Behavior: Behavior normal.        Thought Content: Thought content normal.     BP 123/73   Pulse 93   Resp 18   Ht 5\' 2"  (1.575 m)   Wt 124 lb 12.8 oz (56.6 kg)   BMI 22.83 kg/m   Past Medical History:  Diagnosis Date   Anemia    COVID    patient states had COVID "over a month ago and in hospital 3 days"   Diabetes mellitus without complication (HCC)    ESRD on hemodialysis (HCC)    kidney disease   Gall stone    Gout    Hyperlipidemia    Hypertension     Social History   Socioeconomic History   Marital status: Married    Spouse name: Not on file   Number of children: Not on file   Years of education: Not on file   Highest education level: Not on file  Occupational History   Not on file  Tobacco Use   Smoking status: Former   Smokeless tobacco: Never  Vaping Use   Vaping status: Never Used  Substance and Sexual Activity   Alcohol use: No   Drug use: No   Sexual activity: Not on file  Other Topics Concern   Not on file  Social  History Narrative   Lives with daughter, Julieanne Cotton.   Social Drivers of Corporate investment banker Strain: Not on file  Food Insecurity: Not on file  Transportation Needs: Not on file  Physical Activity: Not on file  Stress: Not on file  Social Connections: Not on file  Intimate Partner Violence: Not on file    Past Surgical History:  Procedure Laterality Date   A/V FISTULAGRAM Left 12/05/2021   Procedure: A/V Fistulagram;  Surgeon: Annice Needy, MD;  Location: [facility] INVASIVE CV LAB;  Service: Cardiovascular;  Laterality: Left;   A/V SHUNTOGRAM Left 02/16/2020   Procedure: A/V SHUNTOGRAM;  Surgeon: Annice Needy, MD;  Location: [facility] INVASIVE CV LAB;  Service: Cardiovascular;  Laterality: Left;   AV FISTULA PLACEMENT Left 01/15/2020   Procedure: INSERTION OF ARTERIOVENOUS (AV) GORE-TEX GRAFT ARM;  Surgeon: Annice Needy, MD;  Location: [facility] ORS;  Service: Vascular;   Laterality: Left;   COLONOSCOPY WITH PROPOFOL N/A 01/27/2016   Procedure: COLONOSCOPY WITH PROPOFOL;  Surgeon: Wallace Cullens, MD;  Location: [facility] ENDOSCOPY;  Service: Gastroenterology;  Laterality: N/A;   ROBOTIC ASSISTED LAPAROSCOPIC SACROCOLPOPEXY Bilateral 06/02/2022   Procedure: XI ROBOTIC ASSISTED LAPAROSCOPIC SACROCOLPOPEXY SUPRACEVICAL HYSTERECTOMY AND BILATERAL SALPINGO OOPHERECTOMY;  Surgeon: Crist Fat, MD;  Location: [facility] ORS;  Service: Urology;  Laterality: Bilateral;  4.5 HRS FOR THIS CASE   TUBAL LIGATION      Family History  Problem Relation Age of Onset   Breast cancer Sister 73    Allergies  Allergen Reactions   Mircera [Methoxy Polyethylene Glycol-Epoetin Beta]     Patient doesn't recall this allergy   Tramadol Itching       Latest Ref Rng & Units 10/27/2022    2:07 PM 06/13/2022    7:18 AM 06/13/2022    7:13 AM  CBC  WBC 4.0 - 10.5 K/uL 9.5  7.2  7.3   Hemoglobin 12.0 - 15.0 g/dL 16.1  9.6  9.6   Hematocrit 36.0 - 46.0 % 40.9  32.2  32.2   Platelets 150 - 400 K/uL 140  221  189       CMP     Component Value Date/Time   NA 142 10/27/2022 1407   K 3.8 10/27/2022 1407   CL 102 10/27/2022 1407   CO2 27 10/27/2022 1407   GLUCOSE 112 (H) 10/27/2022 1407   BUN 27 (H) 10/27/2022 1407   CREATININE 4.85 (H) 10/27/2022 1407   CALCIUM 9.2 10/27/2022 1407   PROT 8.6 (H) 10/27/2022 1407   ALBUMIN 3.9 10/27/2022 1407   AST 31 10/27/2022 1407   ALT 15 10/27/2022 1407   ALKPHOS 118 10/27/2022 1407   BILITOT 0.6 10/27/2022 1407   GFRNONAA 9 (L) 10/27/2022 1407     No results found.     Assessment & Plan:   1. ESRD on dialysis [facility]) (Primary) Recommend:  The patient is experiencing increasing problems with their dialysis access.  Patient should have a fistulagram with the intention for intervention.  The intention for intervention is to restore appropriate flow and prevent thrombosis and possible loss of the access.  As well as improve the quality of  dialysis therapy.  The risks, benefits and alternative therapies were reviewed in detail with the patient.  All questions were answered.  The patient agrees to proceed with angio/intervention.    The patient will follow up with me in the office after the procedure.   2. Essential hypertension Continue antihypertensive medications as already ordered, these  medications have been reviewed and there are no changes at this time.  3. Type 2 diabetes mellitus with chronic kidney disease, without long-term current use of insulin, unspecified CKD stage (HCC) Continue hypoglycemic medications as already ordered, these medications have been reviewed and there are no changes at this time.  Hgb A1C to be monitored as already arranged by primary service   Current Outpatient Medications on File Prior to Visit  Medication Sig Dispense Refill   alendronate (FOSAMAX) 70 MG tablet Take 70 mg by mouth once a week.     amLODipine (NORVASC) 5 MG tablet Take 5 mg by mouth daily.     aspirin EC 81 MG tablet Take 1 tablet (81 mg total) by mouth daily. Swallow whole. 30 tablet 12   atorvastatin (LIPITOR) 40 MG tablet Take 40 mg by mouth at bedtime.      colchicine 0.6 MG tablet Take 0.6 mg by mouth every other day.     estradiol (ESTRACE VAGINAL) 0.1 MG/GM vaginal cream Place 1 Applicatorful vaginally 3 (three) times a week. Use 1 small dolyp of cream on tip of index finger and swap the inside of the vagina 42.5 g 1   lisinopril (ZESTRIL) 40 MG tablet Take 40 mg by mouth daily.     loratadine (CLARITIN) 10 MG tablet Take 10 mg by mouth every other day.     oxyCODONE-acetaminophen (PERCOCET) 5-325 MG tablet Take 1 tablet by mouth every 4 (four) hours as needed for severe pain. 20 tablet 0   pantoprazole (PROTONIX) 40 MG tablet Take 1 tablet (40 mg total) by mouth daily. 30 tablet 1   PROAIR HFA 108 (90 Base) MCG/ACT inhaler Inhale 2 puffs into the lungs every 6 (six) hours as needed for shortness of breath.  2    sodium bicarbonate 650 MG tablet Take 1,300 mg by mouth 2 (two) times daily.     No current facility-administered medications on file prior to visit.    There are no Patient Instructions on file for this visit. No follow-ups on file.   Georgiana Spinner, NP

## 2023-12-13 ENCOUNTER — Telehealth (INDEPENDENT_AMBULATORY_CARE_PROVIDER_SITE_OTHER): Payer: Self-pay

## 2023-12-13 NOTE — Telephone Encounter (Signed)
 Spoke with the patient and she is scheduled with Dr. Wyn Quaker on 12/24/23 with a  9:30 am arrival time to the [facility]. Pre-procedure instructions were discussed and will be sent to Mychart and mailed.

## 2023-12-13 NOTE — Telephone Encounter (Signed)
 Patient called requesting information about her surgery. Please call patient back.

## 2023-12-17 ENCOUNTER — Telehealth (INDEPENDENT_AMBULATORY_CARE_PROVIDER_SITE_OTHER): Payer: Self-pay

## 2023-12-17 NOTE — Telephone Encounter (Signed)
 Patient left a message on Sunday 12/16/23 frantic about me calling her regarding her procedure. I contacted the patient regarding her message from Sunday and per the patient she was "turned around about her surgery". I reassured the patient that her procedure was on 12/24/23 and that a letter had been mailed to her as well.

## 2023-12-24 ENCOUNTER — Encounter
Admission: RE | Disposition: A | Discharge status: Home / Self Care | Payer: Self-pay | Source: Home / Self Care | Attending: Vascular Surgery

## 2023-12-24 ENCOUNTER — Other Ambulatory Visit: Payer: Self-pay

## 2023-12-24 ENCOUNTER — Encounter: Payer: Self-pay | Admitting: Vascular Surgery

## 2023-12-24 ENCOUNTER — Ambulatory Visit
Admission: RE | Admit: 2023-12-24 | Discharge: 2023-12-24 | Disposition: A | Discharge status: Home / Self Care | Payer: 59 | Attending: Vascular Surgery | Admitting: Vascular Surgery

## 2023-12-24 DIAGNOSIS — T82898A Other specified complication of vascular prosthetic devices, implants and grafts, initial encounter: Secondary | ICD-10-CM | POA: Diagnosis present

## 2023-12-24 DIAGNOSIS — I120 Hypertensive chronic kidney disease with stage 5 chronic kidney disease or end stage renal disease: Secondary | ICD-10-CM | POA: Insufficient documentation

## 2023-12-24 DIAGNOSIS — Z79899 Other long term (current) drug therapy: Secondary | ICD-10-CM | POA: Diagnosis not present

## 2023-12-24 DIAGNOSIS — N186 End stage renal disease: Secondary | ICD-10-CM | POA: Diagnosis not present

## 2023-12-24 DIAGNOSIS — E1122 Type 2 diabetes mellitus with diabetic chronic kidney disease: Secondary | ICD-10-CM | POA: Insufficient documentation

## 2023-12-24 DIAGNOSIS — Z9889 Other specified postprocedural states: Secondary | ICD-10-CM | POA: Diagnosis not present

## 2023-12-24 DIAGNOSIS — Z992 Dependence on renal dialysis: Secondary | ICD-10-CM | POA: Insufficient documentation

## 2023-12-24 DIAGNOSIS — Y832 Surgical operation with anastomosis, bypass or graft as the cause of abnormal reaction of the patient, or of later complication, without mention of misadventure at the time of the procedure: Secondary | ICD-10-CM | POA: Diagnosis not present

## 2023-12-24 HISTORY — PX: A/V FISTULAGRAM: CATH118298

## 2023-12-24 LAB — POTASSIUM (ARMC VASCULAR LAB ONLY): Potassium (ARMC vascular lab): 3.5 mmol/L (ref 3.5–5.1)

## 2023-12-24 SURGERY — A/V FISTULAGRAM
Anesthesia: Moderate Sedation | Laterality: Left

## 2023-12-24 MED ORDER — CEFAZOLIN SODIUM-DEXTROSE 1-4 GM/50ML-% IV SOLN
INTRAVENOUS | Status: AC
  Filled 2023-12-24: qty 50

## 2023-12-24 MED ORDER — HEPARIN SODIUM (PORCINE) 1000 UNIT/ML IJ SOLN
INTRAMUSCULAR | Status: DC | PRN
  Administered 2023-12-24: 3000 [IU] via INTRAVENOUS

## 2023-12-24 MED ORDER — SODIUM CHLORIDE 0.9 % IV SOLN: INTRAVENOUS | Status: DC

## 2023-12-24 MED ORDER — ONDANSETRON HCL 4 MG/2ML IJ SOLN: 4.0000 mg | Freq: Four times a day (QID) | INTRAMUSCULAR | Status: DC | PRN

## 2023-12-24 MED ORDER — MIDAZOLAM HCL 2 MG/2ML IJ SOLN
INTRAMUSCULAR | Status: DC | PRN
  Administered 2023-12-24: 1 mg via INTRAVENOUS

## 2023-12-24 MED ORDER — FENTANYL CITRATE (PF) 100 MCG/2ML IJ SOLN
INTRAMUSCULAR | Status: DC | PRN
  Administered 2023-12-24: 50 ug via INTRAVENOUS

## 2023-12-24 MED ORDER — FAMOTIDINE 20 MG PO TABS: 40.0000 mg | ORAL_TABLET | Freq: Once | ORAL | Status: DC | PRN

## 2023-12-24 MED ORDER — IODIXANOL 320 MG/ML IV SOLN
INTRAVENOUS | Status: DC | PRN
  Administered 2023-12-24: 20 mL

## 2023-12-24 MED ORDER — MIDAZOLAM HCL 2 MG/ML PO SYRP: 8.0000 mg | ORAL_SOLUTION | Freq: Once | ORAL | Status: DC | PRN

## 2023-12-24 MED ORDER — DIPHENHYDRAMINE HCL 50 MG/ML IJ SOLN: 50.0000 mg | Freq: Once | INTRAMUSCULAR | Status: DC | PRN

## 2023-12-24 MED ORDER — HEPARIN (PORCINE) IN NACL 1000-0.9 UT/500ML-% IV SOLN
INTRAVENOUS | Status: DC | PRN
  Administered 2023-12-24: 1000 mL

## 2023-12-24 MED ORDER — LIDOCAINE-EPINEPHRINE (PF) 1 %-1:200000 IJ SOLN
INTRAMUSCULAR | Status: DC | PRN
  Administered 2023-12-24: 10 mL

## 2023-12-24 MED ORDER — HYDROMORPHONE HCL 1 MG/ML IJ SOLN: 1.0000 mg | Freq: Once | INTRAMUSCULAR | Status: DC | PRN

## 2023-12-24 MED ORDER — CEFAZOLIN SODIUM-DEXTROSE 1-4 GM/50ML-% IV SOLN
1.0000 g | INTRAVENOUS | Status: AC
  Administered 2023-12-24: 1 g via INTRAVENOUS

## 2023-12-24 MED ORDER — METHYLPREDNISOLONE SODIUM SUCC 125 MG IJ SOLR
125.0000 mg | Freq: Once | INTRAMUSCULAR | Status: DC | PRN
Start: 2023-12-24 — End: 2023-12-24

## 2023-12-24 MED ORDER — FENTANYL CITRATE PF 50 MCG/ML IJ SOSY
PREFILLED_SYRINGE | INTRAMUSCULAR | Status: AC
  Filled 2023-12-24: qty 1

## 2023-12-24 MED ORDER — HEPARIN SODIUM (PORCINE) 1000 UNIT/ML IJ SOLN
INTRAMUSCULAR | Status: AC
  Filled 2023-12-24: qty 10

## 2023-12-24 MED ORDER — MIDAZOLAM HCL 2 MG/2ML IJ SOLN
INTRAMUSCULAR | Status: AC
  Filled 2023-12-24: qty 2

## 2023-12-24 SURGICAL SUPPLY — 5 items
COVER PROBE ULTRASOUND 5X96 (MISCELLANEOUS) IMPLANT
DRAPE BRACHIAL (DRAPES) IMPLANT
PACK ANGIOGRAPHY (CUSTOM PROCEDURE TRAY) ×1 IMPLANT
SHEATH BRITE TIP 6FRX5.5 (SHEATH) IMPLANT
SUT MNCRL AB 4-0 PS2 18 (SUTURE) IMPLANT

## 2023-12-24 NOTE — Discharge Instructions (Addendum)
 Fistulagram, Care After Refer to this sheet in the next few weeks. These instructions provide you with information on caring for yourself after your procedure. Your health care provider may also give you more specific instructions. Your treatment has been planned according to current medical practices, but problems sometimes occur. Call your health care provider if you have any problems or questions after your procedure. What can I expect after the procedure? After your procedure, it is typical to have the following: A small amount of discomfort in the area where the catheters were placed. A small amount of bruising around the fistula. Sleepiness and fatigue.  Follow these instructions at home: Rest at home for the day following your procedure. Do not drive or operate heavy machinery while taking pain medicine. Take medicines only as directed by your health care provider. Do not take baths, swim, or use a hot tub until your health care provider approves. You may shower 24 hours after the procedure or as directed by your health care provider. Check your incision site daily for signs of infection including redness, swellings, discharge, foul odor of fever. Call doctor if you notice any of these signs.  Monitor your dialysis fistula carefully. Do not lift more than 10lbs for 3 days following procedure Contact a health care provider if: You have drainage, redness, swelling, or pain at your catheter site. You have a fever. You have chills. Get help right away if: You feel weak. You have trouble balancing. You have trouble moving your arms or legs. You have problems with your speech or vision. You can no longer feel a vibration or buzz when you put your fingers over your dialysis fistula. You have uncontrolled bleeding The limb that was used for the procedure: Swells. Is painful. Is cold. Is discolored, such as blue or pale white. This information is not intended to replace advice given to  you by your health care provider. Make sure you discuss any questions you have with your health care provider. Document Released: 02/16/2014 Document Revised: 03/09/2016 Document Reviewed: 11/21/2013 Elsevier Interactive Patient Education  2018 [employer].

## 2023-12-24 NOTE — Op Note (Signed)
 [facility]    OPERATIVE NOTE   PROCEDURE: 1.  Left brachial artery to brachial vein loop forearm arteriovenous graft cannulation under ultrasound guidance 2.  Left arm shuntogram and central venogram   PRE-OPERATIVE DIAGNOSIS: 1. ESRD 2. Malfunctioning left loop forearm arteriovenous graft  POST-OPERATIVE DIAGNOSIS: same as above   SURGEON: Festus Barren, MD  ANESTHESIA: local with MCS  ESTIMATED BLOOD LOSS: 5 cc  FINDING(S): The AV graft was patent without any focal stenoses.  The pseudoaneurysm near the arterial anastomosis have been covered with a covered stent.  This was successfully excluded.  The venous access site pseudoaneurysm was moderate in size without any skin threat clinically.  The stent across the venous anastomosis was widely patent and the stent into the brachial vein was widely patent.  The proximal upper arm brachial vein, axillary vein, subclavian vein, innominate vein, and superior vena cava were all patent without focal stenosis  SPECIMEN(S):  None  CONTRAST: 20 cc  FLUORO TIME: 0.4 minutes  MODERATE CONSCIOUS SEDATION TIME:  Approximately 19 minutes using 1 mg of Versed and 50 mcg of Fentanyl  INDICATIONS: Krista Gordon is a 83 y.o. female who presents with malfunctioning left brachial artery to axillary vein arteriovenous graft.  The patient is scheduled for left arm shuntogram.  The patient is aware the risks include but are not limited to: bleeding, infection, thrombosis of the cannulated access, and possible anaphylactic reaction to the contrast.  The patient is aware of the risks of the procedure and elects to proceed forward.  DESCRIPTION: After full informed written consent was obtained, the patient was brought back to the angiography suite and placed supine upon the angiography table.  The patient was connected to monitoring equipment. Moderate conscious sedation was administered during a face to face encounter throughout  the procedure with my supervision of the RN administering medicines and monitoring the patient's vital signs, pulse oximetry, telemetry and mental status throughout from the start of the procedure until the patient was taken to the recovery room The left arm was prepped and draped in the standard fashion for a percutaneous access intervention.  Under ultrasound guidance, the left loop forearm arteriovenous graft was cannulated with a micropuncture needle under direct ultrasound guidance were it was patent and a permanent image was performed.  The microwire was advanced into the graft and the needle was exchanged for the a microsheath.  I then upsized to a 6 Fr Sheath and imaging was performed.  Hand injections were completed to image the access including the central venous system. This demonstrated that the AV graft was patent without any focal stenoses.  The pseudoaneurysm near the arterial anastomosis have been covered with a covered stent.  This was successfully excluded.  The venous access site pseudoaneurysm was moderate in size without any skin threat clinically.  The stent across the venous anastomosis was widely patent and the stent into the brachial vein was widely patent.  The proximal upper arm brachial vein, axillary vein, subclavian vein, innominate vein, and superior vena cava were all patent without focal stenosis.  Based on the images, this patient will need no intervention.  A 4-0 Monocryl purse-string suture was sewn around the sheath.  The sheath was removed while tying down the suture.  A sterile bandage was applied to the puncture site.  COMPLICATIONS: None  CONDITION: Stable   Festus Barren  12/24/2023 10:48 AM    This note was created with Dragon Medical transcription system. Any errors  in dictation are purely unintentional.

## 2023-12-24 NOTE — Interval H&P Note (Signed)
 History and Physical Interval Note:  12/24/2023 9:57 AM  Krista Gordon  has presented today for surgery, with the diagnosis of L Arm Fistulagram    End Stage Renal.  The various methods of treatment have been discussed with the patient and family. After consideration of risks, benefits and other options for treatment, the patient has consented to  Procedure(s): A/V Fistulagram (Left) as a surgical intervention.  The patient's history has been reviewed, patient examined, no change in status, stable for surgery.  I have reviewed the patient's chart and labs.  Questions were answered to the patient's satisfaction.     Festus Barren

## 2024-03-04 ENCOUNTER — Encounter (INDEPENDENT_AMBULATORY_CARE_PROVIDER_SITE_OTHER): Payer: Self-pay

## 2024-03-20 ENCOUNTER — Other Ambulatory Visit (INDEPENDENT_AMBULATORY_CARE_PROVIDER_SITE_OTHER): Payer: Self-pay | Admitting: Vascular Surgery

## 2024-03-20 DIAGNOSIS — N186 End stage renal disease: Secondary | ICD-10-CM

## 2024-03-24 ENCOUNTER — Ambulatory Visit (INDEPENDENT_AMBULATORY_CARE_PROVIDER_SITE_OTHER): Admitting: Vascular Surgery

## 2024-03-24 ENCOUNTER — Encounter (INDEPENDENT_AMBULATORY_CARE_PROVIDER_SITE_OTHER)

## 2024-04-22 ENCOUNTER — Emergency Department

## 2024-04-22 ENCOUNTER — Encounter: Payer: Self-pay | Admitting: *Deleted

## 2024-04-22 ENCOUNTER — Other Ambulatory Visit: Payer: Self-pay

## 2024-04-22 ENCOUNTER — Inpatient Hospital Stay
Admission: EM | Admit: 2024-04-22 | Discharge: 2024-04-24 | DRG: 189 | Disposition: A | Discharge status: Home / Self Care | Attending: Internal Medicine | Admitting: Internal Medicine

## 2024-04-22 DIAGNOSIS — J9621 Acute and chronic respiratory failure with hypoxia: Secondary | ICD-10-CM | POA: Diagnosis not present

## 2024-04-22 DIAGNOSIS — R7989 Other specified abnormal findings of blood chemistry: Secondary | ICD-10-CM

## 2024-04-22 DIAGNOSIS — Z7982 Long term (current) use of aspirin: Secondary | ICD-10-CM

## 2024-04-22 DIAGNOSIS — Z7983 Long term (current) use of bisphosphonates: Secondary | ICD-10-CM

## 2024-04-22 DIAGNOSIS — E785 Hyperlipidemia, unspecified: Secondary | ICD-10-CM | POA: Diagnosis present

## 2024-04-22 DIAGNOSIS — J209 Acute bronchitis, unspecified: Secondary | ICD-10-CM | POA: Diagnosis present

## 2024-04-22 DIAGNOSIS — Z90710 Acquired absence of both cervix and uterus: Secondary | ICD-10-CM

## 2024-04-22 DIAGNOSIS — N186 End stage renal disease: Secondary | ICD-10-CM | POA: Diagnosis present

## 2024-04-22 DIAGNOSIS — Z87891 Personal history of nicotine dependence: Secondary | ICD-10-CM

## 2024-04-22 DIAGNOSIS — R0602 Shortness of breath: Secondary | ICD-10-CM | POA: Diagnosis not present

## 2024-04-22 DIAGNOSIS — D631 Anemia in chronic kidney disease: Secondary | ICD-10-CM | POA: Diagnosis present

## 2024-04-22 DIAGNOSIS — J849 Interstitial pulmonary disease, unspecified: Secondary | ICD-10-CM | POA: Diagnosis present

## 2024-04-22 DIAGNOSIS — I2489 Other forms of acute ischemic heart disease: Secondary | ICD-10-CM | POA: Diagnosis present

## 2024-04-22 DIAGNOSIS — I272 Pulmonary hypertension, unspecified: Secondary | ICD-10-CM | POA: Diagnosis present

## 2024-04-22 DIAGNOSIS — Z79899 Other long term (current) drug therapy: Secondary | ICD-10-CM

## 2024-04-22 DIAGNOSIS — Z9851 Tubal ligation status: Secondary | ICD-10-CM

## 2024-04-22 DIAGNOSIS — J441 Chronic obstructive pulmonary disease with (acute) exacerbation: Secondary | ICD-10-CM | POA: Diagnosis present

## 2024-04-22 DIAGNOSIS — I10 Essential (primary) hypertension: Secondary | ICD-10-CM | POA: Diagnosis present

## 2024-04-22 DIAGNOSIS — J440 Chronic obstructive pulmonary disease with acute lower respiratory infection: Secondary | ICD-10-CM | POA: Diagnosis present

## 2024-04-22 DIAGNOSIS — M109 Gout, unspecified: Secondary | ICD-10-CM | POA: Diagnosis present

## 2024-04-22 DIAGNOSIS — J9601 Acute respiratory failure with hypoxia: Secondary | ICD-10-CM

## 2024-04-22 DIAGNOSIS — D638 Anemia in other chronic diseases classified elsewhere: Secondary | ICD-10-CM | POA: Insufficient documentation

## 2024-04-22 DIAGNOSIS — Z885 Allergy status to narcotic agent status: Secondary | ICD-10-CM

## 2024-04-22 DIAGNOSIS — E1122 Type 2 diabetes mellitus with diabetic chronic kidney disease: Secondary | ICD-10-CM | POA: Diagnosis present

## 2024-04-22 DIAGNOSIS — Z992 Dependence on renal dialysis: Secondary | ICD-10-CM

## 2024-04-22 DIAGNOSIS — E119 Type 2 diabetes mellitus without complications: Secondary | ICD-10-CM

## 2024-04-22 DIAGNOSIS — Z1152 Encounter for screening for COVID-19: Secondary | ICD-10-CM

## 2024-04-22 DIAGNOSIS — Z8616 Personal history of COVID-19: Secondary | ICD-10-CM

## 2024-04-22 DIAGNOSIS — I120 Hypertensive chronic kidney disease with stage 5 chronic kidney disease or end stage renal disease: Secondary | ICD-10-CM | POA: Diagnosis present

## 2024-04-22 DIAGNOSIS — J40 Bronchitis, not specified as acute or chronic: Secondary | ICD-10-CM

## 2024-04-22 LAB — BASIC METABOLIC PANEL WITH GFR
Anion gap: 9 (ref 5–15)
BUN: 18 mg/dL (ref 8–23)
CO2: 28 mmol/L (ref 22–32)
Calcium: 9.1 mg/dL (ref 8.9–10.3)
Chloride: 100 mmol/L (ref 98–111)
Creatinine, Ser: 3.97 mg/dL — ABNORMAL HIGH (ref 0.44–1.00)
GFR, Estimated: 11 mL/min — ABNORMAL LOW (ref >60)
Glucose, Bld: 152 mg/dL — ABNORMAL HIGH (ref 70–99)
Potassium: 4.5 mmol/L (ref 3.5–5.1)
Sodium: 137 mmol/L (ref 135–145)

## 2024-04-22 LAB — CBC
HCT: 34 % — ABNORMAL LOW (ref 36.0–46.0)
Hemoglobin: 10.3 g/dL — ABNORMAL LOW (ref 12.0–15.0)
MCH: 30.1 pg (ref 26.0–34.0)
MCHC: 30.3 g/dL (ref 30.0–36.0)
MCV: 99.4 fL (ref 80.0–100.0)
Platelets: 133 K/uL — ABNORMAL LOW (ref 150–400)
RBC: 3.42 MIL/uL — ABNORMAL LOW (ref 3.87–5.11)
RDW: 14.4 % (ref 11.5–15.5)
WBC: 8 K/uL (ref 4.0–10.5)
nRBC: 0 % (ref 0.0–0.2)

## 2024-04-22 LAB — TROPONIN I (HIGH SENSITIVITY): Troponin I (High Sensitivity): 53 ng/L — ABNORMAL HIGH (ref <18)

## 2024-04-22 MED ORDER — IPRATROPIUM-ALBUTEROL 0.5-2.5 (3) MG/3ML IN SOLN
3.0000 mL | Freq: Once | RESPIRATORY_TRACT | Status: AC
  Administered 2024-04-22: 3 mL via RESPIRATORY_TRACT
  Filled 2024-04-22: qty 3

## 2024-04-22 MED ORDER — IOHEXOL 350 MG/ML SOLN
75.0000 mL | Freq: Once | INTRAVENOUS | Status: AC | PRN
  Administered 2024-04-22: 75 mL via INTRAVENOUS

## 2024-04-22 NOTE — ED Notes (Signed)
 Pt placed on 2 liters oxygen [place] in triage.

## 2024-04-22 NOTE — ED Provider Notes (Signed)
 [facility] Provider Note    Event Date/Time   First MD Initiated Contact with Patient 04/22/24 2108     (approximate)   History   Chief Complaint Shortness of Breath   HPI  Krista Gordon is a 83 y.o. female with past medical history of hypertension, hyperlipidemia, diabetes, gout, anemia, and ESRD on HD (TTS) who presents to the ED complaining of shortness of breath.  Patient reports that she began feeling short of breath during her dialysis session earlier today.  She was able to complete the session but continued to feel short of breath afterwards and so presented to the ED for evaluation.  She does state that she has been dealing with a productive cough over the past couple of days as well as some subjective fevers.  She has not had any pain in her chest and denies any pain or swelling in her legs.  She has not missed any recent dialysis sessions.     Physical Exam   Triage Vital Signs: ED Triage Vitals  Encounter Vitals Group     BP 04/22/24 2005 136/64     Girls Systolic BP Percentile --      Girls Diastolic BP Percentile --      Boys Systolic BP Percentile --      Boys Diastolic BP Percentile --      Pulse Rate 04/22/24 2005 (!) 106     Resp 04/22/24 2005 18     Temp 04/22/24 2005 98.3 F (36.8 C)     Temp Source 04/22/24 2005 Oral     SpO2 04/22/24 2005 (!) 85 %     Weight 04/22/24 2002 130 lb (59 kg)     Height 04/22/24 2002 5' 2 (1.575 m)     Head Circumference --      Peak Flow --      Pain Score 04/22/24 2002 0     Pain Loc --      Pain Education --      Exclude from Growth Chart --     Most recent vital signs: Vitals:   04/22/24 2005 04/22/24 2230  BP: 136/64 124/68  Pulse: (!) 106 98  Resp: 18 (!) 22  Temp: 98.3 F (36.8 C)   SpO2: (!) 85% 97%    Constitutional: Alert and oriented. Eyes: Conjunctivae are normal. Head: Atraumatic. Nose: No congestion/rhinnorhea. Mouth/Throat: Mucous membranes are moist.   Cardiovascular: Normal rate, regular rhythm. Grossly normal heart sounds.  2+ radial pulses bilaterally. Respiratory: Normal respiratory effort.  No retractions. Lungs CTAB. Gastrointestinal: Soft and nontender. No distention. Musculoskeletal: No lower extremity tenderness nor edema.  Neurologic:  Normal speech and language. No gross focal neurologic deficits are appreciated.    ED Results / Procedures / Treatments   Labs (all labs ordered are listed, but only abnormal results are displayed) Labs Reviewed  BASIC METABOLIC PANEL WITH GFR - Abnormal; Notable for the following components:      Result Value   Glucose, Bld 152 (*)    Creatinine, Ser 3.97 (*)    GFR, Estimated 11 (*)    All other components within normal limits  CBC - Abnormal; Notable for the following components:   RBC 3.42 (*)    Hemoglobin 10.3 (*)    HCT 34.0 (*)    Platelets 133 (*)    All other components within normal limits  TROPONIN I (HIGH SENSITIVITY) - Abnormal; Notable for the following components:   Troponin I (High Sensitivity)  53 (*)    All other components within normal limits  RESP PANEL BY RT-PCR (RSV, FLU A&B, COVID)  [id number]  TROPONIN I (HIGH SENSITIVITY)     EKG  ED ECG REPORT I, Carlin Palin, the attending physician, personally viewed and interpreted this ECG.   Date: 04/22/2024  EKG Time: 20:04  Rate: 105  Rhythm: sinus tachycardia  Axis: LAD  Intervals:left anterior fascicular block  ST&T Change: None  RADIOLOGY Chest x-ray reviewed and interpreted by me with no infiltrate, edema, or effusion.  PROCEDURES:  Critical Care performed: Yes, see critical care procedure note(s)  .Critical Care  Performed by: Palin Carlin, MD Authorized by: Palin Carlin, MD   Critical care provider statement:    Critical care time (minutes):  30   Critical care time was exclusive of:  Separately billable procedures and treating other patients and teaching time   Critical care was  necessary to treat or prevent imminent or life-threatening deterioration of the following conditions:  Respiratory failure   Critical care was time spent personally by me on the following activities:  Development of treatment plan with patient or surrogate, discussions with consultants, evaluation of patient's response to treatment, examination of patient, ordering and review of laboratory studies, ordering and review of radiographic studies, ordering and performing treatments and interventions, pulse oximetry, re-evaluation of patient's condition and review of old charts   I assumed direction of critical care for this patient from another provider in my specialty: no     Care discussed with: admitting provider      MEDICATIONS ORDERED IN ED: Medications  ipratropium-albuterol  (DUONEB) 0.5-2.5 (3) MG/3ML nebulizer solution 3 mL (3 mLs Nebulization Given 04/22/24 2238)  iohexol  (OMNIPAQUE ) 350 MG/ML injection 75 mL (75 mLs Intravenous Contrast Given 04/22/24 2249)     IMPRESSION / MDM / ASSESSMENT AND PLAN / ED COURSE  I reviewed the triage vital signs and the nursing notes.                              83 y.o. female with past medical history of hypertension, hyperlipidemia, diabetes, gout, and ESRD on HD who presents to the ED complaining of productive cough for the past 2 days with increasing difficulty breathing today.  Patient's presentation is most consistent with acute presentation with potential threat to life or bodily function.  Differential diagnosis includes, but is not limited to, ACS, PE, pneumonia, COPD, CHF, anemia, electrolyte abnormality, AKI, bronchitis.  Patient nontoxic-appearing and in no acute distress, vital signs remarkable for oxygen saturations of 85% on room air, improved on 2 L nasal cannula.  EKG shows no evidence of arrhythmia or ischemia, chest x-ray is unremarkable.  Symptoms sound most consistent with a bronchitis, but given new oxygen requirement will further  assess with CTA of her chest.  Troponin mildly elevated but suspect this is due to her ESRD, will trend.  Labs without significant anemia, leukocytosis, or electrolyte abnormality.  We will trial DuoNeb and reassess following imaging.  Patient turned over to oncoming provider pending CTA results and reassessment.      FINAL CLINICAL IMPRESSION(S) / ED DIAGNOSES   Final diagnoses:  Shortness of breath  Bronchitis     Rx / DC Orders   ED Discharge Orders     None        Note:  This document was prepared using Dragon voice recognition software and may include unintentional dictation errors.   Palin Carlin,  MD 04/22/24 2309

## 2024-04-22 NOTE — ED Triage Notes (Addendum)
 Pt ambulatory to triage.  Pt had dialysis this morning.  Pt reports sob after dialysis today.  No chest pain.  No n/v/d.  Pt alert  speech clear.  Pt on 2 liters oxygen [place] prn.

## 2024-04-23 DIAGNOSIS — I2489 Other forms of acute ischemic heart disease: Secondary | ICD-10-CM | POA: Diagnosis present

## 2024-04-23 DIAGNOSIS — J209 Acute bronchitis, unspecified: Secondary | ICD-10-CM

## 2024-04-23 DIAGNOSIS — J440 Chronic obstructive pulmonary disease with acute lower respiratory infection: Secondary | ICD-10-CM | POA: Diagnosis present

## 2024-04-23 DIAGNOSIS — J441 Chronic obstructive pulmonary disease with (acute) exacerbation: Secondary | ICD-10-CM | POA: Diagnosis present

## 2024-04-23 DIAGNOSIS — R7989 Other specified abnormal findings of blood chemistry: Secondary | ICD-10-CM

## 2024-04-23 DIAGNOSIS — Z8616 Personal history of COVID-19: Secondary | ICD-10-CM | POA: Diagnosis not present

## 2024-04-23 DIAGNOSIS — I120 Hypertensive chronic kidney disease with stage 5 chronic kidney disease or end stage renal disease: Secondary | ICD-10-CM | POA: Diagnosis present

## 2024-04-23 DIAGNOSIS — Z7983 Long term (current) use of bisphosphonates: Secondary | ICD-10-CM | POA: Diagnosis not present

## 2024-04-23 DIAGNOSIS — E1122 Type 2 diabetes mellitus with diabetic chronic kidney disease: Secondary | ICD-10-CM | POA: Diagnosis present

## 2024-04-23 DIAGNOSIS — Z79899 Other long term (current) drug therapy: Secondary | ICD-10-CM | POA: Diagnosis not present

## 2024-04-23 DIAGNOSIS — D631 Anemia in chronic kidney disease: Secondary | ICD-10-CM | POA: Diagnosis present

## 2024-04-23 DIAGNOSIS — E785 Hyperlipidemia, unspecified: Secondary | ICD-10-CM | POA: Diagnosis present

## 2024-04-23 DIAGNOSIS — M109 Gout, unspecified: Secondary | ICD-10-CM | POA: Diagnosis present

## 2024-04-23 DIAGNOSIS — Z90710 Acquired absence of both cervix and uterus: Secondary | ICD-10-CM | POA: Diagnosis not present

## 2024-04-23 DIAGNOSIS — J9621 Acute and chronic respiratory failure with hypoxia: Secondary | ICD-10-CM | POA: Diagnosis present

## 2024-04-23 DIAGNOSIS — J849 Interstitial pulmonary disease, unspecified: Secondary | ICD-10-CM

## 2024-04-23 DIAGNOSIS — Z9851 Tubal ligation status: Secondary | ICD-10-CM | POA: Diagnosis not present

## 2024-04-23 DIAGNOSIS — I272 Pulmonary hypertension, unspecified: Secondary | ICD-10-CM | POA: Diagnosis present

## 2024-04-23 DIAGNOSIS — N186 End stage renal disease: Secondary | ICD-10-CM | POA: Diagnosis present

## 2024-04-23 DIAGNOSIS — Z885 Allergy status to narcotic agent status: Secondary | ICD-10-CM | POA: Diagnosis not present

## 2024-04-23 DIAGNOSIS — R0602 Shortness of breath: Secondary | ICD-10-CM | POA: Diagnosis present

## 2024-04-23 DIAGNOSIS — Z992 Dependence on renal dialysis: Secondary | ICD-10-CM | POA: Diagnosis not present

## 2024-04-23 DIAGNOSIS — D638 Anemia in other chronic diseases classified elsewhere: Secondary | ICD-10-CM | POA: Insufficient documentation

## 2024-04-23 DIAGNOSIS — Z7982 Long term (current) use of aspirin: Secondary | ICD-10-CM | POA: Diagnosis not present

## 2024-04-23 DIAGNOSIS — Z1152 Encounter for screening for COVID-19: Secondary | ICD-10-CM | POA: Diagnosis not present

## 2024-04-23 DIAGNOSIS — Z87891 Personal history of nicotine dependence: Secondary | ICD-10-CM | POA: Diagnosis not present

## 2024-04-23 LAB — CBC
HCT: 29.3 % — ABNORMAL LOW (ref 36.0–46.0)
Hemoglobin: 9.1 g/dL — ABNORMAL LOW (ref 12.0–15.0)
MCH: 31.1 pg (ref 26.0–34.0)
MCHC: 31.1 g/dL (ref 30.0–36.0)
MCV: 100 fL (ref 80.0–100.0)
Platelets: 99 K/uL — ABNORMAL LOW (ref 150–400)
RBC: 2.93 MIL/uL — ABNORMAL LOW (ref 3.87–5.11)
RDW: 14.4 % (ref 11.5–15.5)
WBC: 6.2 K/uL (ref 4.0–10.5)
nRBC: 0 % (ref 0.0–0.2)

## 2024-04-23 LAB — RESP PANEL BY RT-PCR (RSV, FLU A&B, COVID)  RVPGX2
Influenza A by PCR: NEGATIVE
Influenza B by PCR: NEGATIVE
Resp Syncytial Virus by PCR: NEGATIVE
SARS Coronavirus 2 by RT PCR: NEGATIVE

## 2024-04-23 LAB — HEPATITIS B SURFACE ANTIGEN: Hepatitis B Surface Ag: NONREACTIVE

## 2024-04-23 LAB — RENAL FUNCTION PANEL
Albumin: 2.9 g/dL — ABNORMAL LOW (ref 3.5–5.0)
Anion gap: 11 (ref 5–15)
BUN: 25 mg/dL — ABNORMAL HIGH (ref 8–23)
CO2: 23 mmol/L (ref 22–32)
Calcium: 8.6 mg/dL — ABNORMAL LOW (ref 8.9–10.3)
Chloride: 101 mmol/L (ref 98–111)
Creatinine, Ser: 5.08 mg/dL — ABNORMAL HIGH (ref 0.44–1.00)
GFR, Estimated: 8 mL/min — ABNORMAL LOW (ref >60)
Glucose, Bld: 210 mg/dL — ABNORMAL HIGH (ref 70–99)
Phosphorus: 2.8 mg/dL (ref 2.5–4.6)
Potassium: 4.4 mmol/L (ref 3.5–5.1)
Sodium: 135 mmol/L (ref 135–145)

## 2024-04-23 LAB — TROPONIN I (HIGH SENSITIVITY): Troponin I (High Sensitivity): 59 ng/L — ABNORMAL HIGH (ref <18)

## 2024-04-23 MED ORDER — PREDNISONE 20 MG PO TABS
40.0000 mg | ORAL_TABLET | Freq: Every day | ORAL | Status: DC
  Administered 2024-04-23 – 2024-04-24 (×2): 40 mg via ORAL
  Filled 2024-04-23 (×2): qty 2

## 2024-04-23 MED ORDER — ACETAMINOPHEN 325 MG PO TABS
650.0000 mg | ORAL_TABLET | Freq: Four times a day (QID) | ORAL | Status: DC | PRN
  Filled 2024-04-23: qty 2

## 2024-04-23 MED ORDER — AMLODIPINE BESYLATE 5 MG PO TABS
5.0000 mg | ORAL_TABLET | Freq: Every day | ORAL | Status: DC
  Administered 2024-04-23 – 2024-04-24 (×2): 5 mg via ORAL
  Filled 2024-04-23 (×2): qty 1

## 2024-04-23 MED ORDER — LISINOPRIL 20 MG PO TABS
40.0000 mg | ORAL_TABLET | Freq: Every day | ORAL | Status: DC
  Administered 2024-04-23 – 2024-04-24 (×2): 40 mg via ORAL
  Filled 2024-04-23: qty 2
  Filled 2024-04-23: qty 4

## 2024-04-23 MED ORDER — CHLORHEXIDINE GLUCONATE CLOTH 2 % EX PADS
6.0000 | MEDICATED_PAD | Freq: Every day | CUTANEOUS | Status: DC
  Administered 2024-04-24: 6 via TOPICAL

## 2024-04-23 MED ORDER — ATORVASTATIN CALCIUM 20 MG PO TABS
40.0000 mg | ORAL_TABLET | Freq: Every day | ORAL | Status: DC
  Administered 2024-04-23: 40 mg via ORAL
  Filled 2024-04-23: qty 2

## 2024-04-23 MED ORDER — GUAIFENESIN ER 600 MG PO TB12
600.0000 mg | ORAL_TABLET | Freq: Two times a day (BID) | ORAL | Status: DC
  Administered 2024-04-23 – 2024-04-24 (×2): 600 mg via ORAL
  Filled 2024-04-23 (×2): qty 1

## 2024-04-23 MED ORDER — ATORVASTATIN CALCIUM 20 MG PO TABS: 40.0000 mg | ORAL_TABLET | Freq: Every day | ORAL | Status: DC

## 2024-04-23 MED ORDER — ACETAMINOPHEN 650 MG RE SUPP: 650.0000 mg | Freq: Four times a day (QID) | RECTAL | Status: DC | PRN

## 2024-04-23 MED ORDER — IPRATROPIUM-ALBUTEROL 0.5-2.5 (3) MG/3ML IN SOLN
3.0000 mL | Freq: Four times a day (QID) | RESPIRATORY_TRACT | Status: DC
  Administered 2024-04-23 (×3): 3 mL via RESPIRATORY_TRACT
  Filled 2024-04-23 (×3): qty 3

## 2024-04-23 MED ORDER — ASPIRIN 81 MG PO TBEC
81.0000 mg | DELAYED_RELEASE_TABLET | Freq: Every day | ORAL | Status: DC
  Administered 2024-04-23 – 2024-04-24 (×2): 81 mg via ORAL
  Filled 2024-04-23 (×2): qty 1

## 2024-04-23 MED ORDER — IPRATROPIUM-ALBUTEROL 0.5-2.5 (3) MG/3ML IN SOLN
3.0000 mL | Freq: Three times a day (TID) | RESPIRATORY_TRACT | Status: DC
  Administered 2024-04-24: 3 mL via RESPIRATORY_TRACT
  Filled 2024-04-23: qty 3

## 2024-04-23 MED ORDER — ONDANSETRON HCL 4 MG PO TABS
4.0000 mg | ORAL_TABLET | Freq: Four times a day (QID) | ORAL | Status: DC | PRN
Start: 2024-04-23 — End: 2024-04-25

## 2024-04-23 MED ORDER — ALBUTEROL SULFATE (2.5 MG/3ML) 0.083% IN NEBU: 2.5000 mg | INHALATION_SOLUTION | RESPIRATORY_TRACT | Status: DC | PRN

## 2024-04-23 MED ORDER — HEPARIN SODIUM (PORCINE) 5000 UNIT/ML IJ SOLN
5000.0000 [IU] | Freq: Three times a day (TID) | INTRAMUSCULAR | Status: DC
  Administered 2024-04-23 – 2024-04-24 (×4): 5000 [IU] via SUBCUTANEOUS
  Filled 2024-04-23 (×4): qty 1

## 2024-04-23 MED ORDER — ONDANSETRON HCL 4 MG/2ML IJ SOLN: 4.0000 mg | Freq: Four times a day (QID) | INTRAMUSCULAR | Status: DC | PRN

## 2024-04-23 NOTE — Hospital Course (Signed)
 Krista Gordon is a 83 y.o. 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   Possible COPD exacerbation, placed on steroids.

## 2024-04-23 NOTE — Assessment & Plan Note (Signed)
 Sliding scale insulin coverage

## 2024-04-23 NOTE — Progress Notes (Signed)
 [facility]  ROUNDING NOTE   Subjective:   Krista Gordon is 83y.o. female with past medical history of anemia, diabetes, and end stage renal disease on hemodialysis. Patient presents to ED with shortness of breath and cough. Patient will be admitted for Shortness of breath [R06.02] Bronchitis [J40] COPD exacerbation (HCC) [J44.1] COPD with acute exacerbation (HCC) [J44.1] Acute hypoxemic respiratory failure (HCC) [J96.01]  Patient is known to our practice from previous admissions. She receives outpatient dialysis at [facility] on a TTS schedule, supervised by [facility] physicians. Patient is a poor historian but states she has not missed any recent treatments. States she did receive a full treatment on Tuesday. States she has been having shortness of breath for a couple days.   Serologies unremarkable for renal patient. Troponin 53. Hgb 10.3. Respiratory panel negative for flu, RSV, and covid. Chest xray negative for acute findings. CT angio chest negative for acute findings.   We have been consulted to manage dialysis needs.    Objective:  Vital signs in last 24 hours:  Temp:  [98.2 F (36.8 C)-98.3 F (36.8 C)] 98.2 F (36.8 C) (07/09 1406) Pulse Rate:  [82-106] 82 (07/09 1900) Resp:  [18-32] 18 (07/09 1900) BP: (101-136)/(49-77) 112/69 (07/09 1900) SpO2:  [85 %-100 %] 95 % (07/09 1900) Weight:  [56.2 kg-59 kg] 56.2 kg (07/09 1730)  Weight change:  Filed Weights   04/22/24 2002 04/23/24 1730  Weight: 59 kg 56.2 kg    Intake/Output: No intake/output data recorded.   Intake/Output this shift:  No intake/output data recorded.  Physical Exam: General: NAD  Head: Normocephalic, atraumatic. Moist oral mucosal membranes  Eyes: Anicteric  Neck: Supple  Lungs:  Clear to auscultation, normal effort  Heart: Regular rate and rhythm  Abdomen:  Soft, nontender  Extremities: No peripheral edema.  Neurologic: Awake, alert, conversant  Skin: Warm,dry, no rash   Access: AVF    Basic Metabolic Panel: Recent Labs  Lab 04/22/24 2005 04/23/24 1745  NA 137 135  K 4.5 4.4  CL 100 101  CO2 28 23  GLUCOSE 152* 210*  BUN 18 25*  CREATININE 3.97* 5.08*  CALCIUM  9.1 8.6*  PHOS  --  2.8    Liver Function Tests: Recent Labs  Lab 04/23/24 1745  ALBUMIN  2.9*   No results for input(s): LIPASE, AMYLASE in the last 168 hours. No results for input(s): AMMONIA in the last 168 hours.  CBC: Recent Labs  Lab 04/22/24 2005 04/23/24 1745  WBC 8.0 6.2  HGB 10.3* 9.1*  HCT 34.0* 29.3*  MCV 99.4 100.0  PLT 133* 99*    Cardiac Enzymes: No results for input(s): CKTOTAL, CKMB, CKMBINDEX, TROPONINI in the last 168 hours.  BNP: Invalid input(s): POCBNP  CBG: No results for input(s): GLUCAP in the last 168 hours.  Microbiology: Results for orders placed or performed during the hospital encounter of 04/22/24  Resp panel by RT-PCR (RSV, Flu A&B, Covid) Anterior Nasal Swab     Status: None   Collection Time: 04/22/24 10:42 PM   Specimen: Anterior Nasal Swab  Result Value Ref Range Status   SARS Coronavirus 2 by RT PCR NEGATIVE NEGATIVE Final    Comment: (NOTE) SARS-CoV-2 target nucleic acids are NOT DETECTED.  The SARS-CoV-2 RNA is generally detectable in upper respiratory specimens during the acute phase of infection. The lowest concentration of SARS-CoV-2 viral copies this assay can detect is 138 copies/mL. A negative result does not preclude SARS-Cov-2 infection and should not be used  as the sole basis for treatment or other patient management decisions. A negative result may occur with  improper specimen collection/handling, submission of specimen other than nasopharyngeal swab, presence of viral mutation(s) within the areas targeted by this assay, and inadequate number of viral copies(<138 copies/mL). A negative result must be combined with clinical observations, patient history, and epidemiological information. The  expected result is Negative.  Fact Sheet for Patients:  BloggerCourse.com  Fact Sheet for Healthcare Providers:  SeriousBroker.it  This test is no t yet approved or cleared by the [place]  FDA and  has been authorized for detection and/or diagnosis of SARS-CoV-2 by FDA under an Emergency Use Authorization (EUA). This EUA will remain  in effect (meaning this test can be used) for the duration of the COVID-19 declaration under Section 564(b)(1) of the Act, 21 U.S.C.section 360bbb-3(b)(1), unless the authorization is terminated  or revoked sooner.       Influenza A by PCR NEGATIVE NEGATIVE Final   Influenza B by PCR NEGATIVE NEGATIVE Final    Comment: (NOTE) The Xpert Xpress SARS-CoV-2/FLU/RSV plus assay is intended as an aid in the diagnosis of influenza from Nasopharyngeal swab specimens and should not be used as a sole basis for treatment. Nasal washings and aspirates are unacceptable for Xpert Xpress SARS-CoV-2/FLU/RSV testing.  Fact Sheet for Patients: BloggerCourse.com  Fact Sheet for Healthcare Providers: SeriousBroker.it  This test is not yet approved or cleared by the [place]  FDA and has been authorized for detection and/or diagnosis of SARS-CoV-2 by FDA under an Emergency Use Authorization (EUA). This EUA will remain in effect (meaning this test can be used) for the duration of the COVID-19 declaration under Section 564(b)(1) of the Act, 21 U.S.C. section 360bbb-3(b)(1), unless the authorization is terminated or revoked.     Resp Syncytial Virus by PCR NEGATIVE NEGATIVE Final    Comment: (NOTE) Fact Sheet for Patients: BloggerCourse.com  Fact Sheet for Healthcare Providers: SeriousBroker.it  This test is not yet approved or cleared by the [place]  FDA and has been authorized for detection and/or  diagnosis of SARS-CoV-2 by FDA under an Emergency Use Authorization (EUA). This EUA will remain in effect (meaning this test can be used) for the duration of the COVID-19 declaration under Section 564(b)(1) of the Act, 21 U.S.C. section 360bbb-3(b)(1), unless the authorization is terminated or revoked.  Performed at [facility], [address] [place], [place] [address]     Coagulation Studies: No results for input(s): LABPROT, INR in the last 72 hours.  Urinalysis: No results for input(s): COLORURINE, LABSPEC, PHURINE, GLUCOSEU, HGBUR, BILIRUBINUR, KETONESUR, PROTEINUR, UROBILINOGEN, NITRITE, LEUKOCYTESUR in the last 72 hours.  Invalid input(s): APPERANCEUR    Imaging: CT Angio Chest PE W/Cm &/Or Wo Cm Result Date: 04/22/2024 CLINICAL DATA:  Pulmonary embolus suspected with high probability. Dialysis today with subsequent shortness of breath. EXAM: CT ANGIOGRAPHY CHEST WITH CONTRAST TECHNIQUE: Multidetector CT imaging of the chest was performed using the standard protocol during bolus administration of intravenous contrast. Multiplanar CT image reconstructions and MIPs were obtained to evaluate the vascular anatomy. RADIATION DOSE REDUCTION: This exam was performed according to the departmental dose-optimization program which includes automated exposure control, adjustment of the mA and/or kV according to patient size and/or use of iterative reconstruction technique. CONTRAST:  75mL OMNIPAQUE  IOHEXOL  350 MG/ML SOLN COMPARISON:  Chest radiograph 04/22/2024 FINDINGS: Cardiovascular: Technically adequate study with good opacification of the central and segmental pulmonary arteries. Moderate motion artifact. No focal filling defects. No evidence of significant pulmonary  embolus. Cardiac enlargement. Normal caliber thoracic aorta. No aortic dissection. Great vessel origins are patent. Mediastinum/Nodes: Esophagus is decompressed. 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 Thyroid gland is unremarkable. Scattered lymph nodes in the mediastinum are not pathologically enlarged. Lungs/Pleura: Minimal bilateral pleural effusions. Emphysematous changes in the lungs. Peripheral interstitial fibrosis with prominent honeycomb changes particularly in the bases. This may represent usual interstitial pneumonitis. Suggestion of hazy ground-glass opacities throughout the lungs which may indicate edema or active alveolitis. No pneumothorax. Upper Abdomen: No acute abnormalities. Musculoskeletal: Degenerative changes in the spine with bridging anterior osteophytes. No acute bony abnormalities. Review of the MIP images confirms the above findings. IMPRESSION: 1. No evidence of significant pulmonary embolus. 2. Cardiac enlargement. 3. Emphysematous changes in the lungs. Peripheral fibrosis with prominent honeycomb changes suggesting chronic interstitial lung disease. 4. Ground-glass opacities throughout the lungs may indicate edema or active alveolitis. Electronically Signed   By: Elsie Gravely M.D.   On: 04/22/2024 23:00   DG Chest Port 1 View Result Date: 04/22/2024 CLINICAL DATA:  [id number] SOB (shortness of breath) [id number] EXAM: PORTABLE CHEST 1 VIEW COMPARISON:  Chest x-ray 06/13/2022 FINDINGS: The heart and mediastinal contours are unchanged. No focal consolidation. Chronic coarsened interstitial marking with no overt pulmonary edema. Nonspecific blunting of bilateral costophrenic angles. No significant pleural effusion. No pneumothorax. No acute osseous abnormality. Left axillary vascular stent. IMPRESSION: No active disease. Underlying pulmonary fibrosis/interstitial lung disease. Electronically Signed   By: Morgane  Naveau M.D.   On: 04/22/2024 20:41     Medications:     amLODipine   5 mg Oral Daily   aspirin  EC  81 mg Oral Daily   atorvastatin   40 mg Oral QHS   [START ON 04/24/2024] Chlorhexidine  Gluconate Cloth  6 each Topical Q0600   guaiFENesin   600 mg  Oral BID   heparin   5,000 Units Subcutaneous Q8H   ipratropium-albuterol   3 mL Nebulization Q6H   lisinopril   40 mg Oral Daily   predniSONE   40 mg Oral Q breakfast   acetaminophen  **OR** acetaminophen , albuterol , ondansetron  **OR** ondansetron  (ZOFRAN ) IV  Assessment/ Plan:  Krista Gordon is a 83 y.o.  female with past medical history of anemia, diabetes, and end stage renal disease on hemodialysis. Patient presents to ED with shortness of breath and cough. Patient will be admitted for Shortness of breath [R06.02] Bronchitis [J40] COPD exacerbation (HCC) [J44.1] COPD with acute exacerbation (HCC) [J44.1] Acute hypoxemic respiratory failure (HCC) [J96.01]   End-stage renal disease on hemodialysis.  Last treatment received on Tuesday.  Patient agreeable to UF only treatment today, UF goal 1.5 to 2 L as tolerated.  Patient will receive scheduled dialysis tomorrow to maintain outpatient schedule.  2.  Acute respiratory failure, suspected COPD with acute bronchitis, chest x-ray negative for acute findings.  CT angio chest shows presence of interstitial lung disease.  Currently on 2 L nasal cannula.  Uses oxygen as needed at baseline.  Continue supportive measures.  Will receive additional dialysis treatment today.  3. Anemia of chronic kidney disease Lab Results  Component Value Date   HGB 9.1 (L) 04/23/2024    Hemoglobin acceptable at this time.  Will continue to monitor and assess need for ESA's.  4. Secondary Hyperparathyroidism: with outpatient labs:  Lab Results  Component Value Date   CALCIUM  8.6 (L) 04/23/2024   CAION 1.05 (L) 06/05/2022   PHOS 2.8 04/23/2024    Calcium  and phosphorus within optimal range for renal patient.  Will continue to monitor for  now.   LOS: 0 Veasna Santibanez 7/9/20257:32 PM

## 2024-04-23 NOTE — Assessment & Plan Note (Signed)
Continue home amlodipine and lisinopril. ?

## 2024-04-23 NOTE — ED Notes (Signed)
 Report given to [facility].

## 2024-04-23 NOTE — Assessment & Plan Note (Signed)
Nephrology consult for continuation of dialysis 

## 2024-04-23 NOTE — ED Notes (Signed)
 Nurse Olam and myself slid patient  up in the bed at this time.

## 2024-04-23 NOTE — Progress Notes (Signed)
  Progress Note   Patient: Krista Gordon FMW:[id number] DOB: 01-Sep-1941 DOA: 04/22/2024     0 DOS: the patient was seen and examined on 04/23/2024   Brief hospital course:  Krista Gordon is a 83 y.o. 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   Possible COPD exacerbation, placed on steroids.   Principal Problem:   COPD with acute bronchitis (HCC) Active Problems:   Acute on chronic respiratory failure with hypoxia (HCC)   Interstitial lung disease (HCC)   ESRD on hemodialysis (HCC)   Elevated troponin   Diabetes (HCC)   Essential hypertension   Anemia of chronic disease   COPD exacerbation (HCC)   Severe pulmonary hypertension (HCC)   Assessment and Plan: * COPD with acute bronchitis (HCC) Acute on chronic respiratory failure with hypoxia Interstitial lung disease Severe pulmonary hypertension. Patient was tachycardic and hypoxic to 85 on arrival.  Uses O2 at home as needed only Patient had echocardiogram performed in 2023, showed severe pulmonary hypertension with normal ejection fraction. At this point, patient will be continued on steroids and scheduled bronchodilator.  Probably can go home tomorrow if her condition improves.  Elevated troponin secondary to demand ischemia Likely demand ischemia, has basically flat 53-59 No complaints of chest pain and EKG nonacute  ESRD on hemodialysis (HCC) Anemia of chronic kidney disease. Nephrology consult for continuation of dialysis  Essential hypertension Continue home amlodipine  and lisinopril   Diabetes (HCC) Sliding scale insulin  coverage       Subjective:  Patient feels better today with shortness of breath.  Still have a cough, nonproductive.  No chest pain  Physical Exam: Vitals:    04/23/24 0946 04/23/24 1103 04/23/24 1406 04/23/24 1607  BP:  118/63 125/61   Pulse:  90 90 90  Resp:  20 20 (!) 26  Temp: 98.3 F (36.8 C) 98.2 F (36.8 C) 98.2 F (36.8 C)   TempSrc:  Oral Oral   SpO2:  95% 95% 97%  Weight:      Height:       General exam: Appears calm and comfortable  Respiratory system: Breathing sounds with crackles in the base. Respiratory effort normal. Cardiovascular system: S1 & S2 heard, RRR. No JVD, murmurs, rubs, gallops or clicks. No pedal edema. Gastrointestinal system: Abdomen is nondistended, soft and nontender. No organomegaly or masses felt. Normal bowel sounds heard. Central nervous system: Alert and oriented. No focal neurological deficits. Extremities: Symmetric 5 x 5 power. Skin: No rashes, lesions or ulcers Psychiatry: Judgement and insight appear normal. Mood & affect appropriate.    Data Reviewed:  Chest CT scan lab results reviewed  Family Communication: Sister updated over the phone  Disposition: Status is: Inpatient Remains inpatient appropriate because: Severity of disease,     Time spent: None minutes  Author: Murvin Mana, MD 04/23/2024 4:10 PM  For on call review [email or web address].

## 2024-04-23 NOTE — H&P (Signed)
 History and Physical    Patient: Krista Gordon FMW:[id number] DOB: [birth year] DOA: 04/22/2024 DOS: the patient was seen and examined on 04/23/2024 PCP: [place] HERO, MD  Patient coming from: Home  Chief Complaint:  Chief Complaint  Patient presents with   Shortness of Breath    HPI: Krista Gordon is a 83 y.o. 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  She denied chest pain, fever or chills, lower extremity pain or swelling.  Denied any missed dialysis sessions. In the ED she was tachypneic to the 30s and hypoxic to 85% on room air requiring 3 L to maintainSats in the mid 90s, afebrile, mildly tachycardic to 106 with SBP in the 120s to 130s. CBC notable for normal WBC, with hemoglobin of 10.3, baseline mostly around 9.  BMP at baseline. Troponin 53--> 59 Respiratory viral panel negative. EKG showed sinus tachycardia at 105 with nonspecific ST-T wave changes.  CTA PE protocol negative for PE, showing emphysematous changes in the lungs and findings consistent with known interstitial lung disease.  Groundglass opacities throughout the lungs may indicate edema or active alveolitis. Patient treated with DuoNebs. Admission requested     Review of Systems: As mentioned in the history of present illness. All other systems reviewed and are negative.  Past Medical History:  Diagnosis Date   Anemia    COVID    patient states had COVID over a month ago and in hospital 3 days   Diabetes mellitus without complication (HCC)    ESRD on hemodialysis (HCC)    kidney disease   Gall stone    Gout    Hyperlipidemia    Hypertension    Past Surgical History:  Procedure Laterality Date   A/V FISTULAGRAM Left 12/05/2021   Procedure: A/V Fistulagram;  Surgeon: Krista Selinda RAMAN, MD;  Location: [facility];  Service: Cardiovascular;  Laterality: Left;   A/V FISTULAGRAM Left 12/24/2023   Procedure: A/V Fistulagram;  Surgeon: Krista Selinda RAMAN, MD;  Location: [facility];  Service: Cardiovascular;  Laterality: Left;   A/V SHUNTOGRAM Left 02/16/2020   Procedure: A/V SHUNTOGRAM;  Surgeon: Krista Selinda RAMAN, MD;  Location: [facility];  Service: Cardiovascular;  Laterality: Left;   AV FISTULA PLACEMENT Left 01/15/2020   Procedure: INSERTION OF ARTERIOVENOUS (AV) GORE-TEX GRAFT ARM;  Surgeon: Krista Selinda RAMAN, MD;  Location: [facility] OR[facility];  Service: Vascular;  Laterality: Left;   COLONOSCOPY WITH PROPOFOL  N/A 01/27/2016   Procedure: COLONOSCOPY WITH PROPOFOL ;  Surgeon: Krista CINDERELLA Piedmont, MD;  Location: [facility];  Service: Gastroenterology;  Laterality: N/A;   ROBOTIC ASSISTED LAPAROSCOPIC SACROCOLPOPEXY Bilateral 06/02/2022   Procedure: XI ROBOTIC ASSISTED LAPAROSCOPIC SACROCOLPOPEXY SUPRACEVICAL HYSTERECTOMY AND BILATERAL SALPINGO OOPHERECTOMY;  Surgeon: Krista Morene ORN, MD;  Location: [facility];  Service: Urology;  Laterality: Bilateral;  4.5 HRS FOR THIS CASE   TUBAL LIGATION     Social History:  reports that she has quit smoking. She has never used smokeless tobacco. She reports that she does not drink alcohol and does not use drugs.  Allergies  Allergen Reactions   Mircera [Methoxy Polyethylene Glycol-Epoetin Beta]     Patient doesn't recall this allergy   Tramadol  Itching    Family History  Problem Relation Age of Onset  Breast cancer Sister 50    Prior to Admission medications   Medication Sig Start Date End Date Taking? Authorizing Provider  alendronate  (FOSAMAX ) 70 MG tablet Take 70 mg by mouth once a week. Patient not taking: Reported on 12/24/2023 11/19/14   [provider]  amLODipine  (NORVASC ) 5 MG tablet Take 5 mg by mouth daily. 01/02/22   [provider]  aspirin  EC 81 MG tablet Take 1 tablet (81 mg total) by mouth daily.  Swallow whole. 06/11/22   Elgergawy, Krista RAMAN, MD  atorvastatin  (LIPITOR) 40 MG tablet Take 40 mg by mouth at bedtime.     [provider]  colchicine  0.6 MG tablet Take 0.6 mg by mouth every other day.    [provider]  estradiol  (ESTRACE  VAGINAL) 0.1 MG/GM vaginal cream Place 1 Applicatorful vaginally 3 (three) times a week. Use 1 small dolyp of cream on tip of index finger and swap the inside of the vagina 06/02/22   Krista Morene ORN, MD  lisinopril  (ZESTRIL ) 40 MG tablet Take 40 mg by mouth daily.    [provider]  loratadine (CLARITIN) 10 MG tablet Take 10 mg by mouth every other day.    [provider]  oxyCODONE -acetaminophen  (PERCOCET) 5-325 MG tablet Take 1 tablet by mouth every 4 (four) hours as needed for severe pain. Patient not taking: Reported on 12/24/2023 10/27/22   Krista Drivers, MD  pantoprazole  (PROTONIX ) 40 MG tablet Take 1 tablet (40 mg total) by mouth daily. Patient not taking: Reported on 12/24/2023 06/11/22 12/10/25  Elgergawy, Krista RAMAN, MD  PROAIR  HFA 108 (90 Base) MCG/ACT inhaler Inhale 2 puffs into the lungs every 6 (six) hours as needed for shortness of breath. 06/15/18   [provider]  sodium bicarbonate  650 MG tablet Take 1,300 mg by mouth 2 (two) times daily.    [provider]    Physical Exam: Vitals:   04/22/24 2005 04/22/24 2230 04/22/24 2300 04/23/24 0000  BP: 136/64 124/68 131/62 121/62  Pulse: (!) 106 98 97 92  Resp: 18 (!) 22 (!) 32 (!) 32  Temp: 98.3 F (36.8 C)     TempSrc: Oral     SpO2: (!) 85% 97% 94% 96%  Weight:      Height:       Physical Exam Vitals and nursing note reviewed.  Constitutional:      General: She is not in acute distress.    Comments: Frail-appearing female  HENT:     Head: Normocephalic and atraumatic.  Cardiovascular:     Rate and Rhythm: Normal rate and regular rhythm.     Heart sounds: Normal heart sounds.  Pulmonary:     Effort: Tachypnea present.      Comments: Coarse breath sounds Abdominal:     Palpations: Abdomen is soft.     Tenderness: There is no abdominal tenderness.  Neurological:     Mental Status: Mental status is at baseline.     Labs on Admission: I have personally reviewed following labs and imaging studies  CBC: Recent Labs  Lab 04/22/24 2005  WBC 8.0  HGB 10.3*  HCT 34.0*  MCV 99.4  PLT 133*   Basic Metabolic Panel: Recent Labs  Lab 04/22/24 2005  NA 137  K 4.5  CL 100  CO2 28  GLUCOSE 152*  BUN 18  CREATININE 3.97*  CALCIUM  9.1   GFR: Estimated Creatinine Clearance: 8.8 mL/min (A) (by C-G formula based on SCr of 3.97 mg/dL (H)). Liver Function  Tests: No results for input(s): AST, ALT, ALKPHOS, BILITOT, PROT, ALBUMIN  in the last 168 hours. No results for input(s): LIPASE, AMYLASE in the last 168 hours. No results for input(s): AMMONIA in the last 168 hours. Coagulation Profile: No results for input(s): INR, PROTIME in the last 168 hours. Cardiac Enzymes: No results for input(s): CKTOTAL, CKMB, CKMBINDEX, TROPONINI in the last 168 hours. BNP (last 3 results) No results for input(s): PROBNP in the last 8760 hours. HbA1C: No results for input(s): HGBA1C in the last 72 hours. CBG: No results for input(s): GLUCAP in the last 168 hours. Lipid Profile: No results for input(s): CHOL, HDL, LDLCALC, TRIG, CHOLHDL, LDLDIRECT in the last 72 hours. Thyroid Function Tests: No results for input(s): TSH, T4TOTAL, FREET4, T3FREE, THYROIDAB in the last 72 hours. Anemia Panel: No results for input(s): VITAMINB12, FOLATE, FERRITIN, TIBC, IRON, RETICCTPCT in the last 72 hours. Urine analysis:    Component Value Date/Time   COLORURINE YELLOW 06/05/2022 1319   APPEARANCEUR CLOUDY (A) 06/05/2022 1319   APPEARANCEUR Cloudy (A) 07/04/2021 1054   LABSPEC 1.016 06/05/2022 1319   PHURINE 5.0 06/05/2022 1319   GLUCOSEU NEGATIVE 06/05/2022 1319    HGBUR LARGE (A) 06/05/2022 1319   BILIRUBINUR NEGATIVE 06/05/2022 1319   BILIRUBINUR Negative 07/04/2021 1054   KETONESUR NEGATIVE 06/05/2022 1319   PROTEINUR 100 (A) 06/05/2022 1319   NITRITE NEGATIVE 06/05/2022 1319   LEUKOCYTESUR LARGE (A) 06/05/2022 1319    Radiological Exams on Admission: CT Angio Chest PE W/Cm &/Or Wo Cm Result Date: 04/22/2024 CLINICAL DATA:  Pulmonary embolus suspected with high probability. Dialysis today with subsequent shortness of breath. EXAM: CT ANGIOGRAPHY CHEST WITH CONTRAST TECHNIQUE: Multidetector CT imaging of the chest was performed using the standard protocol during bolus administration of intravenous contrast. Multiplanar CT image reconstructions and MIPs were obtained to evaluate the vascular anatomy. RADIATION DOSE REDUCTION: This exam was performed according to the departmental dose-optimization program which includes automated exposure control, adjustment of the mA and/or kV according to patient size and/or use of iterative reconstruction technique. CONTRAST:  75mL OMNIPAQUE  IOHEXOL  350 MG/ML SOLN COMPARISON:  Chest radiograph 04/22/2024 FINDINGS: Cardiovascular: Technically adequate study with good opacification of the central and segmental pulmonary arteries. Moderate motion artifact. No focal filling defects. No evidence of significant pulmonary embolus. Cardiac enlargement. Normal caliber thoracic aorta. No aortic dissection. Great vessel origins are patent. Mediastinum/Nodes: Esophagus is decompressed. Small esophageal hiatal hernia behind the heart. Thyroid gland is unremarkable. Scattered lymph nodes in the mediastinum are not pathologically enlarged. Lungs/Pleura: Minimal bilateral pleural effusions. Emphysematous changes in the lungs. Peripheral interstitial fibrosis with prominent honeycomb changes particularly in the bases. This may represent usual interstitial pneumonitis. Suggestion of hazy ground-glass opacities throughout the lungs which may  indicate edema or active alveolitis. No pneumothorax. Upper Abdomen: No acute abnormalities. Musculoskeletal: Degenerative changes in the spine with bridging anterior osteophytes. No acute bony abnormalities. Review of the MIP images confirms the above findings. IMPRESSION: 1. No evidence of significant pulmonary embolus. 2. Cardiac enlargement. 3. Emphysematous changes in the lungs. Peripheral fibrosis with prominent honeycomb changes suggesting chronic interstitial lung disease. 4. Ground-glass opacities throughout the lungs may indicate edema or active alveolitis. Electronically Signed   By: Elsie Gravely M.D.   On: 04/22/2024 23:00   DG Chest Port 1 View Result Date: 04/22/2024 CLINICAL DATA:  [id number] SOB (shortness of breath) [id number] EXAM: PORTABLE CHEST 1 VIEW COMPARISON:  Chest x-ray 06/13/2022 FINDINGS: The heart and mediastinal contours are unchanged. No focal consolidation. Chronic coarsened interstitial  marking with no overt pulmonary edema. Nonspecific blunting of bilateral costophrenic angles. No significant pleural effusion. No pneumothorax. No acute osseous abnormality. Left axillary vascular stent. IMPRESSION: No active disease. Underlying pulmonary fibrosis/interstitial lung disease. Electronically Signed   By: Morgane  Naveau M.D.   On: 04/22/2024 20:41   Data Reviewed for HPI: Relevant notes from primary care and specialist visits, past discharge summaries as available in EHR, including Care Everywhere. Prior diagnostic testing as pertinent to current admission diagnoses Updated medications and problem lists for reconciliation ED course, including vitals, labs, imaging, treatment and response to treatment Triage notes, nursing and pharmacy notes and ED provider's notes Notable results as noted above in HPI      Assessment and Plan: * COPD with acute bronchitis (HCC) Acute on chronic respiratory failure with hypoxia Interstitial lung disease Schedule and as needed nebulized  bronchodilators Patient was tachycardic and hypoxic to 85 on arrival.  Uses O2 at home as needed only IV steroids Antitussives, flutter valve Can consider outpatient pulmonology referral-does not follow with pulmonology  Elevated troponin Likely demand ischemia, has basically flat 53-59 No complaints of chest pain and EKG nonacute  ESRD on hemodialysis [facility]) Nephrology consult for continuation of dialysis  Anemia of chronic disease At baseline  Essential hypertension Continue home amlodipine  and lisinopril   Diabetes (HCC) Sliding scale insulin  coverage     DVT prophylaxis: Lovenox  Consults: Renal, Dr. Douglas  Advance Care Planning:   Code Status: Prior   Family Communication: none  Disposition Plan: Back to previous home environment  Severity of Illness: The appropriate patient status for this patient is OBSERVATION. Observation status is judged to be reasonable and necessary in order to provide the required intensity of service to ensure the patient's safety. The patient's presenting symptoms, physical exam findings, and initial radiographic and laboratory data in the context of their medical condition is felt to place them at decreased risk for further clinical deterioration. Furthermore, it is anticipated that the patient will be medically stable for discharge from the hospital within 2 midnights of admission.   Author: Delayne LULLA Solian, MD 04/23/2024 1:11 AM  For on call review [email or web address].

## 2024-04-23 NOTE — Progress Notes (Signed)
  Received patient in bed to unit.   Informed consent signed and in chart.    TX duration: 2hrs     Transported back to floor Hand-off given to patient's nurse. No c/o and no acute distress noted    Access used: L AVG Access issues: none   Total UF removed: 1.5L Medication(s) given: none Post HD VS: wnl      Olivia Hurst LPN [facility]

## 2024-04-23 NOTE — ED Notes (Signed)
 This tech called [facility] and spoke with Ragena to place pt on cardiac monitor.

## 2024-04-23 NOTE — Assessment & Plan Note (Signed)
 Acute on chronic respiratory failure with hypoxia Interstitial lung disease Schedule and as needed nebulized bronchodilators Patient was tachycardic and hypoxic to 85 on arrival.  Uses O2 at home as needed only IV steroids Antitussives, flutter valve Can consider outpatient pulmonology referral-does not follow with pulmonology

## 2024-04-23 NOTE — Assessment & Plan Note (Addendum)
 Likely demand ischemia, has basically flat 53-59 No complaints of chest pain and EKG nonacute

## 2024-04-23 NOTE — Assessment & Plan Note (Signed)
 At baseline

## 2024-04-23 NOTE — ED Provider Notes (Signed)
 Followed up on CT angiogram with no PE. Patient quite dyspneic with exertion and new oxygen requirement.  Admission.   Cyrena Mylar, MD 04/23/24 [phone]

## 2024-04-24 DIAGNOSIS — J9621 Acute and chronic respiratory failure with hypoxia: Secondary | ICD-10-CM | POA: Diagnosis not present

## 2024-04-24 DIAGNOSIS — J440 Chronic obstructive pulmonary disease with acute lower respiratory infection: Secondary | ICD-10-CM | POA: Diagnosis not present

## 2024-04-24 DIAGNOSIS — N186 End stage renal disease: Secondary | ICD-10-CM | POA: Diagnosis not present

## 2024-04-24 DIAGNOSIS — Z992 Dependence on renal dialysis: Secondary | ICD-10-CM

## 2024-04-24 DIAGNOSIS — J209 Acute bronchitis, unspecified: Secondary | ICD-10-CM | POA: Diagnosis not present

## 2024-04-24 LAB — RENAL FUNCTION PANEL
Albumin: 3.5 g/dL (ref 3.5–5.0)
Anion gap: 13 (ref 5–15)
BUN: 39 mg/dL — ABNORMAL HIGH (ref 8–23)
CO2: 22 mmol/L (ref 22–32)
Calcium: 9.5 mg/dL (ref 8.9–10.3)
Chloride: 100 mmol/L (ref 98–111)
Creatinine, Ser: 6.08 mg/dL — ABNORMAL HIGH (ref 0.44–1.00)
GFR, Estimated: 6 mL/min — ABNORMAL LOW (ref >60)
Glucose, Bld: 153 mg/dL — ABNORMAL HIGH (ref 70–99)
Phosphorus: 2.9 mg/dL (ref 2.5–4.6)
Potassium: 5.4 mmol/L — ABNORMAL HIGH (ref 3.5–5.1)
Sodium: 135 mmol/L (ref 135–145)

## 2024-04-24 LAB — CBC
HCT: 32 % — ABNORMAL LOW (ref 36.0–46.0)
Hemoglobin: 10 g/dL — ABNORMAL LOW (ref 12.0–15.0)
MCH: 30.8 pg (ref 26.0–34.0)
MCHC: 31.3 g/dL (ref 30.0–36.0)
MCV: 98.5 fL (ref 80.0–100.0)
Platelets: 142 K/uL — ABNORMAL LOW (ref 150–400)
RBC: 3.25 MIL/uL — ABNORMAL LOW (ref 3.87–5.11)
RDW: 14.4 % (ref 11.5–15.5)
WBC: 11.2 K/uL — ABNORMAL HIGH (ref 4.0–10.5)
nRBC: 0 % (ref 0.0–0.2)

## 2024-04-24 MED ORDER — ALBUTEROL SULFATE (2.5 MG/3ML) 0.083% IN NEBU: 2.5000 mg | INHALATION_SOLUTION | RESPIRATORY_TRACT | 0 refills | Status: AC | PRN

## 2024-04-24 MED ORDER — PREDNISONE 20 MG PO TABS
ORAL_TABLET | ORAL | 0 refills | Status: AC
Start: 2024-04-25 — End: 2024-05-03

## 2024-04-24 NOTE — Plan of Care (Signed)
   Problem: Education: Goal: Knowledge of disease or condition will improve Outcome: Progressing   Problem: Activity: Goal: Ability to tolerate increased activity will improve Outcome: Progressing

## 2024-04-24 NOTE — TOC Transition Note (Signed)
 Transition of Care [facility]) - Discharge Note   Patient Details  Name: Krista Gordon MRN: [id number] Date of Birth: 09/24/1941  [facility]) CM/SW Contact:  Alvaro Louder, LCSW Phone Number: 04/24/2024, 11:04 AM   Clinical Narrative:    No further concerns or TOC needs  TOC to Follow for discharge    Final next level of care: Home/Self Care Barriers to Discharge: No Barriers Identified   Patient Goals and CMS Choice     Choice offered to / list presented to : Patient      Discharge Placement                    Patient and family notified of of transfer: 04/24/24  Discharge Plan and Services Additional resources added to the After Visit Summary for                  DME Arranged: Nebulizer machine DME Agency: [employer] Date DME Agency Contacted: 04/24/24   Representative spoke with at DME Agency: Thomasina            Social Drivers of Health (SDOH) Interventions SDOH Screenings   Food Insecurity: No Food Insecurity (04/24/2024)  Housing: Low Risk  (04/24/2024)  Transportation Needs: No Transportation Needs (04/24/2024)  Utilities: Not At Risk (04/24/2024)  Social Connections: Socially Integrated (04/24/2024)  Tobacco Use: Medium Risk (04/22/2024)     Readmission Risk Interventions     No data to display

## 2024-04-24 NOTE — TOC Transition Note (Signed)
 Transition of Care  [facility]) - Discharge Note   Patient Details  Name: Krista Gordon MRN: [id number] Date of Birth: 07/19/1941  Transition of Care [facility]) CM/SW Contact:  Lauraine JAYSON Carpen, LCSW Phone Number: 04/24/2024, 10:55 AM   Clinical Narrative:  Patient has orders to discharge home today. MD entered order for nebulizer machine. Patient is agreeable and okay with ordering through [employer] since she gets her home oxygen through them. Per [employer], patient's home oxygen orders are for 1 L continuous. No further concerns. CSW signing off.   Final next level of care: Home/Self Care Barriers to Discharge: No Barriers Identified   Patient Goals and CMS Choice     Choice offered to / list presented to : Patient      Discharge Placement                    Patient and family notified of of transfer: 04/24/24  Discharge Plan and Services Additional resources added to the After Visit Summary for                  DME Arranged: Nebulizer machine DME Agency: [employer] Date DME Agency Contacted: 04/24/24   Representative spoke with at DME Agency: Thomasina            Social Drivers of Health (SDOH) Interventions SDOH Screenings   Food Insecurity: No Food Insecurity (04/24/2024)  Housing: Low Risk  (04/24/2024)  Transportation Needs: No Transportation Needs (04/24/2024)  Utilities: Not At Risk (04/24/2024)  Social Connections: Socially Integrated (04/24/2024)  Tobacco Use: Medium Risk (04/22/2024)     Readmission Risk Interventions     No data to display

## 2024-04-24 NOTE — Progress Notes (Signed)
 [facility]  ROUNDING NOTE   Subjective:   Krista Gordon is 83y.o. female with past medical history of anemia, diabetes, and end stage renal disease on hemodialysis. Patient presents to ED with shortness of breath and cough. Patient will be admitted for Shortness of breath [R06.02] Bronchitis [J40] COPD exacerbation (HCC) [J44.1] COPD with acute exacerbation (HCC) [J44.1] Acute hypoxemic respiratory failure (HCC) [J96.01]  Patient is known to our practice from previous admissions. She receives outpatient dialysis at [facility] on a TTS schedule, supervised by [facility] physicians.   Update: Patient seen sitting up in bed Went to 2 L nasal cannula Denies any recent shortness of breath  Scheduled for dialysis later today   Objective:  Vital signs in last 24 hours:  Temp:  [97.8 F (36.6 C)-98.6 F (37 C)] 98.2 F (36.8 C) (07/10 0749) Pulse Rate:  [78-97] 93 (07/10 0832) Resp:  [16-27] 18 (07/10 0749) BP: (94-131)/(51-82) 119/59 (07/10 0749) SpO2:  [95 %-100 %] 100 % (07/10 0832) Weight:  [56.2 kg] 56.2 kg (07/09 1730)  Weight change: -2.768 kg Filed Weights   04/22/24 2002 04/23/24 1730  Weight: 59 kg 56.2 kg    Intake/Output: I/O last 3 completed shifts: In: -  Out: 1500 [Other:1500]   Intake/Output this shift:  Total I/O In: 480 [P.O.:480] Out: -   Physical Exam: General: NAD  Head: Normocephalic, atraumatic. Moist oral mucosal membranes  Eyes: Anicteric  Neck: Supple  Lungs:  Clear to auscultation, normal effort  Heart: Regular rate and rhythm  Abdomen:  Soft, nontender  Extremities: No peripheral edema.  Neurologic: Awake, alert, conversant  Skin: Warm,dry, no rash  Access: AVF    Basic Metabolic Panel: Recent Labs  Lab 04/22/24 2005 04/23/24 1745  NA 137 135  K 4.5 4.4  CL 100 101  CO2 28 23  GLUCOSE 152* 210*  BUN 18 25*  CREATININE 3.97* 5.08*  CALCIUM  9.1 8.6*  PHOS  --  2.8    Liver Function Tests: Recent Labs   Lab 04/23/24 1745  ALBUMIN  2.9*   No results for input(s): LIPASE, AMYLASE in the last 168 hours. No results for input(s): AMMONIA in the last 168 hours.  CBC: Recent Labs  Lab 04/22/24 2005 04/23/24 1745  WBC 8.0 6.2  HGB 10.3* 9.1*  HCT 34.0* 29.3*  MCV 99.4 100.0  PLT 133* 99*    Cardiac Enzymes: No results for input(s): CKTOTAL, CKMB, CKMBINDEX, TROPONINI in the last 168 hours.  BNP: Invalid input(s): POCBNP  CBG: No results for input(s): GLUCAP in the last 168 hours.  Microbiology: Results for orders placed or performed during the hospital encounter of 04/22/24  Resp panel by RT-PCR (RSV, Flu A&B, Covid) Anterior Nasal Swab     Status: None   Collection Time: 04/22/24 10:42 PM   Specimen: Anterior Nasal Swab  Result Value Ref Range Status   SARS Coronavirus 2 by RT PCR NEGATIVE NEGATIVE Final    Comment: (NOTE) SARS-CoV-2 target nucleic acids are NOT DETECTED.  The SARS-CoV-2 RNA is generally detectable in upper respiratory specimens during the acute phase of infection. The lowest concentration of SARS-CoV-2 viral copies this assay can detect is 138 copies/mL. A negative result does not preclude SARS-Cov-2 infection and should not be used as the sole basis for treatment or other patient management decisions. A negative result may occur with  improper specimen collection/handling, submission of specimen other than nasopharyngeal swab, presence of viral mutation(s) within the areas targeted by this assay, and  inadequate number of viral copies(<138 copies/mL). A negative result must be combined with clinical observations, patient history, and epidemiological information. The expected result is Negative.  Fact Sheet for Patients:  BloggerCourse.com  Fact Sheet for Healthcare Providers:  SeriousBroker.it  This test is no t yet approved or cleared by the [place]  FDA and  has been  authorized for detection and/or diagnosis of SARS-CoV-2 by FDA under an Emergency Use Authorization (EUA). This EUA will remain  in effect (meaning this test can be used) for the duration of the COVID-19 declaration under Section 564(b)(1) of the Act, 21 U.S.C.section 360bbb-3(b)(1), unless the authorization is terminated  or revoked sooner.       Influenza A by PCR NEGATIVE NEGATIVE Final   Influenza B by PCR NEGATIVE NEGATIVE Final    Comment: (NOTE) The Xpert Xpress SARS-CoV-2/FLU/RSV plus assay is intended as an aid in the diagnosis of influenza from Nasopharyngeal swab specimens and should not be used as a sole basis for treatment. Nasal washings and aspirates are unacceptable for Xpert Xpress SARS-CoV-2/FLU/RSV testing.  Fact Sheet for Patients: BloggerCourse.com  Fact Sheet for Healthcare Providers: SeriousBroker.it  This test is not yet approved or cleared by the [place]  FDA and has been authorized for detection and/or diagnosis of SARS-CoV-2 by FDA under an Emergency Use Authorization (EUA). This EUA will remain in effect (meaning this test can be used) for the duration of the COVID-19 declaration under Section 564(b)(1) of the Act, 21 U.S.C. section 360bbb-3(b)(1), unless the authorization is terminated or revoked.     Resp Syncytial Virus by PCR NEGATIVE NEGATIVE Final    Comment: (NOTE) Fact Sheet for Patients: BloggerCourse.com  Fact Sheet for Healthcare Providers: SeriousBroker.it  This test is not yet approved or cleared by the [place]  FDA and has been authorized for detection and/or diagnosis of SARS-CoV-2 by FDA under an Emergency Use Authorization (EUA). This EUA will remain in effect (meaning this test can be used) for the duration of the COVID-19 declaration under Section 564(b)(1) of the Act, 21 U.S.C. section 360bbb-3(b)(1), unless the  authorization is terminated or revoked.  Performed at [facility], [address] [place], [place] [address]     Coagulation Studies: No results for input(s): LABPROT, INR in the last 72 hours.  Urinalysis: No results for input(s): COLORURINE, LABSPEC, PHURINE, GLUCOSEU, HGBUR, BILIRUBINUR, KETONESUR, PROTEINUR, UROBILINOGEN, NITRITE, LEUKOCYTESUR in the last 72 hours.  Invalid input(s): APPERANCEUR    Imaging: CT Angio Chest PE W/Cm &/Or Wo Cm Result Date: 04/22/2024 CLINICAL DATA:  Pulmonary embolus suspected with high probability. Dialysis today with subsequent shortness of breath. EXAM: CT ANGIOGRAPHY CHEST WITH CONTRAST TECHNIQUE: Multidetector CT imaging of the chest was performed using the standard protocol during bolus administration of intravenous contrast. Multiplanar CT image reconstructions and MIPs were obtained to evaluate the vascular anatomy. RADIATION DOSE REDUCTION: This exam was performed according to the departmental dose-optimization program which includes automated exposure control, adjustment of the mA and/or kV according to patient size and/or use of iterative reconstruction technique. CONTRAST:  75mL OMNIPAQUE  IOHEXOL  350 MG/ML SOLN COMPARISON:  Chest radiograph 04/22/2024 FINDINGS: Cardiovascular: Technically adequate study with good opacification of the central and segmental pulmonary arteries. Moderate motion artifact. No focal filling defects. No evidence of significant pulmonary embolus. Cardiac enlargement. Normal caliber thoracic aorta. No aortic dissection. Great vessel origins are patent. Mediastinum/Nodes: Esophagus is decompressed. Small esophageal hiatal hernia behind the heart. Thyroid gland is unremarkable. Scattered lymph nodes in the mediastinum are not pathologically enlarged.  Lungs/Pleura: Minimal bilateral pleural effusions. Emphysematous changes in the lungs. Peripheral interstitial fibrosis with prominent  honeycomb changes particularly in the bases. This may represent usual interstitial pneumonitis. Suggestion of hazy ground-glass opacities throughout the lungs which may indicate edema or active alveolitis. No pneumothorax. Upper Abdomen: No acute abnormalities. Musculoskeletal: Degenerative changes in the spine with bridging anterior osteophytes. No acute bony abnormalities. Review of the MIP images confirms the above findings. IMPRESSION: 1. No evidence of significant pulmonary embolus. 2. Cardiac enlargement. 3. Emphysematous changes in the lungs. Peripheral fibrosis with prominent honeycomb changes suggesting chronic interstitial lung disease. 4. Ground-glass opacities throughout the lungs may indicate edema or active alveolitis. Electronically Signed   By: Elsie Gravely M.D.   On: 04/22/2024 23:00   DG Chest Port 1 View Result Date: 04/22/2024 CLINICAL DATA:  [id number] SOB (shortness of breath) [id number] EXAM: PORTABLE CHEST 1 VIEW COMPARISON:  Chest x-ray 06/13/2022 FINDINGS: The heart and mediastinal contours are unchanged. No focal consolidation. Chronic coarsened interstitial marking with no overt pulmonary edema. Nonspecific blunting of bilateral costophrenic angles. No significant pleural effusion. No pneumothorax. No acute osseous abnormality. Left axillary vascular stent. IMPRESSION: No active disease. Underlying pulmonary fibrosis/interstitial lung disease. Electronically Signed   By: Morgane  Naveau M.D.   On: 04/22/2024 20:41     Medications:     amLODipine   5 mg Oral Daily   aspirin  EC  81 mg Oral Daily   atorvastatin   40 mg Oral QHS   Chlorhexidine  Gluconate Cloth  6 each Topical Q0600   guaiFENesin   600 mg Oral BID   heparin   5,000 Units Subcutaneous Q8H   lisinopril   40 mg Oral Daily   predniSONE   40 mg Oral Q breakfast   acetaminophen  **OR** acetaminophen , albuterol , ondansetron  **OR** ondansetron  (ZOFRAN ) IV  Assessment/ Plan:  Ms. Krista Gordon is a 83 y.o.  female with  past medical history of anemia, diabetes, and end stage renal disease on hemodialysis. Patient presents to ED with shortness of breath and cough. Patient will be admitted for Shortness of breath [R06.02] Bronchitis [J40] COPD exacerbation (HCC) [J44.1] COPD with acute exacerbation (HCC) [J44.1] Acute hypoxemic respiratory failure (HCC) [J96.01]   End-stage renal disease on hemodialysis.  Patient received UF only treatment yesterday with 1.5 L achieved.  Patient will receive scheduled dialysis today to maintain outpatient schedule.  2.  Acute respiratory failure, suspected COPD with acute bronchitis, chest x-ray negative for acute findings.  CT angio chest shows presence of interstitial lung disease.  Patient was increased to 4 L for short period yesterday but has been returned to 2 L this morning.  3. Anemia of chronic kidney disease Lab Results  Component Value Date   HGB 9.1 (L) 04/23/2024    Hemoglobin within optimal range.  4. Secondary Hyperparathyroidism: with outpatient labs:  Lab Results  Component Value Date   CALCIUM  8.6 (L) 04/23/2024   CAION 1.05 (L) 06/05/2022   PHOS 2.8 04/23/2024    Will continue to monitor bone minerals   LOS: 1 Krista Gordon 7/10/20251:01 PM

## 2024-04-24 NOTE — Plan of Care (Signed)
   Problem: Education: Goal: Knowledge of disease or condition will improve Outcome: Progressing Goal: Knowledge of the prescribed therapeutic regimen will improve Outcome: Progressing Goal: Individualized Educational Video(s) Outcome: Progressing   Problem: Activity: Goal: Ability to tolerate increased activity will improve Outcome: Progressing Goal: Will verbalize the importance of balancing activity with adequate rest periods Outcome: Progressing   Problem: Respiratory: Goal: Ability to maintain a clear airway will improve Outcome: Progressing Goal: Levels of oxygenation will improve Outcome: Progressing Goal: Ability to maintain adequate ventilation will improve Outcome: Progressing   Problem: Education: Goal: Knowledge of General Education information will improve Description: Including pain rating scale, medication(s)/side effects and non-pharmacologic comfort measures Outcome: Progressing   Problem: Health Behavior/Discharge Planning: Goal: Ability to manage health-related needs will improve Outcome: Progressing   Problem: Clinical Measurements: Goal: Ability to maintain clinical measurements within normal limits will improve Outcome: Progressing Goal: Will remain free from infection Outcome: Progressing Goal: Diagnostic test results will improve Outcome: Progressing Goal: Respiratory complications will improve Outcome: Progressing Goal: Cardiovascular complication will be avoided Outcome: Progressing   Problem: Activity: Goal: Risk for activity intolerance will decrease Outcome: Progressing   Problem: Nutrition: Goal: Adequate nutrition will be maintained Outcome: Progressing   Problem: Coping: Goal: Level of anxiety will decrease Outcome: Progressing   Problem: Elimination: Goal: Will not experience complications related to bowel motility Outcome: Progressing Goal: Will not experience complications related to urinary retention Outcome: Progressing    Problem: Pain Managment: Goal: General experience of comfort will improve and/or be controlled Outcome: Progressing   Problem: Safety: Goal: Ability to remain free from injury will improve Outcome: Progressing   Problem: Skin Integrity: Goal: Risk for impaired skin integrity will decrease Outcome: Progressing

## 2024-04-24 NOTE — Plan of Care (Signed)
  Problem: Education: Goal: Knowledge of disease or condition will improve Outcome: Adequate for Discharge Goal: Knowledge of the prescribed therapeutic regimen will improve Outcome: Adequate for Discharge Goal: Individualized Educational Video(s) Outcome: Adequate for Discharge   Problem: Activity: Goal: Ability to tolerate increased activity will improve Outcome: Adequate for Discharge Goal: Will verbalize the importance of balancing activity with adequate rest periods Outcome: Adequate for Discharge   Problem: Respiratory: Goal: Ability to maintain a clear airway will improve Outcome: Adequate for Discharge Goal: Levels of oxygenation will improve Outcome: Adequate for Discharge Goal: Ability to maintain adequate ventilation will improve Outcome: Adequate for Discharge   Problem: Education: Goal: Knowledge of General Education information will improve Description: Including pain rating scale, medication(s)/side effects and non-pharmacologic comfort measures Outcome: Adequate for Discharge   Problem: Health Behavior/Discharge Planning: Goal: Ability to manage health-related needs will improve Outcome: Adequate for Discharge   Problem: Clinical Measurements: Goal: Ability to maintain clinical measurements within normal limits will improve Outcome: Adequate for Discharge Goal: Will remain free from infection Outcome: Adequate for Discharge Goal: Diagnostic test results will improve Outcome: Adequate for Discharge Goal: Respiratory complications will improve Outcome: Adequate for Discharge Goal: Cardiovascular complication will be avoided Outcome: Adequate for Discharge   Problem: Activity: Goal: Risk for activity intolerance will decrease Outcome: Adequate for Discharge   Problem: Nutrition: Goal: Adequate nutrition will be maintained Outcome: Adequate for Discharge   Problem: Coping: Goal: Level of anxiety will decrease Outcome: Adequate for Discharge    Problem: Elimination: Goal: Will not experience complications related to bowel motility Outcome: Adequate for Discharge Goal: Will not experience complications related to urinary retention Outcome: Adequate for Discharge   Problem: Pain Managment: Goal: General experience of comfort will improve and/or be controlled Outcome: Adequate for Discharge   Problem: Safety: Goal: Ability to remain free from injury will improve Outcome: Adequate for Discharge   Problem: Skin Integrity: Goal: Risk for impaired skin integrity will decrease Outcome: Adequate for Discharge

## 2024-04-24 NOTE — Progress Notes (Signed)
 Hemodialysis Note:  Received patient in bed to unit. Alert and oriented. Informed consent singed and in chart.  Treatment initiated: 1334 Treatment completed: 1715  Access used: Left AVF Access issues: None  Patient tolerated well. Transported back to room, alert without acute distress. Report given to patient's RN.  Total UF removed: 1 liter Medications given: None  Post HD weight: 56 Kg  [facility]

## 2024-04-24 NOTE — Discharge Summary (Signed)
 Physician Discharge Summary   Patient: Krista Gordon MRN: [id number] DOB: [birth year]  Admit date:     04/22/2024  Discharge date: 04/24/24  Discharge Physician: Murvin Mana   PCP: Buren Rock HERO, MD   Recommendations at discharge:   Follow-up with PCP in 1 week  Discharge Diagnoses: Principal Problem:   COPD with acute bronchitis (HCC) Active Problems:   Acute on chronic respiratory failure with hypoxia (HCC)   Interstitial lung disease (HCC)   ESRD on hemodialysis (HCC)   Elevated troponin   Diabetes (HCC)   Essential hypertension   Anemia of chronic disease   COPD exacerbation (HCC)   Severe pulmonary hypertension (HCC)  Resolved Problems:   * No resolved hospital problems. *  Hospital Course:  [name] is a 83 y.o. 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   COPD exacerbation, placed on steroids.  Condition much improved today, medically stable for discharge.  Assessment and Plan: * COPD with acute bronchitis (HCC) Acute on chronic respiratory failure with hypoxia Interstitial lung disease Severe pulmonary hypertension. Patient was tachycardic and hypoxic to 85 on arrival.  Uses O2 at home as needed only Patient had echocardiogram performed in 2023, showed severe pulmonary hypertension with normal ejection fraction. Patient is continued on steroids and scheduled bronchodilator.   Condition has improved, back on 2 L oxygen.  Medically stable for discharge.   Elevated troponin secondary to demand ischemia Likely demand ischemia, has basically flat 53-59 No complaints of chest pain and EKG nonacute   ESRD on hemodialysis (HCC) Anemia of chronic kidney disease. Patient will be dialyzed again today placed on scheduled    Essential hypertension On home medicines   Diabetes (HCC) Resume home treatment       Consultants: Nephrology Procedures performed: HD  Disposition: Home Diet recommendation:  Discharge Diet Orders (From admission, onward)     Start     Ordered   04/24/24 0000  Diet general       Comments: Renal diet   04/24/24 1043           Renal diet DISCHARGE MEDICATION: Allergies as of 04/24/2024       Reactions   Mircera [methoxy Polyethylene Glycol-epoetin Beta]    Patient doesn't recall this allergy   Tramadol  Itching        Medication List     STOP taking these medications    estradiol  0.1 MG/GM vaginal cream Commonly known as: ESTRACE  VAGINAL   pantoprazole  40 MG tablet Commonly known as: Protonix    ProAir  HFA 108 (90 Base) MCG/ACT inhaler Generic drug: albuterol  Replaced by: albuterol  (2.5 MG/3ML) 0.083% nebulizer solution       TAKE these medications    albuterol  (2.5 MG/3ML) 0.083% nebulizer solution Commonly known as: PROVENTIL  Take 3 mLs (2.5 mg total) by nebulization every 2 (two) hours as needed for shortness of breath. Replaces: ProAir  HFA 108 (90 Base) MCG/ACT inhaler   alendronate  70 MG tablet Commonly known as: FOSAMAX  Take 70 mg by mouth once a week.   amLODipine  5 MG tablet Commonly known as: NORVASC  Take 5 mg by mouth daily.   aspirin  EC 81 MG tablet Take 1 tablet (81 mg total) by mouth daily. Swallow whole.   atorvastatin  40 MG tablet Commonly known as: LIPITOR  Take 40 mg by mouth at bedtime.   colchicine  0.6 MG tablet Take 0.6 mg by mouth every other day.   lisinopril  40 MG tablet Commonly known as: ZESTRIL  Take 40 mg by mouth daily.   loratadine 10 MG tablet Commonly known as: CLARITIN Take 10 mg by mouth every other day.   oxyCODONE -acetaminophen  5-325 MG tablet Commonly known as: Percocet Take 1 tablet by mouth every 4 (four) hours as needed for severe pain.   predniSONE  20 MG tablet Commonly known as:  DELTASONE  Take 2 tablets (40 mg total) by mouth daily with breakfast for 2 days, THEN 1 tablet (20 mg total) daily with breakfast for 3 days, THEN 0.5 tablets (10 mg total) daily with breakfast for 3 days. Start taking on: April 25, 2024   sodium bicarbonate  650 MG tablet Take 1,300 mg by mouth 2 (two) times daily.               Durable Medical Equipment  (From admission, onward)           Start     Ordered   04/24/24 1040  For home use only DME Nebulizer machine  Once       Question Answer Comment  Patient needs a nebulizer to treat with the following condition COPD exacerbation (HCC)   Length of Need 6 Months   Additional equipment included Administration kit   Additional equipment included Filter      04/24/24 1040            Follow-up Information     Buren Rock HERO, MD Follow up in 1 week(s).   Specialty: Family Medicine Contact information: [address] [place] [address] [phone]                Discharge Exam: Fredricka Weights   04/22/24 2002 04/23/24 1730  Weight: 59 kg 56.2 kg   General exam: Appears calm and comfortable  Respiratory system: Decreased breath sounds. Respiratory effort normal. Cardiovascular system: S1 & S2 heard, RRR. No JVD, murmurs, rubs, gallops or clicks. No pedal edema. Gastrointestinal system: Abdomen is nondistended, soft and nontender. No organomegaly or masses felt. Normal bowel sounds heard. Central nervous system: Alert and oriented. No focal neurological deficits. Extremities: Symmetric 5 x 5 power. Skin: No rashes, lesions or ulcers Psychiatry: Judgement and insight appear normal. Mood & affect appropriate.    Condition at discharge: good  The results of significant diagnostics from this hospitalization (including imaging, microbiology, ancillary and laboratory) are listed below for reference.   Imaging Studies: CT Angio Chest PE W/Cm &/Or Wo Cm Result Date: 04/22/2024 CLINICAL DATA:  Pulmonary  embolus suspected with high probability. Dialysis today with subsequent shortness of breath. EXAM: CT ANGIOGRAPHY CHEST WITH CONTRAST TECHNIQUE: Multidetector CT imaging of the chest was performed using the standard protocol during bolus administration of intravenous contrast. Multiplanar CT image reconstructions and MIPs were obtained to evaluate the vascular anatomy. RADIATION DOSE REDUCTION: This exam was performed according to the departmental dose-optimization program which includes automated exposure control, adjustment of the mA and/or kV according to patient size and/or use of iterative reconstruction technique. CONTRAST:  75mL OMNIPAQUE  IOHEXOL  350 MG/ML SOLN COMPARISON:  Chest radiograph 04/22/2024 FINDINGS: Cardiovascular: Technically adequate study with good opacification of the central and segmental pulmonary arteries. Moderate motion artifact. No focal filling defects. No evidence of significant pulmonary embolus. Cardiac enlargement. Normal caliber thoracic aorta. No aortic dissection. Great vessel origins are patent. Mediastinum/Nodes: Esophagus is decompressed. Small esophageal hiatal hernia  behind the heart. Thyroid gland is unremarkable. Scattered lymph nodes in the mediastinum are not pathologically enlarged. Lungs/Pleura: Minimal bilateral pleural effusions. Emphysematous changes in the lungs. Peripheral interstitial fibrosis with prominent honeycomb changes particularly in the bases. This may represent usual interstitial pneumonitis. Suggestion of hazy ground-glass opacities throughout the lungs which may indicate edema or active alveolitis. No pneumothorax. Upper Abdomen: No acute abnormalities. Musculoskeletal: Degenerative changes in the spine with bridging anterior osteophytes. No acute bony abnormalities. Review of the MIP images confirms the above findings. IMPRESSION: 1. No evidence of significant pulmonary embolus. 2. Cardiac enlargement. 3. Emphysematous changes in the lungs.  Peripheral fibrosis with prominent honeycomb changes suggesting chronic interstitial lung disease. 4. Ground-glass opacities throughout the lungs may indicate edema or active alveolitis. Electronically Signed   By: Elsie Gravely M.D.   On: 04/22/2024 23:00   DG Chest Port 1 View Result Date: 04/22/2024 CLINICAL DATA:  [id number] SOB (shortness of breath) [id number] EXAM: PORTABLE CHEST 1 VIEW COMPARISON:  Chest x-ray 06/13/2022 FINDINGS: The heart and mediastinal contours are unchanged. No focal consolidation. Chronic coarsened interstitial marking with no overt pulmonary edema. Nonspecific blunting of bilateral costophrenic angles. No significant pleural effusion. No pneumothorax. No acute osseous abnormality. Left axillary vascular stent. IMPRESSION: No active disease. Underlying pulmonary fibrosis/interstitial lung disease. Electronically Signed   By: Morgane  Naveau M.D.   On: 04/22/2024 20:41    Microbiology: Results for orders placed or performed during the hospital encounter of 04/22/24  Resp panel by RT-PCR (RSV, Flu A&B, Covid) Anterior Nasal Swab     Status: None   Collection Time: 04/22/24 10:42 PM   Specimen: Anterior Nasal Swab  Result Value Ref Range Status   SARS Coronavirus 2 by RT PCR NEGATIVE NEGATIVE Final    Comment: (NOTE) SARS-CoV-2 target nucleic acids are NOT DETECTED.  The SARS-CoV-2 RNA is generally detectable in upper respiratory specimens during the acute phase of infection. The lowest concentration of SARS-CoV-2 viral copies this assay can detect is 138 copies/mL. A negative result does not preclude SARS-Cov-2 infection and should not be used as the sole basis for treatment or other patient management decisions. A negative result may occur with  improper specimen collection/handling, submission of specimen other than nasopharyngeal swab, presence of viral mutation(s) within the areas targeted by this assay, and inadequate number of viral copies(<138 copies/mL). A  negative result must be combined with clinical observations, patient history, and epidemiological information. The expected result is Negative.  Fact Sheet for Patients:  BloggerCourse.com  Fact Sheet for Healthcare Providers:  SeriousBroker.it  This test is no t yet approved or cleared by the [place]  FDA and  has been authorized for detection and/or diagnosis of SARS-CoV-2 by FDA under an Emergency Use Authorization (EUA). This EUA will remain  in effect (meaning this test can be used) for the duration of the COVID-19 declaration under Section 564(b)(1) of the Act, 21 U.S.C.section 360bbb-3(b)(1), unless the authorization is terminated  or revoked sooner.       Influenza A by PCR NEGATIVE NEGATIVE Final   Influenza B by PCR NEGATIVE NEGATIVE Final    Comment: (NOTE) The Xpert Xpress SARS-CoV-2/FLU/RSV plus assay is intended as an aid in the diagnosis of influenza from Nasopharyngeal swab specimens and should not be used as a sole basis for treatment. Nasal washings and aspirates are unacceptable for Xpert Xpress SARS-CoV-2/FLU/RSV testing.  Fact Sheet for Patients: BloggerCourse.com  Fact Sheet for Healthcare Providers: SeriousBroker.it  This test is not yet approved or cleared  by the [place]  FDA and has been authorized for detection and/or diagnosis of SARS-CoV-2 by FDA under an Emergency Use Authorization (EUA). This EUA will remain in effect (meaning this test can be used) for the duration of the COVID-19 declaration under Section 564(b)(1) of the Act, 21 U.S.C. section 360bbb-3(b)(1), unless the authorization is terminated or revoked.     Resp Syncytial Virus by PCR NEGATIVE NEGATIVE Final    Comment: (NOTE) Fact Sheet for Patients: BloggerCourse.com  Fact Sheet for Healthcare  Providers: SeriousBroker.it  This test is not yet approved or cleared by the [place]  FDA and has been authorized for detection and/or diagnosis of SARS-CoV-2 by FDA under an Emergency Use Authorization (EUA). This EUA will remain in effect (meaning this test can be used) for the duration of the COVID-19 declaration under Section 564(b)(1) of the Act, 21 U.S.C. section 360bbb-3(b)(1), unless the authorization is terminated or revoked.  Performed at [facility], [address] [place], [place] [address]     Labs: CBC: Recent Labs  Lab 04/22/24 2005 04/23/24 1745  WBC 8.0 6.2  HGB 10.3* 9.1*  HCT 34.0* 29.3*  MCV 99.4 100.0  PLT 133* 99*   Basic Metabolic Panel: Recent Labs  Lab 04/22/24 2005 04/23/24 1745  NA 137 135  K 4.5 4.4  CL 100 101  CO2 28 23  GLUCOSE 152* 210*  BUN 18 25*  CREATININE 3.97* 5.08*  CALCIUM  9.1 8.6*  PHOS  --  2.8   Liver Function Tests: Recent Labs  Lab 04/23/24 1745  ALBUMIN  2.9*   CBG: No results for input(s): GLUCAP in the last 168 hours.  Discharge time spent: greater than 30 minutes.  Signed: Murvin Mana, MD [facility] 04/24/2024

## 2024-04-25 LAB — HEPATITIS B SURFACE ANTIBODY, QUANTITATIVE: Hep B S AB Quant (Post): <3.5 m[IU]/mL — ABNORMAL LOW

## 2024-10-30 ENCOUNTER — Other Ambulatory Visit: Payer: Self-pay

## 2024-10-30 ENCOUNTER — Emergency Department

## 2024-10-30 ENCOUNTER — Encounter: Payer: Self-pay | Admitting: Emergency Medicine

## 2024-10-30 ENCOUNTER — Emergency Department: Admission: EM | Admit: 2024-10-30 | Discharge: 2024-10-30 | Disposition: A | Discharge status: Home / Self Care

## 2024-10-30 DIAGNOSIS — J449 Chronic obstructive pulmonary disease, unspecified: Secondary | ICD-10-CM | POA: Insufficient documentation

## 2024-10-30 DIAGNOSIS — R531 Weakness: Secondary | ICD-10-CM

## 2024-10-30 DIAGNOSIS — N186 End stage renal disease: Secondary | ICD-10-CM | POA: Insufficient documentation

## 2024-10-30 DIAGNOSIS — Z992 Dependence on renal dialysis: Secondary | ICD-10-CM | POA: Insufficient documentation

## 2024-10-30 DIAGNOSIS — J189 Pneumonia, unspecified organism: Secondary | ICD-10-CM | POA: Insufficient documentation

## 2024-10-30 DIAGNOSIS — I120 Hypertensive chronic kidney disease with stage 5 chronic kidney disease or end stage renal disease: Secondary | ICD-10-CM | POA: Insufficient documentation

## 2024-10-30 DIAGNOSIS — E1122 Type 2 diabetes mellitus with diabetic chronic kidney disease: Secondary | ICD-10-CM | POA: Insufficient documentation

## 2024-10-30 LAB — COMPREHENSIVE METABOLIC PANEL WITH GFR
ALT: 9 U/L (ref 0–44)
AST: 25 U/L (ref 15–41)
Albumin: 3.9 g/dL (ref 3.5–5.0)
Alkaline Phosphatase: 104 U/L (ref 38–126)
Anion gap: 17 — ABNORMAL HIGH (ref 5–15)
BUN: 19 mg/dL (ref 8–23)
CO2: 27 mmol/L (ref 22–32)
Calcium: 10.2 mg/dL (ref 8.9–10.3)
Chloride: 96 mmol/L — ABNORMAL LOW (ref 98–111)
Creatinine, Ser: 6.26 mg/dL — ABNORMAL HIGH (ref 0.44–1.00)
GFR, Estimated: 6 mL/min — ABNORMAL LOW
Glucose, Bld: 73 mg/dL (ref 70–99)
Potassium: 4.1 mmol/L (ref 3.5–5.1)
Sodium: 140 mmol/L (ref 135–145)
Total Bilirubin: 0.6 mg/dL (ref 0.0–1.2)
Total Protein: 7.8 g/dL (ref 6.5–8.1)

## 2024-10-30 LAB — CBC
HCT: 33.6 % — ABNORMAL LOW (ref 36.0–46.0)
Hemoglobin: 10.5 g/dL — ABNORMAL LOW (ref 12.0–15.0)
MCH: 30.3 pg (ref 26.0–34.0)
MCHC: 31.3 g/dL (ref 30.0–36.0)
MCV: 96.8 fL (ref 80.0–100.0)
Platelets: 140 K/uL — ABNORMAL LOW (ref 150–400)
RBC: 3.47 MIL/uL — ABNORMAL LOW (ref 3.87–5.11)
RDW: 15.9 % — ABNORMAL HIGH (ref 11.5–15.5)
WBC: 6.5 K/uL (ref 4.0–10.5)
nRBC: 0 % (ref 0.0–0.2)

## 2024-10-30 LAB — RESP PANEL BY RT-PCR (RSV, FLU A&B, COVID)  RVPGX2
Influenza A by PCR: NEGATIVE
Influenza B by PCR: NEGATIVE
Resp Syncytial Virus by PCR: NEGATIVE
SARS Coronavirus 2 by RT PCR: NEGATIVE

## 2024-10-30 LAB — TROPONIN T, HIGH SENSITIVITY
Troponin T High Sensitivity: 79 ng/L — ABNORMAL HIGH (ref 0–19)
Troponin T High Sensitivity: 78 ng/L — ABNORMAL HIGH (ref 0–19)

## 2024-10-30 MED ORDER — DOXYCYCLINE HYCLATE 100 MG PO TABS: 100.0000 mg | ORAL_TABLET | Freq: Two times a day (BID) | ORAL | 0 refills | Status: AC

## 2024-10-30 MED ORDER — ACETAMINOPHEN 325 MG PO TABS
650.0000 mg | ORAL_TABLET | Freq: Once | ORAL | Status: AC
  Administered 2024-10-30: 650 mg via ORAL
  Filled 2024-10-30: qty 2

## 2024-10-30 NOTE — ED Provider Notes (Signed)
 "  [facility] Provider Note    Event Date/Time   First MD Initiated Contact with Patient 10/30/24 [phone]     (approximate)   History   Weakness   HPI  Krista Gordon is a 84 y.o. female with a past medical history of ESRD currently on dialysis, COPD, anemia, hypertension, hyperlipidemia, diabetes, presenting to the emergency department via EMS from her dialysis appointment for weakness over the last 2 days.  Patient reports that she has been having diarrhea and that everything just goes through her so she has not been eating well.  She also reports feeling short of breath.  She denies any cough, congestion, chest pain, abdominal pain, nausea, vomiting, or leg swelling.  She reports that she had her normal dialysis appointment on Tuesday but that she did not receive any treatment today.     Physical Exam   Triage Vital Signs: ED Triage Vitals  Encounter Vitals Group     BP 10/30/24 0646 105/62     Girls Systolic BP Percentile --      Girls Diastolic BP Percentile --      Boys Systolic BP Percentile --      Boys Diastolic BP Percentile --      Pulse Rate 10/30/24 0646 88     Resp 10/30/24 0646 16     Temp 10/30/24 0646 97.7 F (36.5 C)     Temp Source 10/30/24 0646 Oral     SpO2 10/30/24 0646 94 %     Weight 10/30/24 0658 125 lb (56.7 kg)     Height 10/30/24 0658 5' 2 (1.575 m)     Head Circumference --      Peak Flow --      Pain Score 10/30/24 0658 0     Pain Loc --      Pain Education --      Exclude from Growth Chart --     Most recent vital signs: Vitals:   10/30/24 0646  BP: 105/62  Pulse: 88  Resp: 16  Temp: 97.7 F (36.5 C)  SpO2: 94%     General: Awake, no distress.  CV:  Good peripheral perfusion.  Resp:  Normal effort.  Abd:  No distention.  Other:     ED Results / Procedures / Treatments   Labs (all labs ordered are listed, but only abnormal results are displayed) Labs Reviewed  CBC - Abnormal; Notable for the  following components:      Result Value   RBC 3.47 (*)    Hemoglobin 10.5 (*)    HCT 33.6 (*)    RDW 15.9 (*)    Platelets 140 (*)    All other components within normal limits  RESP PANEL BY RT-PCR (RSV, FLU A&B, COVID)  [id number]  COMPREHENSIVE METABOLIC PANEL WITH GFR  URINALYSIS, ROUTINE W REFLEX MICROSCOPIC  TROPONIN T, HIGH SENSITIVITY     EKG  ED ECG REPORT I, Reche CHRISTELLA Leventhal, the attending physician, personally viewed and interpreted this ECG.  Date: 10/30/2024 Rate: 88 bpm Rhythm: normal sinus rhythm QRS Axis: Left axis deviation Intervals: normal ST/T Wave abnormalities: normal Narrative Interpretation: no evidence of acute ischemia    RADIOLOGY I, Reche CHRISTELLA Leventhal, personally viewed and evaluated these images (plain radiographs) as part of my medical decision making, as well as reviewing the written report by the radiologist.  IMPRESSION: Hypoinflation with chronic interstitial changes compatible with known underlying pulmonary fibrosis. Subtle patchy hazy density superimposed on the underlying  fibrosis which may represent progression of disease/alveolitis versus infection.    PROCEDURES:  Critical Care performed: No  Procedures   MEDICATIONS ORDERED IN ED: Medications - No data to display   IMPRESSION / MDM / ASSESSMENT AND PLAN / ED COURSE  I reviewed the triage vital signs and the nursing notes.                              Differential includes, but is not limited to, viral syndrome, bronchitis including COPD exacerbation, pneumonia, reactive airway disease including asthma, CHF including exacerbation with or without pulmonary/interstitial edema, pneumothorax, ACS, thoracic trauma, and pulmonary embolism.   Patient's presentation is most consistent with acute illness / injury with system symptoms.  Patient is a 84 y.o. female with a past medical history of ESRD currently on dialysis, COPD, anemia, hypertension, hyperlipidemia, diabetes,  presenting to the emergency department via EMS from her dialysis appointment for weakness over the last 2 days.  EKG, x-ray, and lab work ordered for further evaluation.  The patient's blood work all appears to be at her baseline.  Potassium is 4.1.  EKG does not show any acute findings.  Troponin is slightly elevated at 79 and 78.  Viral panel is negative.  On chest x-ray there are hazy patches superimposed on underlying fibrosis which may be concerning for infection.  Given the patient's complaints she will be treated with doxycycline  for pneumonia.  I discussed results with the patient and her family.  Discussed plan for discharge with instructions to follow-up with her dialysis center as well as her primary care physician.  She will return immediately to the emergency department for any new or worsening symptoms.      FINAL CLINICAL IMPRESSION(S) / ED DIAGNOSES   Final diagnoses:  Weakness  Pneumonia due to infectious organism, unspecified laterality, unspecified part of lung     Rx / DC Orders   ED Discharge Orders          Ordered    doxycycline  (VIBRA -TABS) 100 MG tablet  2 times daily        10/30/24 1018             Note:  This document was prepared using Dragon voice recognition software and may include unintentional dictation errors.   [place] Reche HERO, MD 10/30/24 1037  "

## 2024-10-30 NOTE — ED Triage Notes (Signed)
 Pt arrives via EMS from dialysis for c/o weakness last 2 days and lack of appetite, SHOB; 84% on arrival on ra; pt denies any recent illness, denies any pain
